# Patient Record
Sex: Female | Born: 1945 | Race: White | Hispanic: No | Marital: Married | Smoking: Former smoker
Health system: Southern US, Community
[De-identification: ages and names within clinical notes are randomized; demographics above are authoritative.]

## PROBLEM LIST (undated history)

## (undated) DIAGNOSIS — G4730 Sleep apnea, unspecified: Secondary | ICD-10-CM

## (undated) DIAGNOSIS — M1990 Unspecified osteoarthritis, unspecified site: Secondary | ICD-10-CM

## (undated) DIAGNOSIS — K5792 Diverticulitis of intestine, part unspecified, without perforation or abscess without bleeding: Secondary | ICD-10-CM

## (undated) DIAGNOSIS — I10 Essential (primary) hypertension: Secondary | ICD-10-CM

## (undated) DIAGNOSIS — J189 Pneumonia, unspecified organism: Secondary | ICD-10-CM

## (undated) DIAGNOSIS — E049 Nontoxic goiter, unspecified: Secondary | ICD-10-CM

## (undated) DIAGNOSIS — C50919 Malignant neoplasm of unspecified site of unspecified female breast: Secondary | ICD-10-CM

## (undated) DIAGNOSIS — D369 Benign neoplasm, unspecified site: Secondary | ICD-10-CM

## (undated) DIAGNOSIS — F99 Mental disorder, not otherwise specified: Secondary | ICD-10-CM

## (undated) DIAGNOSIS — E039 Hypothyroidism, unspecified: Secondary | ICD-10-CM

## (undated) DIAGNOSIS — K219 Gastro-esophageal reflux disease without esophagitis: Secondary | ICD-10-CM

## (undated) HISTORY — DX: Malignant neoplasm of unspecified site of unspecified female breast: C50.919

## (undated) HISTORY — DX: Nontoxic goiter, unspecified: E04.9

## (undated) HISTORY — DX: Diverticulitis of intestine, part unspecified, without perforation or abscess without bleeding: K57.92

## (undated) HISTORY — DX: Benign neoplasm, unspecified site: D36.9

## (undated) HISTORY — PX: CHOLECYSTECTOMY: SHX55

## (undated) HISTORY — PX: MASTECTOMY: SHX3

## (undated) HISTORY — PX: BREAST SURGERY: SHX581

## (undated) HISTORY — PX: ABDOMINAL HYSTERECTOMY: SHX81

## (undated) HISTORY — PX: DILATION AND CURETTAGE OF UTERUS: SHX78

## (undated) HISTORY — PX: HERNIA REPAIR: SHX51

## (undated) HISTORY — DX: Essential (primary) hypertension: I10

## (undated) HISTORY — DX: Gastro-esophageal reflux disease without esophagitis: K21.9

## (undated) HISTORY — DX: Mental disorder, not otherwise specified: F99

## (undated) HISTORY — PX: PARTIAL HYSTERECTOMY: SHX80

---

## 2001-08-20 ENCOUNTER — Other Ambulatory Visit: Admission: RE | Admit: 2001-08-20 | Discharge: 2001-08-20 | Payer: Self-pay | Admitting: Dermatology

## 2002-06-13 DIAGNOSIS — C50919 Malignant neoplasm of unspecified site of unspecified female breast: Secondary | ICD-10-CM

## 2002-06-13 HISTORY — DX: Malignant neoplasm of unspecified site of unspecified female breast: C50.919

## 2002-06-13 HISTORY — PX: OTHER SURGICAL HISTORY: SHX169

## 2002-09-04 ENCOUNTER — Encounter (INDEPENDENT_AMBULATORY_CARE_PROVIDER_SITE_OTHER): Payer: Self-pay | Admitting: *Deleted

## 2002-09-04 ENCOUNTER — Encounter: Admission: RE | Admit: 2002-09-04 | Discharge: 2002-09-04 | Payer: Self-pay | Admitting: Internal Medicine

## 2002-09-04 ENCOUNTER — Other Ambulatory Visit: Admission: RE | Admit: 2002-09-04 | Discharge: 2002-09-04 | Payer: Self-pay | Admitting: Radiology

## 2002-09-04 ENCOUNTER — Encounter: Payer: Self-pay | Admitting: Internal Medicine

## 2002-09-16 ENCOUNTER — Encounter (HOSPITAL_COMMUNITY): Admission: RE | Admit: 2002-09-16 | Discharge: 2002-12-15 | Payer: Self-pay | Admitting: *Deleted

## 2002-09-16 ENCOUNTER — Encounter: Payer: Self-pay | Admitting: *Deleted

## 2002-09-17 ENCOUNTER — Encounter: Payer: Self-pay | Admitting: *Deleted

## 2002-10-04 ENCOUNTER — Ambulatory Visit (HOSPITAL_BASED_OUTPATIENT_CLINIC_OR_DEPARTMENT_OTHER): Admission: RE | Admit: 2002-10-04 | Discharge: 2002-10-04 | Payer: Self-pay | Admitting: *Deleted

## 2002-10-04 ENCOUNTER — Encounter: Admission: RE | Admit: 2002-10-04 | Discharge: 2002-10-04 | Payer: Self-pay | Admitting: *Deleted

## 2002-10-04 ENCOUNTER — Encounter (INDEPENDENT_AMBULATORY_CARE_PROVIDER_SITE_OTHER): Payer: Self-pay | Admitting: *Deleted

## 2002-10-04 ENCOUNTER — Encounter: Payer: Self-pay | Admitting: *Deleted

## 2002-10-22 ENCOUNTER — Ambulatory Visit: Admission: RE | Admit: 2002-10-22 | Discharge: 2002-12-04 | Payer: Self-pay | Admitting: *Deleted

## 2002-10-25 ENCOUNTER — Ambulatory Visit: Admission: RE | Admit: 2002-10-25 | Discharge: 2002-10-25 | Payer: Self-pay | Admitting: Oncology

## 2002-10-25 ENCOUNTER — Encounter: Payer: Self-pay | Admitting: Cardiology

## 2002-10-31 ENCOUNTER — Encounter: Payer: Self-pay | Admitting: General Surgery

## 2002-10-31 ENCOUNTER — Ambulatory Visit (HOSPITAL_BASED_OUTPATIENT_CLINIC_OR_DEPARTMENT_OTHER): Admission: RE | Admit: 2002-10-31 | Discharge: 2002-10-31 | Payer: Self-pay | Admitting: General Surgery

## 2003-05-16 ENCOUNTER — Inpatient Hospital Stay (HOSPITAL_COMMUNITY): Admission: RE | Admit: 2003-05-16 | Discharge: 2003-05-17 | Payer: Self-pay | Admitting: General Surgery

## 2003-05-16 ENCOUNTER — Encounter (INDEPENDENT_AMBULATORY_CARE_PROVIDER_SITE_OTHER): Payer: Self-pay | Admitting: Specialist

## 2003-12-22 ENCOUNTER — Encounter: Admission: RE | Admit: 2003-12-22 | Discharge: 2003-12-22 | Payer: Self-pay | Admitting: Neurosurgery

## 2004-01-06 ENCOUNTER — Encounter: Admission: RE | Admit: 2004-01-06 | Discharge: 2004-01-06 | Payer: Self-pay | Admitting: Neurosurgery

## 2004-07-27 ENCOUNTER — Encounter: Admission: RE | Admit: 2004-07-27 | Discharge: 2004-07-27 | Payer: Self-pay | Admitting: General Surgery

## 2004-08-05 ENCOUNTER — Encounter: Admission: RE | Admit: 2004-08-05 | Discharge: 2004-08-05 | Payer: Self-pay | Admitting: General Surgery

## 2006-01-27 ENCOUNTER — Ambulatory Visit: Payer: Self-pay | Admitting: Cardiology

## 2006-06-13 HISTORY — PX: ESOPHAGOGASTRODUODENOSCOPY: SHX1529

## 2006-06-13 HISTORY — PX: COLONOSCOPY: SHX174

## 2006-09-22 ENCOUNTER — Ambulatory Visit: Payer: Self-pay | Admitting: Internal Medicine

## 2006-09-22 ENCOUNTER — Ambulatory Visit (HOSPITAL_COMMUNITY): Admission: RE | Admit: 2006-09-22 | Discharge: 2006-09-22 | Payer: Self-pay | Admitting: Internal Medicine

## 2009-06-16 ENCOUNTER — Encounter (INDEPENDENT_AMBULATORY_CARE_PROVIDER_SITE_OTHER): Payer: Self-pay | Admitting: *Deleted

## 2009-07-30 ENCOUNTER — Emergency Department (HOSPITAL_COMMUNITY): Admission: EM | Admit: 2009-07-30 | Discharge: 2009-07-30 | Payer: Self-pay | Admitting: Emergency Medicine

## 2010-07-13 NOTE — Letter (Signed)
Summary: 1st Missed Appt.  [facility]  [address]   [place], [place] [address]   Phone: [phone]  Fax: [phone]    June 16, 2009  MRN: [id number]  Karen [name] [address] [place], [place]  [address]  Dear [name],  At [facility], we make every attempt to fit patients into our schedule by reserving several appointment slots for same-day appointments.  However, we cannot always make appointments for patients the same day they are calling.  At the end of the day, we look back at our schedule and find that because of last-minute cancellations and patients not showing up for their scheduled appointments, we have several appointment slots that are left open and could have been used by another person who really needed it.  In the past, you may have been one of the patients who could not get in when you needed to.  But recently, you were one of the patients with an appointment that you didn't show up for or canceled too late for [place] to fill it.  We choose not to charge no-show or last minute cancellation fees to our patients, like many other offices do.  We do not wish to institute that policy and hope we never have to.  However, we kindly request that you assist [place] by providing at least 24 hours' notice if you can't make your appointment.  If no-shows or late cancellations become habitual (i.e. Three or more in a one-year period), we may terminate the physician-patient relationship.    Thank you for your consideration and cooperation.   Altamease Oiler

## 2010-08-16 ENCOUNTER — Emergency Department (HOSPITAL_COMMUNITY): Payer: Self-pay

## 2010-08-16 ENCOUNTER — Emergency Department (HOSPITAL_COMMUNITY)
Admission: EM | Admit: 2010-08-16 | Discharge: 2010-08-16 | Disposition: A | Discharge status: Home / Self Care | Payer: Self-pay | Attending: Emergency Medicine | Admitting: Emergency Medicine

## 2010-08-16 DIAGNOSIS — E039 Hypothyroidism, unspecified: Secondary | ICD-10-CM | POA: Insufficient documentation

## 2010-08-16 DIAGNOSIS — R197 Diarrhea, unspecified: Secondary | ICD-10-CM | POA: Insufficient documentation

## 2010-08-16 DIAGNOSIS — K5732 Diverticulitis of large intestine without perforation or abscess without bleeding: Secondary | ICD-10-CM | POA: Insufficient documentation

## 2010-08-16 DIAGNOSIS — Z853 Personal history of malignant neoplasm of breast: Secondary | ICD-10-CM | POA: Insufficient documentation

## 2010-08-16 DIAGNOSIS — Z79899 Other long term (current) drug therapy: Secondary | ICD-10-CM | POA: Insufficient documentation

## 2010-08-16 DIAGNOSIS — I10 Essential (primary) hypertension: Secondary | ICD-10-CM | POA: Insufficient documentation

## 2010-08-16 DIAGNOSIS — R110 Nausea: Secondary | ICD-10-CM | POA: Insufficient documentation

## 2010-08-16 LAB — CBC
HCT: 37.3 % (ref 36.0–46.0)
Hemoglobin: 12.4 g/dL (ref 12.0–15.0)
MCH: 31 pg (ref 26.0–34.0)
MCHC: 33.2 g/dL (ref 30.0–36.0)
MCV: 93.3 fL (ref 78.0–100.0)
RBC: 4 MIL/uL (ref 3.87–5.11)

## 2010-08-16 LAB — URINALYSIS, ROUTINE W REFLEX MICROSCOPIC
Glucose, UA: NEGATIVE mg/dL
Hgb urine dipstick: NEGATIVE
Ketones, ur: NEGATIVE mg/dL
Nitrite: NEGATIVE
Protein, ur: NEGATIVE mg/dL
Specific Gravity, Urine: >1.03 — ABNORMAL HIGH (ref 1.005–1.030)
Urobilinogen, UA: 0.2 mg/dL (ref 0.0–1.0)
pH: 5.5 (ref 5.0–8.0)

## 2010-08-16 LAB — COMPREHENSIVE METABOLIC PANEL
Albumin: 3.8 g/dL (ref 3.5–5.2)
Alkaline Phosphatase: 111 U/L (ref 39–117)
BUN: 14 mg/dL (ref 6–23)
Calcium: 9.3 mg/dL (ref 8.4–10.5)
Glucose, Bld: 95 mg/dL (ref 70–99)
Potassium: 3.3 mEq/L — ABNORMAL LOW (ref 3.5–5.1)
Sodium: 138 mEq/L (ref 135–145)
Total Protein: 7.5 g/dL (ref 6.0–8.3)

## 2010-08-16 LAB — DIFFERENTIAL
Lymphs Abs: 2.3 10*3/uL (ref 0.7–4.0)
Monocytes Absolute: 0.8 10*3/uL (ref 0.1–1.0)
Monocytes Relative: 8 % (ref 3–12)
Neutro Abs: 7.3 10*3/uL (ref 1.7–7.7)
Neutrophils Relative %: 69 % (ref 43–77)

## 2010-08-16 MED ORDER — IOHEXOL 300 MG/ML  SOLN
100.0000 mL | Freq: Once | INTRAMUSCULAR | Status: AC | PRN
  Administered 2010-08-16: 100 mL via INTRAVENOUS

## 2010-08-30 ENCOUNTER — Encounter: Payer: Self-pay | Admitting: Gastroenterology

## 2010-08-30 ENCOUNTER — Ambulatory Visit (INDEPENDENT_AMBULATORY_CARE_PROVIDER_SITE_OTHER): Payer: Self-pay | Admitting: Gastroenterology

## 2010-08-30 DIAGNOSIS — K219 Gastro-esophageal reflux disease without esophagitis: Secondary | ICD-10-CM

## 2010-08-30 DIAGNOSIS — K5732 Diverticulitis of large intestine without perforation or abscess without bleeding: Secondary | ICD-10-CM

## 2010-09-01 LAB — BASIC METABOLIC PANEL
BUN: 13 mg/dL (ref 6–23)
CO2: 32 mEq/L (ref 19–32)
Calcium: 8.9 mg/dL (ref 8.4–10.5)
Chloride: 99 mEq/L (ref 96–112)
Creatinine, Ser: 0.65 mg/dL (ref 0.4–1.2)
Glucose, Bld: 110 mg/dL — ABNORMAL HIGH (ref 70–99)

## 2010-09-01 LAB — DIFFERENTIAL
Basophils Absolute: 0 10*3/uL (ref 0.0–0.1)
Basophils Relative: 1 % (ref 0–1)
Eosinophils Absolute: 0 10*3/uL (ref 0.0–0.7)
Monocytes Relative: 13 % — ABNORMAL HIGH (ref 3–12)
Neutro Abs: 1.7 10*3/uL (ref 1.7–7.7)
Neutrophils Relative %: 47 % (ref 43–77)

## 2010-09-01 LAB — CBC
MCHC: 34.6 g/dL (ref 30.0–36.0)
MCV: 93.8 fL (ref 78.0–100.0)
Platelets: 169 10*3/uL (ref 150–400)
RBC: 4.15 MIL/uL (ref 3.87–5.11)
RDW: 13.3 % (ref 11.5–15.5)

## 2010-09-03 ENCOUNTER — Encounter: Payer: Self-pay | Admitting: Gastroenterology

## 2010-09-09 NOTE — Assessment & Plan Note (Signed)
Summary: ER REFERRAL FOR DIVERTICULITIS   Vital Signs:  Patient profile:   64 year old female Height:      63 inches Weight:      197.75 pounds BMI:     35.16 Temp:     98.6 degrees F oral Pulse rate:   80 / minute BP sitting:   126 / 86  (right arm)  Vitals Entered By: Carolan Clines LPN (August 30, 2010 1:59 PM)  Visit Type:  Initial Consult Referring Provider:  [facility] Primary Care Provider:  Dr. Sherryll Burger   History of Present Illness: Karen [name] is a pleasant 65 year old Caucasian female who presents today in f/u from diverticulitis, diagnosed at [facility] ED on 08/16/10. She has completed a course of Cipro/Flagyl X 10 days. Pt reports feeling better, pain has lessened in severity. +belching X 1 week. +GERD. Not on a PPI. Occasional dysphagia with tougher textures X 1 year. No epigastric pain. Denies fever/chills.  BMs are loose. ranging from 2-4X per day. No more melena or brbpr since starting abx.   Last EGD/TCS April 2008: lipoma distal esophagus, gastritis, colon with few diverticula in sigmoid colon  Current Medications (verified): 1)  Verapamil Hcl Cr 180 Mg Cr-Tabs (Verapamil Hcl) .... Take One Once Daily 2)  Enalapril-Hydrochlorothiazide 10-25 Mg Tabs (Enalapril-Hydrochlorothiazide) .... Take One Once Daily 3)  Hydrocodone-Acetaminophen 5-325 Mg Tabs (Hydrocodone-Acetaminophen) .... Take One Q4-6hrs As Needed For Pain 4)  Ondansetron Hcl 8 Mg Tabs (Ondansetron Hcl) .... Dissolve One Under Tongue Qid As Needed  Allergies (verified): 1)  ! * Sulfa  Past History:  Past Medical History: GERD HTN Goiter Breast ca 2004  EGD/TCS April 2008: lipoma distal esophagus, gastritis, colon with few diverticula in sigmoid colon  Past Surgical History: Partial hysterectomy Bilateral mastectomy with reconstruction  Family History: Mother: deceased, pancreatic ca Father: deceased, aortic aneurysm No FH of Colon Cancer:  Social History: Occupation: Retired Married X 50 years 3  children Patient is a former smoker. quit 15 years ago Alcohol Use - no Smoking Status:  quit  Review of Systems General:  Denies fever, chills, and anorexia. Eyes:  Denies blurring, irritation, and discharge. ENT:  Complains of difficulty swallowing; denies sore throat and hoarseness. CV:  Denies chest pains and syncope. Resp:  Denies dyspnea at rest and wheezing. GI:  See HPI. GU:  Denies urinary burning and urinary frequency. MS:  Denies joint pain / LOM, joint swelling, and joint stiffness. Derm:  Denies rash, itching, and dry skin. Neuro:  Denies weakness and syncope. Psych:  Denies depression and anxiety. Endo:  Denies cold intolerance and heat intolerance. Heme:  Denies bruising and bleeding.  Physical Exam  General:  Well developed, well nourished, no acute distress. Head:  Normocephalic and atraumatic. Eyes:  PERRLA, no icterus. Mouth:  No deformity or lesions, dentition normal. Lungs:  expiratory wheeze bilaterally. no rales or rhonchi. unlabored.  Heart:  Regular rate and rhythm; no murmurs, rubs,  or bruits. Abdomen:  +BS, obese. mildly TTP LLQ. No rebound or guarding. no HSM noted. no masses noted. Msk:  Symmetrical with no gross deformities. Normal posture. Extremities:  No clubbing, cyanosis, edema or deformities noted. Neurologic:  Alert and  oriented x4;  grossly normal neurologically. Skin:  Intact without significant lesions or rashes. Psych:  Alert and cooperative. Normal mood and affect.   Impression & Recommendations:  Problem # 1:  DIVERTICULITIS-COLON (ICD-562.11)  1st documented episode of diverticulitis August 16, 2010. Finished course of Cipro/Flagyl, with significant improvement of  symptoms. No fever/chills. 2-4 loose stools per day. No melena or brbpr since ED visit. Last colonoscopy in 2008 with sigmoid diverticula.   Continue low-residue diet X 1 week, then advance to high-fiber diet F/U in 3-4 weeks to schedule outpatient colonoscopy Avoid  constipation, use stool softeners as needed  Orders: Consultation Level III (13244)  Problem # 2:  GERD (ICD-530.81)  Hx of GERD, gastritis, not currently on PPI. +GERD with intermittent dysphagia X 1 year. May be due to uncontrolled GERD. Will trial Dexilant. When returns for visit, will visit need for upper EGD as well with TCS.   Dexilant # 10 samples with savings card and prescription F/U in 3-4 weeks Consider upper EGD if symptoms continue despite Dexilant  Orders: Consultation Level III (01027)  Patient Instructions: 1)  Follow low-residue diet for an additional week, then advance to high-fiber diet.  2)  Avoid constipation. May use stool softeners as needed.  3)  Return in 3-4 weeks to set up colonoscopy to evaluate lower GI tract. 4)  Start Dexilant 60 mg daily. For reflux. Samples have been given for 10 days. Activate savings card, THEN fill prescription. 5)  The medication list was reviewed and reconciled.  All changed / newly prescribed medications were explained.  A complete medication list was provided to the patient / caregiver.  Prescriptions: DEXILANT 60 MG CPDR (DEXLANSOPRAZOLE) take 1 by mouth daily  #31 x 3   Entered and Authorized by:   Gerrit Halls NP   Signed by:   Gerrit Halls NP on 08/30/2010   Method used:   Print then Give to Patient   RxID:   [id number]    Orders Added: 1)  Consultation Level III [63875]  Appended Document: ER REFERRAL FOR DIVERTICULITIS 3-4 wks f/u opv is in the computer

## 2010-09-27 ENCOUNTER — Ambulatory Visit: Payer: Self-pay | Admitting: Gastroenterology

## 2010-10-26 ENCOUNTER — Encounter: Payer: Self-pay | Admitting: Gastroenterology

## 2010-10-27 ENCOUNTER — Ambulatory Visit: Payer: Self-pay | Admitting: Gastroenterology

## 2010-10-29 NOTE — Op Note (Signed)
NAMEJAENA, Karen [name]                      ACCOUNT NO.:  [id number]   MEDICAL RECORD NO.:  [id number]                   PATIENT TYPE:  INP   LOCATION:  0443                                 FACILITY:  [facility]   PHYSICIAN:  Timothy E. Earlene Plater, M.D.              DATE OF BIRTH:  26-Feb-1946   DATE OF PROCEDURE:  05/16/2003  DATE OF DISCHARGE:                                 OPERATIVE REPORT   PREOPERATIVE DIAGNOSES:  Carcinoma left breast, strong family history of  breast cancer, placement of Port-A-Cath.   POSTOPERATIVE DIAGNOSES:  Carcinoma left breast, strong family history of  breast cancer, placement of Port-A-Cath.   PROCEDURE:  Removal of Port-A-Cath, bilateral simple mastectomy.   SURGEON:  Timothy E. Earlene Plater, M.D.   ASSISTANT:  Abigail Miyamoto, M.D.   INDICATIONS FOR PROCEDURE:  Karen [name] is 42, was diagnosed earlier this year  with left breast cancer, stage 2A with positive nodes.  She has completed  chemotherapy. Because of two sisters and aunt with breast cancer, she has  decided that she would have bilateral mastectomies.  Dr. Margo Common, her  primary care, Dr. Orvilla Fus price and myself have all agreed.  She will have  reconstruction later with Dr. Stephens November.  The patient has a history of  hypertension and reflux and obesity, low thyroid.  She was seen, evaluated  by anesthesia, identified and the permit signed.   Her laboratory data today were satisfactory.   DESCRIPTION OF PROCEDURE:  She was taken to the operating room, placed  supine, positioned herself with arms outstretched and then general  endotracheal anesthesia was administered.  The chest had been previously  marked in the upright position with the midline, the sternal notch, previous  biopsy and lobectomy site left upper outer quadrant.  Now the breast was  again marked midline, inframammary folds, horizontal line of the nipples and  previous biopsies.  The ellipse of the left breast was designed to enclose  the scar from previous lumpectomy.  The right ellipse was designed to match  the left.  Both were designed as short horizontal incisions to allow as much  redundant skin remain as possible.  This having been done, the chest was  prepped and draped in the usual fashion.  We started with the right breast  and the elliptical incision was made including the nipple. Flaps were raised  in each direction and I did encounter the Port-A-Cath pocket at the top of  the right breast tissue.  I therefore entered the pocket and removed the  Port-A-Cath and sutures at this time.  The margins of the breast were  ascertained during this dissection superior, inferior, medial and lateral  and then the breast was removed from medial to lateral with care taken to  leave the prepectoral fascia.  This was accomplished with removal of all the  breast tissue.  The breast was marked and sent to pathology for weight and  histologic analysis.   The left breast was now approached with the designed ellipse as noted above.  That ellipse was incised and then flaps were raised in all directions as  previously.  The entire limits of the breast were reached and this did  include the lower portion of the prior axillary dissection.  The prior  lumpectomy site was not entered. The breast was removed from medial to  lateral. At the depth of the previous lumpectomy site the scar tissue in the  prepectoral fascia and fascial muscle were removed with the specimen.  Likewise this breast was marked and sent to pathology.  Bleeding was  controlled throughout with cautery and/or 4-0 Vicryl sutures. Both wounds  were dry, irrigation was clear.  All counts were correct.  One single 10 mm  flat drain was placed in each wound and brought out through the inferior  stab wounds and these were sutured the skin.  The wounds were then closed  with a single layer with wide skin staples.  She had tolerated it well,  blood loss was approximately  100 mL and she was stable to completion.  With  the counts correct, bandages applied, the patient was extubated and taken to  the recovery room in good condition.                                               Timothy E. Earlene Plater, M.D.    TED/MEDQ  D:  05/16/2003  T:  05/16/2003  Job:  [id number]   cc:   Lynett Fish, M.D.  [address] [place]  [place] [address]  Fax: [phone]

## 2010-10-29 NOTE — Op Note (Signed)
Karen [name], Karen [name]                      ACCOUNT NO.:  [id number]   MEDICAL RECORD NO.:  [id number]                   PATIENT TYPE:  AMB   LOCATION:  DSC                                  FACILITY:  [facility]   PHYSICIAN:  Maisie Fus B. Samuella Cota, M.D.               DATE OF BIRTH:  1945/12/15   DATE OF PROCEDURE:  10/04/2002  DATE OF DISCHARGE:  10/04/2002                                 OPERATIVE REPORT   CCS# [id number].   PREOPERATIVE DIAGNOSIS:  Invasive ductal carcinoma, left breast.   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   OPERATION PERFORMED:  Needle localization and partial mastectomy, left,  injection of blue dye, sentinel lymph node dissection, full left axillary  lymph node dissection.   SURGEON:  Maisie Fus B. Samuella Cota, M.D.   ANESTHESIA:  General. Anesthesiologist and CRNA.   DESCRIPTION OF PROCEDURE:  The patient was taken to the operating room and  placed on the table in supine position.  After satisfactory general  anesthetic with intubation, the left breast and axilla was prepped and  draped as a sterile field.  Prior to induction of general anesthetic  approximately 3mL of isosulfan blue was injected in the subareolar area of  the left breast and massaged for about five minutes.  The patient had a wire  coming through the skin at about the 2 o'clock position which was pointing  medially.  An area over the breast at the 11:30 position was marked.  The  wire was going through a tumor which was 2.3 cm deep to the nipple and  areola.  The gamma probe was placed over the left axilla and a hot spot  noted.  This was marked and a transverse incision in the skin crease of the  lower axilla.  This dissection was taken down through skin and subcutaneous  tissue.  The pectoralis major and minor muscles were retracted medially and  there was a hot blue node noted.  This was dissected free with  bleeders  being controlled with the cautery or with Hemoclips.  This was removed and  placed over the gamma probe and it was quite hot.  The probe was placed back  into the wound and there was another hot node which was dissected free.  This also was a fairly small node, only about 1 cm in size and was hot but  was not blue.  Following removal of these two nodes, there was no further  activity in the axilla.  The wound was packed open and the two sentinel  nodes were sent for Touch Preps.  While this was being done a transverse  incision was outlined with a skin marker so as to remove the opening in the  skin made by the wire.  This was the lateral border of the incision which  extended across to about the 11  o'clock area.  A small ellipse of skin,  perhaps 4 to 5 mm in diameter was removed with the specimen.  The dissection  was taken down to the chest wall and was taken across the 12 o'clock  position to about the 11 o'clock position.  This was sent to the pathologist  for implants.  Bleeding was controlled with the cautery or with sutures of 3-  0 Vicryl.  This wound was then packed open.  At about this time, the  pathologist reported that the first sentinel lymph node which was hot and  blue showed malignant cells.  With this information, the axillary incision  was extended both medially and laterally and axillary dissection carried  out.  The pectoralis major and minor muscles were retracted medially and the  axilla entered.  The vessels coming off inferior to the axillary vein were  doubly clipped and divided.  The long thoracic nerve of Bell and  thoracodorsal artery, vein and nerve were seen and preserved.  The patient  did have some fairly firm nodes in the axilla.  The resection was taken out  laterally where it was divided off of the latissimus dorsi muscle.  The  wound was irrigated.  Hemostasis was obtained.  At this point the  pathologist reported that the imprints on the  breast tissue itself showed  good margins except a questionable margin inferiorly.  With that  information, I went back and removed tissue inferior to my incision  extending from the skin down to the chest wall.  The wound was then  irrigated and hemostasis obtained.  The breast incision was closed with  subcuticular sutures of 3-0 Vicryl.  The skin was closed with skin staples.  A #19 Blake drain was inserted into the left axillary wound through a stab  wound inferior and lateral to the axillary incision.  It was sutured to the  skin using 3-0 nylon.  Subcutaneous tissue was approximated with 3-0 Vicryl  and the skin was closed also with skin staples.  A piece of Vaseline gauze  was placed over the suture line in the axilla and also around the [place]  drain.  Dry sterile dressing using 4 x 4s, ABD and four-inch HypaFix was  applied.  The drain was connected to bulb suction.  The patient seemed to  tolerate the procedure well and was taken to the PACU in satisfactory  condition.                                                Thomas B. Samuella Cota, M.D.    TBP/MEDQ  D:  10/04/2002  T:  10/07/2002  Job:  [id number]   cc:   Kirstie Peri  [address]  [place] [address]  Fax: [phone]

## 2010-10-29 NOTE — Op Note (Signed)
[name], [name]                 ACCOUNT NO.:  [id number]   MEDICAL RECORD NO.:  [id number]          PATIENT TYPE:  AMB   LOCATION:  DAY                           FACILITY:  [facility]   PHYSICIAN:  Lionel December, M.D.    DATE OF BIRTH:  1946/05/05   DATE OF PROCEDURE:  09/22/2006  DATE OF DISCHARGE:                               OPERATIVE REPORT   PROCEDURE:  Esophagogastroduodenoscopy followed by colonoscopy.   Karen [name] is a 65 year old Caucasian female who has a several year history  of reflux esophagitis controlled with a PPI.  She is undergoing EGD to  make sure she does not have Barrett's esophagus.  She is also undergoing  average-risk screening colonoscopy.  Last exam was 10 years ago.  Family  history is significant for breast carcinoma and she also has had  bilateral mastectomy and remains in remission.  The procedure and risks  were reviewed with the patient and informed consent was obtained.   MEDS FOR CONSCIOUS SEDATION:  1. Benzocaine for oropharyngeal topical anesthesia.  2. Demerol 50 mg IV.  3. Versed 8 mg IV.   FINDINGS:  Procedure performed in endoscopy suite.  The patient's vital  signs and O2 sat were monitored during the procedure and remained  stable.   PROCEDURE:  1. Esophagogastroduodenoscopy.  The patient was placed in the left      lateral recumbent position and the Pentax video scope was passed      into the oropharynx without any difficulty into the esophagus.   Esophagus - mucosa of the esophagus normal.  Distally, there was a 5 mm  submucosal lesion just proximal to the GE junction suspicious for small  lipoma.  The GE junction was at 38 cm from the incisors and was  unremarkable.   Stomach - it was empty and distended very well with insufflation.  Folds  of the proximal stomach are normal.  There was one pill in her stomach,  though.  Examination of the mucosa revealed linear erythema at antrum  with acute tiny erosions.  The pyloric channel was  patent.  Annulus,  fundus and cardiac were examined by retroflexion of the scope and were  normal.   Duodenum - bulbar mucosa was normal.  Scope was passed to the second  part of the jejunum.  Mucosa and folds are normal.  Endoscope was  withdrawn and the patient was prepared for procedure #2.   1. Colonoscopy.  A rectal examination was performed.  No abnormality      noted on external or digital exam.  Pentax video scope was placed      in the rectum and advanced visually into the sigmoid colon and      beyond.  She had few diverticula in the proximal sigmoid colon.      Preparation was satisfactory except she had some stool in her      ascending colon, which had to be washed and suctioned out.  The      scope was passed into the cecum, which was identified by the      appendiceal  orifice and the ileocecal valve.  Pictures were taken      for the record.  As the scope was withdrawn, colonic mucosa was      carefully examined and there were no polyps or other mucosal      abnormalities.  Rectal mucosa similarly was normal.  The scope was      retroflexed to examine anorectal junction.  There was some      thickening to mucosa of the anal canal but no hemorrhoids noted.      Endoscope was straightened and withdrawn.  The patient tolerated      the procedure well.   DIAGNOSIS:  1. A 5 mm submucosal lesion in the distal esophagus, consistent with      lipoma.  2. No evidence of Barrett's esophagus or erosive esophagitis.  3. Erosive antral gastritis.  4. Normal colonoscopy except few diverticula at sigmoid colon.   RECOMMENDATIONS:  1. She will continue antireflux measures and Prilosec, as before.  2. H. pylori serology to be checked.  3. High fiber diet.  4. She should continue yearly hemoccults and consider next screening      exam in 10 years from now.      Lionel December, M.D.  Electronically Signed     NR/MEDQ  D:  09/22/2006  T:  09/22/2006  Job:  [id number]   cc:    Ruthy Dick  Fax: [phone]

## 2010-10-29 NOTE — Op Note (Signed)
NAMEKAYDANCE, BOWIE                      ACCOUNT NO.:  [id number]   MEDICAL RECORD NO.:  [id number]                   PATIENT TYPE:  AMB   LOCATION:  DSC                                  FACILITY:  [facility]   PHYSICIAN:  Timothy E. Earlene Plater, M.D.              DATE OF BIRTH:  07-03-1945   DATE OF PROCEDURE:  10/31/2002  DATE OF DISCHARGE:                                 OPERATIVE REPORT   PREOPERATIVE DIAGNOSIS:  Carcinoma, left breast.   POSTOPERATIVE DIAGNOSIS:  Carcinoma, left breast.   PROCEDURE:  Placement of Port-A-Cath.   SURGEON:  Timothy E. Earlene Plater, M.D.   ANESTHESIA:  Local standby.   CLINICAL HISTORY:  The patient was recently history by Maisie Fus B. Samuella Cota, M.D.  She is 66, a Caucasian female, with carcinoma of the left breast.  Has  decided because of such strong family history that she will have bilateral  mastectomies and reconstruction but prior to that she will receive  chemotherapy, Valentino Hue. Magrinat, M.D.  For that she will need a Port-A-  Cath, and she agrees and understands with the placement.   DESCRIPTION OF PROCEDURE:  She was taken to the operating room, placed  supine, with the mid-back supported, IV sedation given.  She was placed in  the head-down position and the upper chest and neck were prepped and draped  in the usual fashion.  Marcaine 0.25% with epinephrine was used prior to  each stick or incision.  The right subclavian vein was isolated on the first  pass, a guidewire threaded, and all during the procedure real-time  fluoroscopy used for accurate placement.  The inserting needle was removed.  The placement skin mark was enlarged, and the introduction sheath was placed  over the guidewire.  Guidewire removed.  The catheter to the Port-A-Cath  device threaded through the sheath and the sheath removed.  Again  fluoroscopy used for accurate placement.  Good position was obtained.  The  patient was brought up to the neutral position and then with  additional  local anesthesia, a pocket was made in the mid-right chest for placement of  port.  Catheter was subcutaneously tunneled to the port and pectorally  connected to the port.  It flushed and irrigated well.  The port sutured to  the prepectoral fascia with 2-0 Prolene.  The wounds were all closed with 3-  0 Monocryl.  Steri-Strips applied.  The port isolated percutaneously,  flushed and irrigated well, and then flushed with concentrated heparin  solution.  Steri-Strips and  OpSite dressing applied and the accurate puncture mark marked on the skin.  She tolerated it well, counts correct, and she is removed to the recovery  room in good condition.  A portable chest x-ray will be made and forwarded  to Dr. Darnelle Catalan, and the patient will be seen and followed as an outpatient.  Timothy E. Earlene Plater, M.D.    TED/MEDQ  D:  10/31/2002  T:  11/01/2002  Job:  [id number]   cc:   Valentino Hue. Magrinat, M.D.  [address] [facility]  [place]  [place] [address]  Fax: [phone]

## 2011-01-24 ENCOUNTER — Encounter: Payer: Self-pay | Admitting: Gastroenterology

## 2011-01-24 ENCOUNTER — Ambulatory Visit (HOSPITAL_COMMUNITY)
Admission: RE | Admit: 2011-01-24 | Discharge: 2011-01-24 | Disposition: A | Discharge status: Home / Self Care | Payer: Medicare Other | Source: Ambulatory Visit | Attending: Gastroenterology | Admitting: Gastroenterology

## 2011-01-24 ENCOUNTER — Encounter (HOSPITAL_COMMUNITY): Payer: Self-pay

## 2011-01-24 ENCOUNTER — Ambulatory Visit (INDEPENDENT_AMBULATORY_CARE_PROVIDER_SITE_OTHER): Payer: Medicare Other | Admitting: Gastroenterology

## 2011-01-24 VITALS — BP 141/90 | HR 73 | Temp 96.6°F | Ht 62.0 in | Wt 205.8 lb

## 2011-01-24 DIAGNOSIS — R1310 Dysphagia, unspecified: Secondary | ICD-10-CM | POA: Insufficient documentation

## 2011-01-24 DIAGNOSIS — K5732 Diverticulitis of large intestine without perforation or abscess without bleeding: Secondary | ICD-10-CM

## 2011-01-24 DIAGNOSIS — R110 Nausea: Secondary | ICD-10-CM | POA: Insufficient documentation

## 2011-01-24 DIAGNOSIS — R1032 Left lower quadrant pain: Secondary | ICD-10-CM | POA: Insufficient documentation

## 2011-01-24 DIAGNOSIS — K219 Gastro-esophageal reflux disease without esophagitis: Secondary | ICD-10-CM | POA: Insufficient documentation

## 2011-01-24 DIAGNOSIS — K7689 Other specified diseases of liver: Secondary | ICD-10-CM | POA: Insufficient documentation

## 2011-01-24 DIAGNOSIS — Q619 Cystic kidney disease, unspecified: Secondary | ICD-10-CM | POA: Insufficient documentation

## 2011-01-24 DIAGNOSIS — I10 Essential (primary) hypertension: Secondary | ICD-10-CM | POA: Insufficient documentation

## 2011-01-24 DIAGNOSIS — K5730 Diverticulosis of large intestine without perforation or abscess without bleeding: Secondary | ICD-10-CM | POA: Insufficient documentation

## 2011-01-24 DIAGNOSIS — Z853 Personal history of malignant neoplasm of breast: Secondary | ICD-10-CM | POA: Insufficient documentation

## 2011-01-24 DIAGNOSIS — K429 Umbilical hernia without obstruction or gangrene: Secondary | ICD-10-CM | POA: Insufficient documentation

## 2011-01-24 DIAGNOSIS — K625 Hemorrhage of anus and rectum: Secondary | ICD-10-CM | POA: Insufficient documentation

## 2011-01-24 DIAGNOSIS — R197 Diarrhea, unspecified: Secondary | ICD-10-CM | POA: Insufficient documentation

## 2011-01-24 LAB — BASIC METABOLIC PANEL
Chloride: 99 mEq/L (ref 96–112)
Potassium: 3.5 mEq/L (ref 3.5–5.3)

## 2011-01-24 LAB — CBC WITH DIFFERENTIAL/PLATELET
Basophils Absolute: 0.1 10*3/uL (ref 0.0–0.1)
Eosinophils Absolute: 0.2 10*3/uL (ref 0.0–0.7)
Eosinophils Relative: 2 % (ref 0–5)
Lymphocytes Relative: 20 % (ref 12–46)
MCH: 31.7 pg (ref 26.0–34.0)
MCV: 95.5 fL (ref 78.0–100.0)
Neutrophils Relative %: 71 % (ref 43–77)
Platelets: 277 10*3/uL (ref 150–400)
RDW: 14.3 % (ref 11.5–15.5)
WBC: 10.9 10*3/uL — ABNORMAL HIGH (ref 4.0–10.5)

## 2011-01-24 MED ORDER — IOHEXOL 300 MG/ML  SOLN
100.0000 mL | Freq: Once | INTRAMUSCULAR | Status: AC | PRN
  Administered 2011-01-24: 100 mL via INTRAVENOUS

## 2011-01-24 NOTE — Progress Notes (Signed)
Quick Note:  Discussed with RMR. Suspect resolving diverticulitis. No ongoing evidence of inflammation or infection on CT. No evidence of acute umbilical hernia issues. Finish antibiotics. If pain does not resolve, let us know.  Several foci in liver, seen previously, likely benign. Just to be safe, given history of breast cancer, recommend MRI liver with Eovist. Discussed with Dr. Jena Gauss.   TCS/EGD/ED in 3 weeks with Dr. Jena Gauss. ______

## 2011-01-24 NOTE — Progress Notes (Signed)
Primary Care Physician:  Kirstie Peri, MD  Primary Gastroenterologist:  Roetta Sessions, MD   Chief Complaint  Patient presents with  . Lower left abdominal pain    HPI:  Karen [name] is a 65 y.o. female here for further evaluation of recurrent diverticulitis. First bout was in 08/2010, documented on CT. Uncomplicated course. She presents with another bout of diverticulitis. Started two weeks ago. Saw Dr. Sherryll Burger and was started on Cipro/Flagyl. Almost completed antibiotics. Still with LLQ pain. No improvement since on antibiotics. Worse at night. BM soft. Sometimes pain followed by brbpr, small amount. Noticed in toilet and on tissue. Sometimes pass just blood. Subjective fever at night. Some nausea but no vomiting. Eating liquids and soft foods. Taking Dexilant only prn for heartburn. Still has dysphagia. At first she took Dexilant regularly but now prn.     Current Outpatient Prescriptions  Medication Sig Dispense Refill  . ciprofloxacin (CIPRO) 500 MG tablet Take 500 mg by mouth 2 (two) times daily.        Marland Kitchen HYDROcodone-acetaminophen (NORCO) 5-325 MG per tablet Take 1 tablet by mouth every 6 (six) hours as needed.        Marland Kitchen lisinopril-hydrochlorothiazide (PRINZIDE,ZESTORETIC) 20-25 MG per tablet Take 1 tablet by mouth daily.        . metroNIDAZOLE (FLAGYL) 500 MG tablet Take 500 mg by mouth 3 (three) times daily.        . ondansetron (ZOFRAN-ODT) 8 MG disintegrating tablet Take 8 mg by mouth every 8 (eight) hours as needed.        . verapamil (CALAN-SR) 180 MG CR tablet Take 180 mg by mouth at bedtime.        Marland Kitchen dexlansoprazole (DEXILANT) 60 MG capsule Take 60 mg by mouth daily.            Allergies as of 01/24/2011 - Review Complete 01/24/2011  Allergen Reaction Noted  . Sulfonamide derivatives  08/30/2010    Past Medical History  Diagnosis Date  . GERD (gastroesophageal reflux disease)   . HTN (hypertension)   . Goiter   . Breast cancer 2004    bilateral mastectomy-all left lymph  nodes removed; chemo. Patient states she had "spot" on the right breast as well.    Past Surgical History  Procedure Date  . Esophagogastroduodenoscopy 2008    lipoma distal esophagus, gastritis   . Colonoscopy 2008    colon with few diverticula in sigmoid colon  . Partial hysterectomy   . Bilateral mastectomy w/reconstruction 2004    Family History  Problem Relation Age of Onset  . Pancreatic cancer Mother     deceased  . Aneurysm Father     deceased, aortic  . Colon cancer Neg Hx   . Breast cancer Sister     deceased 8 years after initial diagnosis  . Breast cancer Sister     survivor  . Diverticulitis Brother     s/p partial colon resection    History   Social History  . Marital Status: Single    Spouse Name: N/A    Number of Children: 3  . Years of Education: N/A   Occupational History  . retired    Social History Main Topics  . Smoking status: Former Games developer  . Smokeless tobacco: Not on file   Comment: Quit 1997  . Alcohol Use: No  . Drug Use: No  . Sexually Active: Not on file      ROS:  General: Negative for anorexia, weight loss, chills, fatigue,  weakness. Eyes: Negative for vision changes.  ENT: Negative for hoarseness, nasal congestion. CV: Negative for chest pain, angina, palpitations, dyspnea on exertion, peripheral edema.  Respiratory: Negative for dyspnea at rest, dyspnea on exertion, cough, sputum, wheezing.  GI: See history of present illness. C/O ongoing dysphagia to solid foods. GU:  Negative for dysuria, hematuria, urinary incontinence, urinary frequency, nocturnal urination.  MS: Negative for joint pain, low back pain.  Derm: Negative for rash or itching.  Neuro: Negative for weakness, abnormal sensation, seizure, frequent headaches, memory loss, confusion.  Psych: Negative for anxiety, depression, suicidal ideation, hallucinations.  Endo: Negative for unusual weight change.  Heme: Negative for bruising or bleeding. Allergy: Negative  for rash or hives.    Physical Examination:  BP 141/90  Pulse 73  Temp(Src) 96.6 F (35.9 C) (Temporal)  Ht 5\' 2"  (1.575 m)  Wt 205 lb 12.8 oz (93.35 kg)  BMI 37.64 kg/m2   General: Well-nourished, well-developed in no acute distress. Appears uncomfortable. Head: Normocephalic, atraumatic.   Eyes: Conjunctiva pink, no icterus. Mouth: Oropharyngeal mucosa moist and pink , no lesions erythema or exudate. Neck: Supple without thyromegaly, masses, or lymphadenopathy.  Lungs: Clear to auscultation bilaterally.  Heart: Regular rate and rhythm, no murmurs rubs or gallops.  Abdomen: Bowel sounds are normal, nondistended, no hepatosplenomegaly or masses, no abdominal bruits, no rebound. Periumbilical hernia tender. Tender in Left abdomen.    Rectal: Defer to time of colonoscopy. Extremities: No lower extremity edema. No clubbing or deformities.  Neuro: Alert and oriented x 4 , grossly normal neurologically.  Skin: Warm and dry, no rash or jaundice.   Psych: Alert and cooperative, normal mood and affect.

## 2011-01-24 NOTE — Assessment & Plan Note (Signed)
Small volume. ?diverticular? Cannot exclude ischemic colitis. CT. Will need colonoscopy in near future.

## 2011-01-24 NOTE — Progress Notes (Signed)
Quick Note:  WBC slightly up. Nonfasting glucose slightly elevated. Await CT. ______

## 2011-01-24 NOTE — Assessment & Plan Note (Signed)
Tender periumbilical hernia, reducible. Evaluate at time of CT.

## 2011-01-24 NOTE — Assessment & Plan Note (Signed)
Persistent dysphagia. EGD/ED in near future. Await management of abdominal pain, then will schedule at time of TCS.

## 2011-01-24 NOTE — Assessment & Plan Note (Signed)
Heartburn better on Dexilant. Taking prn at this point.

## 2011-01-24 NOTE — Assessment & Plan Note (Signed)
LLQ pain with h/o diverticulitis. Suspect recurrent diverticulitis. On Abx for two weeks without improvement. CT A/P to rule out complications.

## 2011-01-24 NOTE — Progress Notes (Signed)
Cc to PCP 

## 2011-01-26 NOTE — Progress Notes (Signed)
Quick Note:  Tried to call pt, phone number was busy ______

## 2011-01-27 NOTE — Progress Notes (Signed)
Addended by: Jennings Books on: 01/27/2011 01:08 PM   Modules accepted: Orders

## 2011-01-27 NOTE — Progress Notes (Signed)
Quick Note:  Pt aware ______ 

## 2011-01-27 NOTE — Progress Notes (Signed)
Pt is scheduled for mri of liver 02/02/2011@9 :00am;Pt is scheduled for procedure 02/16/2011@8 :15am

## 2011-01-27 NOTE — Progress Notes (Signed)
Addended by: Jennings Books on: 01/27/2011 01:30 PM   Modules accepted: Orders

## 2011-01-28 NOTE — Progress Notes (Signed)
Quick Note:  Call with PR on Monday. Keep MRI appt. If develop fever, pain severe, go to ED. Given CT findings would not suggest any more antibiotics at this point. ______

## 2011-02-02 ENCOUNTER — Ambulatory Visit (HOSPITAL_COMMUNITY)
Admission: RE | Admit: 2011-02-02 | Discharge: 2011-02-02 | Disposition: A | Discharge status: Home / Self Care | Payer: Medicare Other | Source: Ambulatory Visit | Attending: Gastroenterology | Admitting: Gastroenterology

## 2011-02-02 DIAGNOSIS — K8020 Calculus of gallbladder without cholecystitis without obstruction: Secondary | ICD-10-CM | POA: Insufficient documentation

## 2011-02-02 DIAGNOSIS — K5732 Diverticulitis of large intestine without perforation or abscess without bleeding: Secondary | ICD-10-CM

## 2011-02-02 DIAGNOSIS — K7689 Other specified diseases of liver: Secondary | ICD-10-CM | POA: Insufficient documentation

## 2011-02-02 DIAGNOSIS — C50919 Malignant neoplasm of unspecified site of unspecified female breast: Secondary | ICD-10-CM | POA: Insufficient documentation

## 2011-02-02 MED ORDER — GADOXETATE DISODIUM 0.25 MMOL/ML IV SOLN
10.0000 mL | Freq: Once | INTRAVENOUS | Status: AC | PRN
  Administered 2011-02-02: 10 mL via INTRAVENOUS

## 2011-02-07 NOTE — Progress Notes (Signed)
Quick Note:  Liver lesions are benign cysts! She does have gallstones and known periumbilical hernia without incarceration.  Await procedures. ______

## 2011-02-15 MED ORDER — SODIUM CHLORIDE 0.45 % IV SOLN
Freq: Once | INTRAVENOUS | Status: AC
  Administered 2011-02-16: 20 mL/h via INTRAVENOUS

## 2011-02-16 ENCOUNTER — Encounter (HOSPITAL_COMMUNITY): Payer: Self-pay | Admitting: *Deleted

## 2011-02-16 ENCOUNTER — Encounter (HOSPITAL_COMMUNITY)
Admission: RE | Disposition: A | Discharge status: Home / Self Care | Payer: Self-pay | Source: Ambulatory Visit | Attending: Internal Medicine

## 2011-02-16 ENCOUNTER — Ambulatory Visit (HOSPITAL_COMMUNITY)
Admission: RE | Admit: 2011-02-16 | Discharge: 2011-02-16 | Disposition: A | Discharge status: Home / Self Care | Payer: Medicare Other | Source: Ambulatory Visit | Attending: Internal Medicine | Admitting: Internal Medicine

## 2011-02-16 ENCOUNTER — Other Ambulatory Visit: Payer: Self-pay | Admitting: Internal Medicine

## 2011-02-16 DIAGNOSIS — K621 Rectal polyp: Secondary | ICD-10-CM

## 2011-02-16 DIAGNOSIS — R1032 Left lower quadrant pain: Secondary | ICD-10-CM

## 2011-02-16 DIAGNOSIS — R110 Nausea: Secondary | ICD-10-CM | POA: Insufficient documentation

## 2011-02-16 DIAGNOSIS — I10 Essential (primary) hypertension: Secondary | ICD-10-CM | POA: Insufficient documentation

## 2011-02-16 DIAGNOSIS — Z79899 Other long term (current) drug therapy: Secondary | ICD-10-CM | POA: Insufficient documentation

## 2011-02-16 DIAGNOSIS — D129 Benign neoplasm of anus and anal canal: Secondary | ICD-10-CM | POA: Insufficient documentation

## 2011-02-16 DIAGNOSIS — D128 Benign neoplasm of rectum: Secondary | ICD-10-CM | POA: Insufficient documentation

## 2011-02-16 DIAGNOSIS — R1310 Dysphagia, unspecified: Secondary | ICD-10-CM

## 2011-02-16 DIAGNOSIS — K449 Diaphragmatic hernia without obstruction or gangrene: Secondary | ICD-10-CM

## 2011-02-16 DIAGNOSIS — K5730 Diverticulosis of large intestine without perforation or abscess without bleeding: Secondary | ICD-10-CM

## 2011-02-16 DIAGNOSIS — K620 Anal polyp: Secondary | ICD-10-CM

## 2011-02-16 DIAGNOSIS — D126 Benign neoplasm of colon, unspecified: Secondary | ICD-10-CM

## 2011-02-16 HISTORY — PX: COLONOSCOPY: SHX5424

## 2011-02-16 HISTORY — PX: SAVORY DILATION: SHX5439

## 2011-02-16 HISTORY — PX: MALONEY DILATION: SHX5535

## 2011-02-16 HISTORY — PX: ESOPHAGOGASTRODUODENOSCOPY: SHX5428

## 2011-02-16 HISTORY — DX: Hypothyroidism, unspecified: E03.9

## 2011-02-16 SURGERY — COLONOSCOPY
Anesthesia: Moderate Sedation

## 2011-02-16 MED ORDER — ONDANSETRON HCL 4 MG/2ML IJ SOLN
4.0000 mg | Freq: Once | INTRAMUSCULAR | Status: AC
  Administered 2011-02-16: 4 mg via INTRAVENOUS

## 2011-02-16 MED ORDER — ONDANSETRON HCL 4 MG/2ML IJ SOLN
INTRAMUSCULAR | Status: AC
  Administered 2011-02-16: 4 mg via INTRAVENOUS
  Filled 2011-02-16: qty 2

## 2011-02-16 MED ORDER — MIDAZOLAM HCL 5 MG/5ML IJ SOLN
INTRAMUSCULAR | Status: AC
  Filled 2011-02-16: qty 10

## 2011-02-16 MED ORDER — BUTAMBEN-TETRACAINE-BENZOCAINE 2-2-14 % EX AERO
INHALATION_SPRAY | CUTANEOUS | Status: DC | PRN
Start: 2011-02-16 — End: 2011-02-16
  Administered 2011-02-16: 1 via TOPICAL

## 2011-02-16 MED ORDER — MEPERIDINE HCL 100 MG/ML IJ SOLN
INTRAMUSCULAR | Status: AC
  Filled 2011-02-16: qty 2

## 2011-02-16 MED ORDER — MEPERIDINE HCL 100 MG/ML IJ SOLN
INTRAMUSCULAR | Status: DC | PRN
  Administered 2011-02-16: 50 mg via INTRAVENOUS
  Administered 2011-02-16 (×2): 25 mg via INTRAVENOUS

## 2011-02-16 MED ORDER — MIDAZOLAM HCL 5 MG/5ML IJ SOLN
INTRAMUSCULAR | Status: DC | PRN
  Administered 2011-02-16: 1 mg via INTRAVENOUS
  Administered 2011-02-16: 2 mg via INTRAVENOUS
  Administered 2011-02-16 (×2): 1 mg via INTRAVENOUS

## 2011-02-16 NOTE — H&P (Addendum)
LLQ pain   - Primary    789.04    Umbilical hernia     553.1    Rectal bleed     569.3    GERD (gastroesophageal reflux disease)     530.81    Dysphagia     787.20    Diverticulitis of colon (without mention of hemorrhage)     562.11    Esophageal reflux     530.81      Reason for Visit     Lower left abdominal pain        Current Vitals       Recorded User        01/24/2011  8:20 AM  Trudee Kuster, LPN           BP Pulse Temp (Src) Resp Ht Wt    141/90  73  96.6 F (35.9 C) (Temporal)  N/A  5\' 2"  (1.575 m)  205 lb 12.8 oz (93.35 kg)       BMI SpO2 PF LMP    37.64 kg/m2  N/A  N/A  N/A          Progress Notes     Tana Coast, PA  01/24/2011  1:46 PM  Signed Primary Care Physician:  Kirstie Peri, MD   Primary Gastroenterologist:  Roetta Sessions, MD      Chief Complaint   Patient presents with   .  Lower left abdominal pain      HPI:  Karen [name] is a 65 y.o. female here for further evaluation of recurrent diverticulitis. First bout was in 08/2010, documented on CT. Uncomplicated course. She presents with another bout of diverticulitis. Started two weeks ago. Saw Dr. Sherryll Burger and was started on Cipro/Flagyl. Almost completed antibiotics. Still with LLQ pain. No improvement since on antibiotics. Worse at night. BM soft. Sometimes pain followed by brbpr, small amount. Noticed in toilet and on tissue. Sometimes pass just blood. Subjective fever at night. Some nausea but no vomiting. Eating liquids and soft foods. Taking Dexilant only prn for heartburn. Still has dysphagia. At first she took Dexilant regularly but now prn.        Current Outpatient Prescriptions   Medication  Sig  Dispense  Refill   .  ciprofloxacin (CIPRO) 500 MG tablet  Take 500 mg by mouth 2 (two) times daily.           Marland Kitchen  HYDROcodone-acetaminophen (NORCO) 5-325 MG per tablet  Take 1 tablet by mouth every 6 (six) hours as needed.           Marland Kitchen  lisinopril-hydrochlorothiazide  (PRINZIDE,ZESTORETIC) 20-25 MG per tablet  Take 1 tablet by mouth daily.           .  metroNIDAZOLE (FLAGYL) 500 MG tablet  Take 500 mg by mouth 3 (three) times daily.           .  ondansetron (ZOFRAN-ODT) 8 MG disintegrating tablet  Take 8 mg by mouth every 8 (eight) hours as needed.           .  verapamil (CALAN-SR) 180 MG CR tablet  Take 180 mg by mouth at bedtime.           Marland Kitchen  dexlansoprazole (DEXILANT) 60 MG capsule  Take 60 mg by mouth daily.                    Allergies as of 01/24/2011 - Review Complete 01/24/2011   Allergen  Reaction  Noted   .  Sulfonamide derivatives    08/30/2010       Past Medical History   Diagnosis  Date   .  GERD (gastroesophageal reflux disease)     .  HTN (hypertension)     .  Goiter     .  Breast cancer  2004       bilateral mastectomy-all left lymph nodes removed; chemo. Patient states she had "spot" on the right breast as well.       Past Surgical History   Procedure  Date   .  Esophagogastroduodenoscopy  2008       lipoma distal esophagus, gastritis    .  Colonoscopy  2008       colon with few diverticula in sigmoid colon   .  Partial hysterectomy     .  Bilateral mastectomy w/reconstruction  2004       Family History   Problem  Relation  Age of Onset   .  Pancreatic cancer  Mother         deceased   .  Aneurysm  Father         deceased, aortic   .  Colon cancer  Neg Hx     .  Breast cancer  Sister         deceased 8 years after initial diagnosis   .  Breast cancer  Sister         survivor   .  Diverticulitis  Brother         s/p partial colon resection       History       Social History   .  Marital Status:  Single       Spouse Name:  N/A       Number of Children:  3   .  Years of Education:  N/A       Occupational History   .  retired         Social History Main Topics   .  Smoking status:  Former Games developer   .  Smokeless tobacco:  Not on file     Comment: Quit 1997   .  Alcohol Use:  No   .  Drug Use:  No   .   Sexually Active:  Not on file         ROS:   General: Negative for anorexia, weight loss, chills, fatigue, weakness. Eyes: Negative for vision changes.   ENT: Negative for hoarseness, nasal congestion. CV: Negative for chest pain, angina, palpitations, dyspnea on exertion, peripheral edema.   Respiratory: Negative for dyspnea at rest, dyspnea on exertion, cough, sputum, wheezing.   GI: See history of present illness. C/O ongoing dysphagia to solid foods. GU:  Negative for dysuria, hematuria, urinary incontinence, urinary frequency, nocturnal urination.   MS: Negative for joint pain, low back pain.   Derm: Negative for rash or itching.   Neuro: Negative for weakness, abnormal sensation, seizure, frequent headaches, memory loss, confusion.   Psych: Negative for anxiety, depression, suicidal ideation, hallucinations.   Endo: Negative for unusual weight change.   Heme: Negative for bruising or bleeding. Allergy: Negative for rash or hives.     Physical Examination:   BP 141/90  Pulse 73  Temp(Src) 96.6 F (35.9 C) (Temporal)  Ht 5\' 2"  (1.575 m)  Wt 205 lb 12.8 oz (93.35 kg)  BMI 37.64 kg/m2    General: Well-nourished, well-developed in no acute distress. Appears  uncomfortable. Head: Normocephalic, atraumatic.    Eyes: Conjunctiva pink, no icterus. Mouth: Oropharyngeal mucosa moist and pink , no lesions erythema or exudate. Neck: Supple without thyromegaly, masses, or lymphadenopathy.   Lungs: Clear to auscultation bilaterally.   Heart: Regular rate and rhythm, no murmurs rubs or gallops.   Abdomen: Bowel sounds are normal, nondistended, no hepatosplenomegaly or masses, no abdominal bruits, no rebound. Periumbilical hernia tender. Tender in Left abdomen.     Rectal: Defer to time of colonoscopy. Extremities: No lower extremity edema. No clubbing or deformities.   Neuro: Alert and oriented x 4 , grossly normal neurologically.   Skin: Warm and dry, no rash or jaundice.      Psych: Alert and cooperative, normal mood and affect.         Tana Coast, PA  01/24/2011  1:47 PM  Signed Quick Note:   WBC slightly up. Nonfasting glucose slightly elevated. Await CT. ______  Tana Coast, PA  01/24/2011  2:31 PM  Signed Quick Note:   Discussed with RMR. Suspect resolving diverticulitis. No ongoing evidence of inflammation or infection on CT. No evidence of acute umbilical hernia issues. Finish antibiotics. If pain does not resolve, let us know.   Several foci in liver, seen previously, likely benign. Just to be safe, given history of breast cancer, recommend MRI liver with Eovist. Discussed with Dr. Jena Gauss.     TCS/EGD/ED in 3 weeks with Dr. Jena Gauss. ______  Glendora Score  01/24/2011  4:26 PM  Signed Cc to PCP  Evalee Mutton, LPN  1/61/0960  3:57 PM  Signed Quick Note:   Tried to call pt, phone number was busy ______  Evalee Mutton, LPN  4/54/0981 19:14 AM  Signed Quick Note:     I have seen the patient prior to the procedure(s) today and reviewed the history and physical / consultation from 01/24/11.  There have been no changes. After consideration of the risks, benefits, alternatives and imponderables, the patient has consented to the procedure(s).  In addition, MRI demonstrates small simple cysts in the liver. Nothing concerning. Cholelithiasis. Small umbilical hernia containing fat only

## 2011-02-22 ENCOUNTER — Encounter: Payer: Self-pay | Admitting: Internal Medicine

## 2011-02-23 ENCOUNTER — Encounter (HOSPITAL_COMMUNITY): Payer: Self-pay | Admitting: Internal Medicine

## 2011-05-18 ENCOUNTER — Telehealth: Payer: Self-pay | Admitting: Gastroenterology

## 2011-05-18 ENCOUNTER — Ambulatory Visit: Payer: Medicare Other | Admitting: Gastroenterology

## 2011-05-18 NOTE — Telephone Encounter (Signed)
One no-show. Please offer f/u.

## 2011-05-18 NOTE — Telephone Encounter (Signed)
Pt was a no show

## 2011-05-24 ENCOUNTER — Encounter: Payer: Self-pay | Admitting: Internal Medicine

## 2011-05-24 NOTE — Telephone Encounter (Signed)
Mailed letter for patient to call office to [facility] °

## 2012-01-19 DIAGNOSIS — R079 Chest pain, unspecified: Secondary | ICD-10-CM

## 2013-03-19 ENCOUNTER — Encounter: Payer: Self-pay | Admitting: Internal Medicine

## 2013-03-19 ENCOUNTER — Ambulatory Visit (INDEPENDENT_AMBULATORY_CARE_PROVIDER_SITE_OTHER): Payer: Medicare Other | Admitting: Internal Medicine

## 2013-03-19 VITALS — BP 154/90 | HR 76 | Temp 98.2°F | Ht 63.0 in | Wt 200.2 lb

## 2013-03-19 DIAGNOSIS — R1032 Left lower quadrant pain: Secondary | ICD-10-CM

## 2013-03-19 DIAGNOSIS — K921 Melena: Secondary | ICD-10-CM

## 2013-03-19 DIAGNOSIS — R197 Diarrhea, unspecified: Secondary | ICD-10-CM

## 2013-03-19 NOTE — Patient Instructions (Addendum)
Stop Metamucil for now  May use Imodium as needed for diarrhea  CBC and BMET now  Stool for C. difficile toxin assay  Further recommendations to follow.

## 2013-03-19 NOTE — Progress Notes (Signed)
Primary Care Physician:  Toma Deiters, MD Primary Gastroenterologist:  Dr. Jena Gauss  Pre-Procedure History & Physical: HPI:  Karen [name] is a 67 y.o. female here for 3-4 month history of diarrhea with intermittent episodes of hematochezia. Has had vague left lower quadrant abdominal pain off and on as well. 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 Diarrhea much worse over the past 3 months. Took antibiotics in the spring for sinus infection. Saw Dr. Roseanne Reno  - she was recently given Augmentin/metronidazole for presumed diverticulitis. Not much improvement. No nausea or vomiting. No fever or chills. Minimal diverticulosis and 2 small adenomas on 2012 colonoscopy. Suboptimal preparation. No cross-sectional imaging in over 2 years.  Brother with Crohn's disease.  Past Medical History  Diagnosis Date  . GERD (gastroesophageal reflux disease)   . HTN (hypertension)   . Goiter   . Breast cancer 2004    bilateral mastectomy-all left lymph nodes removed; chemo. Patient states she had "spot" on the right breast as well.  . Hypothyroidism   . Tubular adenoma   . Diverticulitis     Past Surgical History  Procedure Laterality Date  . Esophagogastroduodenoscopy  2008    lipoma distal esophagus, gastritis   . Colonoscopy  2008    colon with few diverticula in sigmoid colon  . Partial hysterectomy    . Bilateral mastectomy w/reconstruction  2004  . Colonoscopy  02/16/2011    Dr. Jena Gauss- colon and rectal polyps= tubular adenoma, L sided diverticulosis.  . Esophagogastroduodenoscopy  02/16/2011    Dr. Jena Gauss- normal esophagus, small hiatal hernia o/w normal stomach. normal first and second portion of the duodenum  . Maloney dilation  02/16/2011    Procedure: Elease Hashimoto DILATION;  Surgeon: Corbin Ade, MD;  Location: [facility];  Service: Endoscopy;  Laterality: N/A;  . Savory dilation  02/16/2011    Procedure: SAVORY DILATION;  Surgeon: Corbin Ade, MD;  Location: [facility];   Service: Endoscopy;  Laterality: N/A;    Prior to Admission medications   Medication Sig Start Date End Date Taking? Authorizing Provider  ALPRAZolam Prudy Feeler) 1 MG tablet Take 1 mg by mouth at bedtime as needed.  02/12/13  Yes Historical Provider, MD  benazepril (LOTENSIN) 40 MG tablet  02/01/13  Yes Historical Provider, MD  diltiazem (CARDIZEM CD) 180 MG 24 hr capsule Take 180 mg by mouth daily.  01/23/13  Yes Historical Provider, MD  furosemide (LASIX) 20 MG tablet Take 20 mg by mouth daily.  02/01/13  Yes Historical Provider, MD  levothyroxine (SYNTHROID, LEVOTHROID) 50 MCG tablet Take 100 mcg by mouth daily.    Yes Historical Provider, MD  amoxicillin-clavulanate (AUGMENTIN) 875-125 MG per tablet  02/12/13   Historical Provider, MD  ciprofloxacin (CIPRO) 500 MG tablet Take 500 mg by mouth 2 (two) times daily.      Historical Provider, MD  dexlansoprazole (DEXILANT) 60 MG capsule Take 60 mg by mouth daily.     Historical Provider, MD  HYDROcodone-acetaminophen (NORCO) 5-325 MG per tablet Take 1 tablet by mouth every 6 (six) hours as needed.      Historical Provider, MD  lisinopril-hydrochlorothiazide (PRINZIDE,ZESTORETIC) 20-25 MG per tablet Take 1 tablet by mouth daily.      Historical Provider, MD  metroNIDAZOLE (FLAGYL) 500 MG tablet Take 500 mg by mouth 3 (three) times daily.     Historical Provider, MD  ondansetron (ZOFRAN-ODT) 8 MG disintegrating tablet Take 8 mg by mouth every 8 (eight) hours as needed.  Historical Provider, MD  verapamil (CALAN-SR) 180 MG CR tablet Take 180 mg by mouth at bedtime.      Historical Provider, MD    Allergies as of 03/19/2013 - Review Complete 03/19/2013  Allergen Reaction Noted  . Sulfonamide derivatives  08/30/2010    Family History  Problem Relation Age of Onset  . Pancreatic cancer Mother     deceased  . Aneurysm Father     deceased, aortic  . Colon cancer Neg Hx   . Breast cancer Sister     deceased 8 years after initial diagnosis  . Breast  cancer Sister     survivor  . Diverticulitis Brother     s/p partial colon resection    History   Social History  . Marital Status: Single    Spouse Name: N/A    Number of Children: 3  . Years of Education: N/A   Occupational History  . retired    Social History Main Topics  . Smoking status: Former Games developer  . Smokeless tobacco: Not on file     Comment: Quit 1997  . Alcohol Use: No  . Drug Use: No  . Sexual Activity: Not on file   Other Topics Concern  . Not on file   Social History Narrative  . No narrative on file    Review of Systems: See HPI, otherwise negative ROS  Physical Exam: BP 154/90  Pulse 76  Temp(Src) 98.2 F (36.8 C) (Oral)  Ht 5\' 3"  (1.6 m)  Wt 200 lb 3.2 oz (90.81 kg)  BMI 35.47 kg/m2 General:   Appears anxious and not feel well but does not appear acutely ill or toxic at this time.  Well-developed, well-nourished, pleasant and cooperative in NAD Skin:  Intact without significant lesions or rashes. Eyes:  Sclera clear, no icterus.   Conjunctiva pink. Ears:  Normal auditory acuity. Nose:  No deformity, discharge,  or lesions. Mouth:  No deformity or lesions. Neck:  Supple; no masses or thyromegaly. No significant cervical adenopathy. Lungs:  Clear throughout to auscultation.   No wheezes, crackles, or rhonchi. No acute distress. Heart:  Regular rate and rhythm; no murmurs, clicks, rubs,  or gallops. Abdomen: Obese. Positive bowel sounds soft with minimal left lower quadrant tenderness to palpation. No mass or rebound tenderness. No hepatosplenomegaly. Pulses:  Normal pulses noted. Extremities:  Without clubbing or edema. Rectal:   Couple small external hemorrhoidal tags. Rectal vault empty. No mass or rectal vault. No stool in rectal vault. Mucus Hemoccult-negative.   Impression:  Acute on chronic diarrhea with vague left-sided abdominal pain her 3 months duration. Previous exposure to antibiotics for sinus infection earlier this year. Recent  course of Augmentin and metronidazole without much improvement. C. difficile infection remains fairly high on the differential list. Presentation atypical for diverticulitis. Does not appear critically ill or toxic.  Recommendations:   Stop Metamucil for now. May use Imodium when necessary diarrhea. Send stool for Clostridium difficile toxin assay. If toxin assay negative we'll proceed with cross sectional imaging via contrast CT scan. She may end up getting a colonoscopy to further evaluate for underlying colitis, etc.  Hematochezia-likely secondary to hemorrhoids.  She will likely benefit from hemorrhoidal banding once over her acute illness.  C. difficile toxin assay. CBC and BMET today. Further recommendations to follow.

## 2013-03-21 LAB — CBC WITH DIFFERENTIAL/PLATELET
Hemoglobin: 11.8 g/dL — ABNORMAL LOW (ref 12.0–15.0)
Lymphocytes Relative: 26 % (ref 12–46)
Lymphs Abs: 2.7 10*3/uL (ref 0.7–4.0)
Monocytes Relative: 6 % (ref 3–12)
Neutro Abs: 6.6 10*3/uL (ref 1.7–7.7)
Neutrophils Relative %: 66 % (ref 43–77)
RBC: 3.83 MIL/uL — ABNORMAL LOW (ref 3.87–5.11)

## 2013-03-21 LAB — CLOSTRIDIUM DIFFICILE BY PCR: Toxigenic C. Difficile by PCR: NOT DETECTED

## 2013-03-21 LAB — COMPREHENSIVE METABOLIC PANEL
Albumin: 3.9 g/dL (ref 3.5–5.2)
Alkaline Phosphatase: 112 U/L (ref 39–117)
CO2: 34 mEq/L — ABNORMAL HIGH (ref 19–32)
Chloride: 102 mEq/L (ref 96–112)
Glucose, Bld: 94 mg/dL (ref 70–99)
Potassium: 3.6 mEq/L (ref 3.5–5.3)
Sodium: 144 mEq/L (ref 135–145)
Total Protein: 7.2 g/dL (ref 6.0–8.3)

## 2013-04-16 ENCOUNTER — Telehealth: Payer: Self-pay

## 2013-04-16 NOTE — Telephone Encounter (Signed)
Pt is calling about the labs and stools sample she did on 03/19/13. Please advise

## 2013-04-17 ENCOUNTER — Encounter: Payer: Self-pay | Admitting: Internal Medicine

## 2013-04-17 NOTE — Progress Notes (Signed)
Patient ID: Karen [name], female   DOB: 29-Aug-1945, 67 y.o.   MRN: [id number]   Reviewed labs. She has a mild anemia. C. difficile negative. Per office plan,  proceed with a contrast CT of abdomen and pelvis to further evaluate her left lower quadrant abdominal pain (if she still having it). She may still need a colonoscopy to further evaluate diarrhea.

## 2013-04-23 ENCOUNTER — Other Ambulatory Visit: Payer: Self-pay | Admitting: Internal Medicine

## 2013-04-23 DIAGNOSIS — R1032 Left lower quadrant pain: Secondary | ICD-10-CM

## 2013-04-23 NOTE — Progress Notes (Signed)
Please make patient aware that her CT is scheduled for Monday Nov 17th arrive at [facility] at 1:30 and that she will need to go by Radiology ahead of time an pick oral contrast to drink

## 2013-04-23 NOTE — Progress Notes (Signed)
Pt is aware. She is currently having problems with a possible broken hip ( she is waiting for results from pcp) she still has problems at times and would like to have CT scheduled. Benedetto Goad, please schedule.

## 2013-04-23 NOTE — Progress Notes (Signed)
PATIENT IS AWARE 

## 2013-04-29 ENCOUNTER — Ambulatory Visit (HOSPITAL_COMMUNITY): Payer: Medicare Other

## 2014-07-11 ENCOUNTER — Other Ambulatory Visit (HOSPITAL_COMMUNITY): Payer: Self-pay | Admitting: Internal Medicine

## 2014-07-11 DIAGNOSIS — M8580 Other specified disorders of bone density and structure, unspecified site: Secondary | ICD-10-CM

## 2014-07-30 ENCOUNTER — Other Ambulatory Visit (HOSPITAL_COMMUNITY): Payer: Self-pay

## 2014-08-13 DIAGNOSIS — M545 Low back pain: Secondary | ICD-10-CM | POA: Diagnosis not present

## 2014-08-13 DIAGNOSIS — Z6833 Body mass index (BMI) 33.0-33.9, adult: Secondary | ICD-10-CM | POA: Diagnosis not present

## 2014-08-13 DIAGNOSIS — B373 Candidiasis of vulva and vagina: Secondary | ICD-10-CM | POA: Diagnosis not present

## 2014-08-19 DIAGNOSIS — M4806 Spinal stenosis, lumbar region: Secondary | ICD-10-CM | POA: Diagnosis not present

## 2014-08-22 DIAGNOSIS — M5441 Lumbago with sciatica, right side: Secondary | ICD-10-CM | POA: Diagnosis not present

## 2014-08-22 DIAGNOSIS — M4806 Spinal stenosis, lumbar region: Secondary | ICD-10-CM | POA: Diagnosis not present

## 2014-09-22 DIAGNOSIS — M4806 Spinal stenosis, lumbar region: Secondary | ICD-10-CM | POA: Diagnosis not present

## 2014-09-23 NOTE — Pre-Procedure Instructions (Signed)
Karen [name]  09/23/2014   Your procedure is scheduled on:  April 14th, Thursday   Report to [facility] Admitting at 9:00 AM.  Call this number if you have problems the morning of surgery: [phone]   Remember:   Do not eat food or drink liquids after midnight Wednesday.   Take these medicines the morning of surgery with A SIP OF WATER: Xanax, Norvasc, Hydrocodone, Levothyroxine.   Do not wear jewelry, make-up or nail polish.  Do not wear lotions, powders, or perfumes. You may NOT wear deodorant the day of surgery.  Do not shave underarms & legs 48 hours prior to surgery.    Do not bring valuables to the hospital.  [facility] is not responsible for any belongings or valuables.               Contacts, dentures or bridgework may not be worn into surgery.  Leave suitcase in the car. After surgery it may be brought to your room.  For patients admitted to the hospital, discharge time is determined by your treatment team.               Name and phone number of your driver:    Special Instructions: "Preparing for Surgery" instruction sheet.   Please read over the following fact sheets that you were given: Pain Booklet, Coughing and Deep Breathing, MRSA Information and Surgical Site Infection Prevention

## 2014-09-24 ENCOUNTER — Encounter (HOSPITAL_COMMUNITY)
Admission: RE | Admit: 2014-09-24 | Discharge: 2014-09-24 | Disposition: A | Discharge status: Home / Self Care | Payer: Medicare Other | Source: Ambulatory Visit | Attending: Orthopedic Surgery | Admitting: Orthopedic Surgery

## 2014-09-24 ENCOUNTER — Encounter (HOSPITAL_COMMUNITY): Payer: Self-pay

## 2014-09-24 DIAGNOSIS — Z87891 Personal history of nicotine dependence: Secondary | ICD-10-CM | POA: Diagnosis not present

## 2014-09-24 DIAGNOSIS — M4806 Spinal stenosis, lumbar region: Secondary | ICD-10-CM | POA: Diagnosis not present

## 2014-09-24 HISTORY — DX: Sleep apnea, unspecified: G47.30

## 2014-09-24 LAB — CBC
HCT: 39.1 % (ref 36.0–46.0)
Hemoglobin: 12.9 g/dL (ref 12.0–15.0)
MCH: 31.1 pg (ref 26.0–34.0)
MCHC: 33 g/dL (ref 30.0–36.0)
MCV: 94.2 fL (ref 78.0–100.0)
Platelets: 268 10*3/uL (ref 150–400)
RBC: 4.15 MIL/uL (ref 3.87–5.11)
RDW: 13.7 % (ref 11.5–15.5)
WBC: 10.2 10*3/uL (ref 4.0–10.5)

## 2014-09-24 LAB — BASIC METABOLIC PANEL
ANION GAP: 9 (ref 5–15)
BUN: 14 mg/dL (ref 6–23)
CALCIUM: 9.6 mg/dL (ref 8.4–10.5)
CO2: 32 mmol/L (ref 19–32)
Chloride: 100 mmol/L (ref 96–112)
Creatinine, Ser: 0.8 mg/dL (ref 0.50–1.10)
GFR, EST AFRICAN AMERICAN: 86 mL/min — AB (ref >90)
GFR, EST NON AFRICAN AMERICAN: 74 mL/min — AB (ref >90)
Glucose, Bld: 106 mg/dL — ABNORMAL HIGH (ref 70–99)
Potassium: 3.1 mmol/L — ABNORMAL LOW (ref 3.5–5.1)
Sodium: 141 mmol/L (ref 135–145)

## 2014-09-24 LAB — SURGICAL PCR SCREEN
MRSA, PCR: NEGATIVE
STAPHYLOCOCCUS AUREUS: NEGATIVE

## 2014-09-24 LAB — NO BLOOD PRODUCTS

## 2014-09-24 MED ORDER — CEFAZOLIN SODIUM-DEXTROSE 2-3 GM-% IV SOLR
2.0000 g | INTRAVENOUS | Status: AC
  Administered 2014-09-25: 2 g via INTRAVENOUS
  Filled 2014-09-24: qty 50

## 2014-09-24 NOTE — Progress Notes (Addendum)
Denies any cardiac issues.   Has never seen a cardio, no tests done.  Pt is Jehovah's Witness. I had to leave voice mail for Dr. Rolena Infante, regarding pt's potassium level of 3.1  DA

## 2014-09-24 NOTE — Progress Notes (Signed)
Anesthesia Chart Review:  Patient is a 69 year old female scheduled for L3-5 decompression and insertion of Coflex tomorrow by Dr. Rolena Infante. PAT was this morning.  History includes former smoker, HTN, goiter, hypothyroidism, suspected OSA '04, breast cancer s/p bilateral mastectomies with left axillary node dissection '04, GERD, hysterectomy. BMI is consistent with obesity. Jehovah's Witness.  Meds include Xanax, amlodipine, Norco, levothyroxine, Zestoretic.  09/24/14 EKG: NSR. Cannot rule out inferior infarct (age undetermined).  She had inferior Q waves in leads III and aVF dating back to her EKG in [place] from 10/03/02.  Remote echo in 2004.   Preoperative labs noted. NO TRANSFUSE BLOOD PRODUCTS.   I think inferior changes on her EKG are stable X >10 years. She denied any cardiac issues at PAT.  If she remains asymptomatic from a CV standpoint then I would anticipate that she could proceed as planned.  Karen [name]/Anesthesiology Phone [phone] 09/24/2014 1:42 PM

## 2014-09-24 NOTE — Progress Notes (Signed)
   09/24/14 0906  OBSTRUCTIVE SLEEP APNEA  Have you ever been diagnosed with sleep apnea through a sleep study? No (she was to be tested, but breast cancer was dx...this has been placed on the 'back burner")  Do you snore loudly (loud enough to be heard through closed doors)?  1 (per her husband)  Do you often feel tired, fatigued, or sleepy during the daytime? 1  Has anyone observed you stop breathing during your sleep? 1 (her Mother stated she had stopped breathing)  Do you have, or are you being treated for high blood pressure? 1  BMI more than 35 kg/m2? 0  Age over 6 years old? 1  Neck circumference greater than 40 cm/16 inches? 0  Gender: 0

## 2014-09-25 ENCOUNTER — Inpatient Hospital Stay (HOSPITAL_COMMUNITY): Payer: Medicare Other | Admitting: Vascular Surgery

## 2014-09-25 ENCOUNTER — Inpatient Hospital Stay (HOSPITAL_COMMUNITY): Payer: Medicare Other | Admitting: Anesthesiology

## 2014-09-25 ENCOUNTER — Inpatient Hospital Stay (HOSPITAL_COMMUNITY): Payer: Medicare Other

## 2014-09-25 ENCOUNTER — Encounter (HOSPITAL_COMMUNITY)
Admission: RE | Disposition: A | Discharge status: Home / Self Care | Payer: Self-pay | Source: Ambulatory Visit | Attending: Orthopedic Surgery

## 2014-09-25 ENCOUNTER — Encounter (HOSPITAL_COMMUNITY): Payer: Self-pay | Admitting: Anesthesiology

## 2014-09-25 ENCOUNTER — Inpatient Hospital Stay (HOSPITAL_COMMUNITY)
Admission: RE | Admit: 2014-09-25 | Discharge: 2014-09-28 | DRG: 520 | Disposition: A | Discharge status: Home / Self Care | Payer: Medicare Other | Source: Ambulatory Visit | Attending: Orthopedic Surgery | Admitting: Orthopedic Surgery

## 2014-09-25 DIAGNOSIS — R0902 Hypoxemia: Secondary | ICD-10-CM | POA: Diagnosis not present

## 2014-09-25 DIAGNOSIS — Z87891 Personal history of nicotine dependence: Secondary | ICD-10-CM | POA: Diagnosis not present

## 2014-09-25 DIAGNOSIS — M5137 Other intervertebral disc degeneration, lumbosacral region: Secondary | ICD-10-CM | POA: Diagnosis not present

## 2014-09-25 DIAGNOSIS — M4806 Spinal stenosis, lumbar region: Principal | ICD-10-CM | POA: Diagnosis present

## 2014-09-25 DIAGNOSIS — M47817 Spondylosis without myelopathy or radiculopathy, lumbosacral region: Secondary | ICD-10-CM | POA: Diagnosis not present

## 2014-09-25 DIAGNOSIS — Z853 Personal history of malignant neoplasm of breast: Secondary | ICD-10-CM | POA: Diagnosis not present

## 2014-09-25 DIAGNOSIS — M4317 Spondylolisthesis, lumbosacral region: Secondary | ICD-10-CM | POA: Diagnosis not present

## 2014-09-25 DIAGNOSIS — M4800 Spinal stenosis, site unspecified: Secondary | ICD-10-CM | POA: Diagnosis present

## 2014-09-25 DIAGNOSIS — Z01818 Encounter for other preprocedural examination: Secondary | ICD-10-CM | POA: Diagnosis not present

## 2014-09-25 DIAGNOSIS — Z419 Encounter for procedure for purposes other than remedying health state, unspecified: Secondary | ICD-10-CM

## 2014-09-25 DIAGNOSIS — J984 Other disorders of lung: Secondary | ICD-10-CM | POA: Diagnosis not present

## 2014-09-25 DIAGNOSIS — K219 Gastro-esophageal reflux disease without esophagitis: Secondary | ICD-10-CM | POA: Diagnosis not present

## 2014-09-25 HISTORY — PX: LUMBAR LAMINECTOMY/DECOMPRESSION MICRODISCECTOMY: SHX5026

## 2014-09-25 SURGERY — LUMBAR LAMINECTOMY/DECOMPRESSION MICRODISCECTOMY 2 LEVELS
Anesthesia: General | Site: Back

## 2014-09-25 MED ORDER — LIDOCAINE HCL (CARDIAC) 20 MG/ML IV SOLN
INTRAVENOUS | Status: AC
  Filled 2014-09-25: qty 5

## 2014-09-25 MED ORDER — ACETAMINOPHEN 10 MG/ML IV SOLN
1000.0000 mg | Freq: Four times a day (QID) | INTRAVENOUS | Status: AC
  Administered 2014-09-25 – 2014-09-26 (×2): 1000 mg via INTRAVENOUS
  Filled 2014-09-25 (×3): qty 100

## 2014-09-25 MED ORDER — BUPIVACAINE-EPINEPHRINE (PF) 0.25% -1:200000 IJ SOLN
INTRAMUSCULAR | Status: DC | PRN
  Administered 2014-09-25: 10 mL

## 2014-09-25 MED ORDER — DEXAMETHASONE SODIUM PHOSPHATE 10 MG/ML IJ SOLN
INTRAMUSCULAR | Status: DC | PRN
  Administered 2014-09-25: 10 mg via INTRAVENOUS

## 2014-09-25 MED ORDER — DEXAMETHASONE SODIUM PHOSPHATE 10 MG/ML IJ SOLN
INTRAMUSCULAR | Status: AC
  Filled 2014-09-25: qty 1

## 2014-09-25 MED ORDER — SODIUM CHLORIDE 0.9 % IJ SOLN
3.0000 mL | Freq: Two times a day (BID) | INTRAMUSCULAR | Status: DC
  Administered 2014-09-26 – 2014-09-27 (×3): 3 mL via INTRAVENOUS

## 2014-09-25 MED ORDER — FENTANYL CITRATE 0.05 MG/ML IJ SOLN
INTRAMUSCULAR | Status: DC | PRN
  Administered 2014-09-25: 50 ug via INTRAVENOUS
  Administered 2014-09-25: 100 ug via INTRAVENOUS
  Administered 2014-09-25 (×2): 50 ug via INTRAVENOUS

## 2014-09-25 MED ORDER — ROCURONIUM BROMIDE 100 MG/10ML IV SOLN
INTRAVENOUS | Status: DC | PRN
  Administered 2014-09-25: 40 mg via INTRAVENOUS

## 2014-09-25 MED ORDER — THROMBIN 20000 UNITS EX SOLR
CUTANEOUS | Status: DC | PRN
  Administered 2014-09-25: 20000 mL via TOPICAL

## 2014-09-25 MED ORDER — LACTATED RINGERS IV SOLN
INTRAVENOUS | Status: DC
  Administered 2014-09-25 (×3): via INTRAVENOUS

## 2014-09-25 MED ORDER — BUPIVACAINE-EPINEPHRINE (PF) 0.25% -1:200000 IJ SOLN
INTRAMUSCULAR | Status: AC
  Filled 2014-09-25: qty 30

## 2014-09-25 MED ORDER — PHENOL 1.4 % MT LIQD: 1.0000 | OROMUCOSAL | Status: DC | PRN

## 2014-09-25 MED ORDER — ONDANSETRON HCL 4 MG/2ML IJ SOLN
INTRAMUSCULAR | Status: DC | PRN
Start: 2014-09-25 — End: 2014-09-25
  Administered 2014-09-25: 4 mg via INTRAVENOUS

## 2014-09-25 MED ORDER — THROMBIN 20000 UNITS EX SOLR
CUTANEOUS | Status: AC
  Filled 2014-09-25: qty 20000

## 2014-09-25 MED ORDER — ONDANSETRON HCL 4 MG/2ML IJ SOLN
4.0000 mg | INTRAMUSCULAR | Status: DC | PRN
  Administered 2014-09-27: 4 mg via INTRAVENOUS
  Filled 2014-09-25: qty 2

## 2014-09-25 MED ORDER — METHOCARBAMOL 500 MG PO TABS
500.0000 mg | ORAL_TABLET | Freq: Four times a day (QID) | ORAL | Status: DC | PRN
  Administered 2014-09-26 – 2014-09-27 (×3): 500 mg via ORAL
  Filled 2014-09-25 (×4): qty 1

## 2014-09-25 MED ORDER — OXYCODONE HCL 5 MG/5ML PO SOLN: 5.0000 mg | Freq: Once | ORAL | Status: DC | PRN

## 2014-09-25 MED ORDER — FENTANYL CITRATE 0.05 MG/ML IJ SOLN
INTRAMUSCULAR | Status: AC
  Filled 2014-09-25: qty 5

## 2014-09-25 MED ORDER — LISINOPRIL-HYDROCHLOROTHIAZIDE 20-12.5 MG PO TABS: 1.0000 | ORAL_TABLET | Freq: Two times a day (BID) | ORAL | Status: DC

## 2014-09-25 MED ORDER — 0.9 % SODIUM CHLORIDE (POUR BTL) OPTIME
TOPICAL | Status: DC | PRN
  Administered 2014-09-25: 1000 mL

## 2014-09-25 MED ORDER — ONDANSETRON HCL 4 MG/2ML IJ SOLN: 4.0000 mg | Freq: Once | INTRAMUSCULAR | Status: DC | PRN

## 2014-09-25 MED ORDER — LACTATED RINGERS IV SOLN
INTRAVENOUS | Status: DC
  Administered 2014-09-26: 02:00:00 via INTRAVENOUS

## 2014-09-25 MED ORDER — LIDOCAINE HCL (CARDIAC) 20 MG/ML IV SOLN
INTRAVENOUS | Status: DC | PRN
  Administered 2014-09-25: 30 mg via INTRAVENOUS

## 2014-09-25 MED ORDER — LISINOPRIL 20 MG PO TABS
20.0000 mg | ORAL_TABLET | Freq: Two times a day (BID) | ORAL | Status: DC
  Administered 2014-09-26 – 2014-09-28 (×6): 20 mg via ORAL
  Filled 2014-09-25 (×6): qty 1

## 2014-09-25 MED ORDER — ALPRAZOLAM 0.5 MG PO TABS
1.0000 mg | ORAL_TABLET | Freq: Three times a day (TID) | ORAL | Status: DC | PRN
  Administered 2014-09-25 – 2014-09-27 (×3): 1 mg via ORAL
  Filled 2014-09-25 (×3): qty 2

## 2014-09-25 MED ORDER — PROMETHAZINE HCL 25 MG/ML IJ SOLN
INTRAMUSCULAR | Status: AC
  Administered 2014-09-25: 6.25 mg
  Filled 2014-09-25: qty 1

## 2014-09-25 MED ORDER — HEMOSTATIC AGENTS (NO CHARGE) OPTIME
TOPICAL | Status: DC | PRN
  Administered 2014-09-25: 1 via TOPICAL

## 2014-09-25 MED ORDER — HYDROMORPHONE HCL 1 MG/ML IJ SOLN
0.5000 mg | INTRAMUSCULAR | Status: DC | PRN
  Administered 2014-09-25 (×2): 0.5 mg via INTRAVENOUS

## 2014-09-25 MED ORDER — MENTHOL 3 MG MT LOZG: 1.0000 | LOZENGE | OROMUCOSAL | Status: DC | PRN

## 2014-09-25 MED ORDER — FENTANYL CITRATE 0.05 MG/ML IJ SOLN: 25.0000 ug | INTRAMUSCULAR | Status: DC | PRN

## 2014-09-25 MED ORDER — ONDANSETRON HCL 4 MG/2ML IJ SOLN
INTRAMUSCULAR | Status: AC
  Filled 2014-09-25: qty 2

## 2014-09-25 MED ORDER — HYDROCHLOROTHIAZIDE 12.5 MG PO CAPS
12.5000 mg | ORAL_CAPSULE | Freq: Two times a day (BID) | ORAL | Status: DC
  Administered 2014-09-26 – 2014-09-28 (×6): 12.5 mg via ORAL
  Filled 2014-09-25 (×6): qty 1

## 2014-09-25 MED ORDER — FENTANYL CITRATE (PF) 250 MCG/5ML IJ SOLN: INTRAMUSCULAR | Status: DC | PRN

## 2014-09-25 MED ORDER — CEFAZOLIN SODIUM 1-5 GM-% IV SOLN
1.0000 g | Freq: Three times a day (TID) | INTRAVENOUS | Status: AC
  Administered 2014-09-26 (×2): 1 g via INTRAVENOUS
  Filled 2014-09-25 (×4): qty 50

## 2014-09-25 MED ORDER — SODIUM CHLORIDE 0.9 % IJ SOLN: 3.0000 mL | INTRAMUSCULAR | Status: DC | PRN

## 2014-09-25 MED ORDER — HYDROCODONE-ACETAMINOPHEN 10-325 MG PO TABS
1.0000 | ORAL_TABLET | ORAL | Status: DC | PRN
  Administered 2014-09-26: 2 via ORAL
  Administered 2014-09-26: 1 via ORAL
  Administered 2014-09-26: 2 via ORAL
  Administered 2014-09-26: 1 via ORAL
  Administered 2014-09-27 (×3): 2 via ORAL
  Filled 2014-09-25 (×4): qty 2
  Filled 2014-09-25: qty 1
  Filled 2014-09-25 (×2): qty 2

## 2014-09-25 MED ORDER — LEVOTHYROXINE SODIUM 100 MCG PO TABS
100.0000 ug | ORAL_TABLET | Freq: Every day | ORAL | Status: DC
  Administered 2014-09-26 – 2014-09-28 (×3): 100 ug via ORAL
  Filled 2014-09-25 (×3): qty 1

## 2014-09-25 MED ORDER — ARTIFICIAL TEARS OP OINT
TOPICAL_OINTMENT | OPHTHALMIC | Status: DC | PRN
  Administered 2014-09-25: 1 via OPHTHALMIC

## 2014-09-25 MED ORDER — PROPOFOL 10 MG/ML IV BOLUS
INTRAVENOUS | Status: DC | PRN
  Administered 2014-09-25: 140 mg via INTRAVENOUS

## 2014-09-25 MED ORDER — FENTANYL CITRATE (PF) 250 MCG/5ML IJ SOLN
INTRAMUSCULAR | Status: AC
  Filled 2014-09-25: qty 5

## 2014-09-25 MED ORDER — GLYCOPYRROLATE 0.2 MG/ML IJ SOLN
INTRAMUSCULAR | Status: AC
  Filled 2014-09-25: qty 2

## 2014-09-25 MED ORDER — HYDROMORPHONE HCL 1 MG/ML IJ SOLN
INTRAMUSCULAR | Status: AC
  Filled 2014-09-25: qty 1

## 2014-09-25 MED ORDER — PROPOFOL 10 MG/ML IV BOLUS
INTRAVENOUS | Status: AC
  Filled 2014-09-25: qty 20

## 2014-09-25 MED ORDER — MIDAZOLAM HCL 2 MG/2ML IJ SOLN
INTRAMUSCULAR | Status: AC
  Filled 2014-09-25: qty 2

## 2014-09-25 MED ORDER — MIDAZOLAM HCL 5 MG/5ML IJ SOLN
INTRAMUSCULAR | Status: DC | PRN
  Administered 2014-09-25: 2 mg via INTRAVENOUS

## 2014-09-25 MED ORDER — OXYCODONE HCL 5 MG PO TABS: 5.0000 mg | ORAL_TABLET | Freq: Once | ORAL | Status: DC | PRN

## 2014-09-25 MED ORDER — METHOCARBAMOL 1000 MG/10ML IJ SOLN
500.0000 mg | Freq: Four times a day (QID) | INTRAVENOUS | Status: DC | PRN
  Administered 2014-09-25: 500 mg via INTRAVENOUS
  Filled 2014-09-25 (×3): qty 5

## 2014-09-25 MED ORDER — ACETAMINOPHEN 10 MG/ML IV SOLN
1000.0000 mg | INTRAVENOUS | Status: AC
  Administered 2014-09-25: 1000 mg via INTRAVENOUS
  Filled 2014-09-25: qty 100

## 2014-09-25 MED ORDER — AMLODIPINE BESYLATE 10 MG PO TABS
10.0000 mg | ORAL_TABLET | Freq: Every day | ORAL | Status: DC
  Administered 2014-09-26 – 2014-09-28 (×3): 10 mg via ORAL
  Filled 2014-09-25 (×3): qty 1

## 2014-09-25 MED ORDER — OXYCODONE HCL 5 MG PO TABS
5.0000 mg | ORAL_TABLET | Freq: Once | ORAL | Status: DC | PRN
Start: 2014-09-25 — End: 2014-09-25

## 2014-09-25 MED ORDER — SODIUM CHLORIDE 0.9 % IV SOLN: 250.0000 mL | INTRAVENOUS | Status: DC

## 2014-09-25 SURGICAL SUPPLY — 65 items
BNDG GAUZE ELAST 4 BULKY (GAUZE/BANDAGES/DRESSINGS) ×2 IMPLANT
BUR EGG ELITE 4.0 (BURR) IMPLANT
CORDS BIPOLAR (ELECTRODE) ×2 IMPLANT
DEVICE COFLEX STABLIZATION 12M (Neuro Prosthesis/Implant) ×4 IMPLANT
DRAIN HEMOVAC 7FR (DRAIN) ×2 IMPLANT
DRAIN WOUND SNY 15 RND (WOUND CARE) ×2 IMPLANT
DRAPE C-ARM 42X72 X-RAY (DRAPES) ×2 IMPLANT
DRAPE POUCH INSTRU U-SHP 10X18 (DRAPES) ×2 IMPLANT
DRAPE SURG 17X11 SM STRL (DRAPES) ×2 IMPLANT
DRAPE U-SHAPE 47X51 STRL (DRAPES) ×2 IMPLANT
DRSG MEPILEX BORDER 4X4 (GAUZE/BANDAGES/DRESSINGS) ×2 IMPLANT
DURAPREP 26ML APPLICATOR (WOUND CARE) ×2 IMPLANT
DURASEAL APPLICATOR TIP (TIP) ×2 IMPLANT
DURASEAL SPINE SEALANT 3ML (MISCELLANEOUS) ×2 IMPLANT
ELECT BLADE 4.0 EZ CLEAN MEGAD (MISCELLANEOUS) ×2
ELECT CAUTERY BLADE 6.4 (BLADE) ×2 IMPLANT
ELECT PENCIL ROCKER SW 15FT (MISCELLANEOUS) ×2 IMPLANT
ELECT REM PT RETURN 9FT ADLT (ELECTROSURGICAL) ×2
ELECTRODE BLDE 4.0 EZ CLN MEGD (MISCELLANEOUS) ×1 IMPLANT
ELECTRODE REM PT RTRN 9FT ADLT (ELECTROSURGICAL) ×1 IMPLANT
EVACUATOR SILICONE 100CC (DRAIN) ×2 IMPLANT
FLOSEAL (HEMOSTASIS) IMPLANT
GLOVE BIO SURGEON STRL SZ 6.5 (GLOVE) ×2 IMPLANT
GLOVE BIOGEL PI IND STRL 7.0 (GLOVE) ×1 IMPLANT
GLOVE BIOGEL PI IND STRL 8 (GLOVE) ×1 IMPLANT
GLOVE BIOGEL PI IND STRL 8.5 (GLOVE) ×1 IMPLANT
GLOVE BIOGEL PI INDICATOR 7.0 (GLOVE) ×1
GLOVE BIOGEL PI INDICATOR 8 (GLOVE) ×1
GLOVE BIOGEL PI INDICATOR 8.5 (GLOVE) ×1
GLOVE ORTHO TXT STRL SZ7.5 (GLOVE) ×2 IMPLANT
GLOVE SS BIOGEL STRL SZ 8.5 (GLOVE) ×2 IMPLANT
GLOVE SUPERSENSE BIOGEL SZ 8.5 (GLOVE) ×2
GOWN STRL REUS W/ TWL LRG LVL3 (GOWN DISPOSABLE) ×1 IMPLANT
GOWN STRL REUS W/TWL 2XL LVL3 (GOWN DISPOSABLE) ×4 IMPLANT
GOWN STRL REUS W/TWL LRG LVL3 (GOWN DISPOSABLE) ×1
GRAFT DURAGEN MATRIX 1WX1L (Tissue) ×2 IMPLANT
KIT BASIN OR (CUSTOM PROCEDURE TRAY) ×2 IMPLANT
NEEDLE 22X1 1/2 (OR ONLY) (NEEDLE) ×2 IMPLANT
NEEDLE SPNL 18GX3.5 QUINCKE PK (NEEDLE) ×4 IMPLANT
NS IRRIG 1000ML POUR BTL (IV SOLUTION) ×2 IMPLANT
PACK LAMINECTOMY ORTHO (CUSTOM PROCEDURE TRAY) ×2 IMPLANT
PACK UNIVERSAL I (CUSTOM PROCEDURE TRAY) ×2 IMPLANT
PATTIES SURGICAL .5 X.5 (GAUZE/BANDAGES/DRESSINGS) ×2 IMPLANT
PATTIES SURGICAL .5 X1 (DISPOSABLE) ×2 IMPLANT
SPONGE LAP 4X18 X RAY DECT (DISPOSABLE) IMPLANT
SPONGE SURGIFOAM ABS GEL 100 (HEMOSTASIS) ×2 IMPLANT
STAPLER VISISTAT 35W (STAPLE) IMPLANT
STRIP CLOSURE SKIN 1/2X4 (GAUZE/BANDAGES/DRESSINGS) ×2 IMPLANT
SURGIFLO TRUKIT (HEMOSTASIS) IMPLANT
SUT BONE WAX W31G (SUTURE) ×2 IMPLANT
SUT MON AB 3-0 SH 27 (SUTURE) ×1
SUT MON AB 3-0 SH27 (SUTURE) ×1 IMPLANT
SUT VIC AB 1 CT1 18XCR BRD 8 (SUTURE) ×1 IMPLANT
SUT VIC AB 1 CT1 27 (SUTURE) ×2
SUT VIC AB 1 CT1 27XBRD ANTBC (SUTURE) ×2 IMPLANT
SUT VIC AB 1 CT1 8-18 (SUTURE) ×1
SUT VIC AB 2-0 CT1 18 (SUTURE) ×4 IMPLANT
SUT VICRYL 0 CT 1 36IN (SUTURE) ×2 IMPLANT
SUT VICRYL 0 UR6 27IN ABS (SUTURE) IMPLANT
SYR BULB IRRIGATION 50ML (SYRINGE) ×2 IMPLANT
SYR CONTROL 10ML LL (SYRINGE) ×2 IMPLANT
TOWEL OR 17X26 10 PK STRL BLUE (TOWEL DISPOSABLE) ×4 IMPLANT
TRAY FOLEY CATH 16FRSI W/METER (SET/KITS/TRAYS/PACK) IMPLANT
WATER STERILE IRR 1000ML POUR (IV SOLUTION) ×2 IMPLANT
YANKAUER SUCT BULB TIP NO VENT (SUCTIONS) ×2 IMPLANT

## 2014-09-25 NOTE — H&P (Signed)
History of Present Illness The patient is a 69 year old female who comes in today for a preoperative History and Physical. The patient is scheduled for a Decompression L3-5 w/insertion of Coflex to be performed by Dr. Duane Lope D. Rolena Infante, MD at [facility] on 09/25/2014 . Please see the hospital record for complete dictated history and physical.  Additional reasons for visit:  Follow-up back is described as the following: The patient is being followed for their back pain. Symptoms reported today include: pain (bilat buttock and right leg), aching, numbness (into the right foot) and leg pain. The patient states that they are doing poorly. The following medication has been used for pain control: Hydrocodone (5-300 mg Dr Nevada Crane , PCP in [place]). The patient reports their current pain level to be 9-10 / 10 (right now).  Allergies  Morphine Derivatives Sulfa Antibiotics  Family History Cancer mother and sister Congestive Heart Failure father Hypertension mother and brother Osteoarthritis mother  Social History  Marital status married Living situation live with spouse Pain Contract no Tobacco use former smoker Tobacco / smoke exposure no Illicit drug use no Children 3 Alcohol use never consumed alcohol Current work status retired Engineer, agricultural (Previously) no Drug/Alcohol Rehab (Currently) no  Medication History ALPRAZolam (1MG  Tablet, Oral) Active. (tid) Ibuprofen (200MG  Tablet, Oral) Active. Lisinopril-Hydrochlorothiazide (20-12.5MG  Tablet, Oral) Active. (qd) AmLODIPine Besylate (10MG  Tablet, Oral) Active. (bid) Hydrocodone-Acetaminophen (5-300MG  Tablet, Oral) Active. (prn Rx'd by PCP) Levothyroxine Sodium (100MCG Tablet, Oral) Active. (qd) Medications Reconciled  Vitals  09/22/2014 1:14 PM Temp.: 98.65F  Weight: 181.03 lb Height: 63in Body Surface Area: 1.85 m Body Mass Index: 32.07 kg/m  Pulse: 84 (Regular)  BP: 172/94  (Sitting, Left Arm, Standard)  Objective Transcription She is a pleasant woman. She appears younger than her stated stage. She is alert. She is oriented x3. No shortness of breath or chest pain. Lung fields are clear to auscultation. Abdomen soft to nontender. No loss of bowel or bladder control. She continues to have severe low back pain, especially with extension to the spine, relief with forward flexion. Compartments are soft and nontender. She has neurogenic claudication pain in both lower extremities. She has no foot drop, no focal motor deficits.   RADIOGRAPHS: MRI, 08-19-14 which I ordered, demonstrates significant spina stenosis at L4-5 and L3-4. There is also severe bilateral facet arthrosis at L4-5, worse on the left side. This causes foraminal stenosis. There is degenerative disk disease at L5-S1 as well. She has the previous compression type deformity at T10 posterior disk protrusion at T11-12, no cord compression and no significant stenosis.  Assessment & Plan Spinal stenosis, lumbar (M48.06) Current Plans  Midline low back pain with right-sided sciatica (M54.41)  Plans Transcription We are moving forward with the lumbar decompression at L3-L4, L4-L5 with insertion of the Coflex device to help prevent recurrence of the spinal stenosis. Again, while there is degenerative disc disease at L5-S1, her biggest complaint is the neurogenic claudication pain, which is the pain that goes from the buttocks into the legs and that is what is inhibiting her quality of life. The goal of surgery is essentially to make her feel like she is in a forward flexed position which is her current position of comfort, which is really standing in a neutral erect position. This should allow better ambulation, better improved quality of life and less pain. We have gone over the risks of surgery, which include infection, bleeding, nerve damage, death, stroke, paralysis, failure to heal, need for further  surgery,  ongoing or worst pain, migration of the Coflex device, inability to put the Coflex device in because of a spinous process fracture. We also talked about the fact that she is [employer] and at current, she says it should be okay if we decide to use Cell Saver, but I have asked her to check with her pastor to make sure that that is the case.

## 2014-09-25 NOTE — Transfer of Care (Signed)
Immediate Anesthesia Transfer of Care Note  Patient: Karen [name]  Procedure(s) Performed: Procedure(s): DECOMPRESSION L3-L5 WITH INSERTION OF COFLEX   ( 2 LEVELS) (N/A)  Patient Location: PACU  Anesthesia Type:General  Level of Consciousness: awake  Airway & Oxygen Therapy: Patient Spontanous Breathing and Patient connected to nasal cannula oxygen  Post-op Assessment: Report given to RN, Post -op Vital signs reviewed and stable and Patient moving all extremities  Post vital signs: Reviewed and stable  Last Vitals:  Filed Vitals:   09/25/14 0928  BP: 133/69  Pulse: 77  Temp: 36.8 C  Resp: 18    Complications: No apparent anesthesia complications

## 2014-09-25 NOTE — Anesthesia Procedure Notes (Signed)
Procedure Name: Intubation Date/Time: 09/25/2014 1:35 PM Performed by: Jenne Campus Pre-anesthesia Checklist: Patient identified, Emergency Drugs available, Suction available, Patient being monitored and Timeout performed Patient Re-evaluated:Patient Re-evaluated prior to inductionOxygen Delivery Method: Circle system utilized Preoxygenation: Pre-oxygenation with 100% oxygen Intubation Type: IV induction Ventilation: Mask ventilation without difficulty and Oral airway inserted - appropriate to patient size Laryngoscope Size: Mac and 3 Grade View: Grade I Tube type: Oral Tube size: 7.0 mm Number of attempts: 1 Airway Equipment and Method: Stylet and LTA kit utilized Placement Confirmation: ETT inserted through vocal cords under direct vision,  positive ETCO2,  CO2 detector and breath sounds checked- equal and bilateral Secured at: 21 cm Tube secured with: Tape Dental Injury: Teeth and Oropharynx as per pre-operative assessment

## 2014-09-25 NOTE — Anesthesia Preprocedure Evaluation (Addendum)
Anesthesia Evaluation  Patient identified by MRN, date of birth, ID band Patient awake    Reviewed: Allergy & Precautions, NPO status , Patient's Chart, lab work & pertinent test results  Airway Mallampati: II  TM Distance: >3 FB Neck ROM: Full    Dental  (+) Partial Upper, Dental Advisory Given   Pulmonary former smoker,  breath sounds clear to auscultation        Cardiovascular hypertension, Rhythm:Regular Rate:Normal     Neuro/Psych    GI/Hepatic   Endo/Other    Renal/GU      Musculoskeletal   Abdominal   Peds  Hematology   Anesthesia Other Findings   Reproductive/Obstetrics                            Anesthesia Physical Anesthesia Plan  ASA: III  Anesthesia Plan: General   Post-op Pain Management:    Induction: Intravenous  Airway Management Planned: Oral ETT  Additional Equipment:   Intra-op Plan:   Post-operative Plan:   Informed Consent: I have reviewed the patients History and Physical, chart, labs and discussed the procedure including the risks, benefits and alternatives for the proposed anesthesia with the patient or authorized representative who has indicated his/her understanding and acceptance.   Dental advisory given  Plan Discussed with: CRNA and Anesthesiologist  Anesthesia Plan Comments: (Jehova's Witness, refuses blood products, cell saver OK Anxiety Hypertension   Plan GA with oral ETT  Karen [name])        Anesthesia Quick Evaluation

## 2014-09-25 NOTE — Anesthesia Postprocedure Evaluation (Signed)
  Anesthesia Post-op Note  Patient: Karen [name]  Procedure(s) Performed: Procedure(s): DECOMPRESSION L3-L5 WITH INSERTION OF COFLEX   ( 2 LEVELS) (N/A)  Patient Location: PACU  Anesthesia Type: General   Level of Consciousness: awake, alert  and oriented  Airway and Oxygen Therapy: Patient Spontanous Breathing  Post-op Pain: mild  Post-op Assessment: Post-op Vital signs reviewed  Post-op Vital Signs: Reviewed  Last Vitals:  Filed Vitals:   09/25/14 1915  BP:   Pulse: 80  Temp:   Resp: 20    Complications: No apparent anesthesia complications

## 2014-09-25 NOTE — Brief Op Note (Signed)
09/25/2014  5:21 PM  PATIENT:  Karen [name]  69 y.o. female  PRE-OPERATIVE DIAGNOSIS:  SPINAL STENOSIS L3-L5  POST-OPERATIVE DIAGNOSIS:  SPINAL STENOSIS L3-L5  PROCEDURE:  Procedure(s): DECOMPRESSION L3-L5 WITH INSERTION OF COFLEX   ( 2 LEVELS) (N/A)  SURGEON:  Surgeon(s) and Role:    * Melina Schools, MD - Primary  PHYSICIAN ASSISTANT:   ASSISTANTS: none   ANESTHESIA:   general  EBL:  Total I/O In: 2000 [I.V.:2000] Out: 350 [Urine:150; Blood:200]  BLOOD ADMINISTERED:none  DRAINS: none   LOCAL MEDICATIONS USED:  MARCAINE     SPECIMEN:  No Specimen  DISPOSITION OF SPECIMEN:  N/A  COUNTS:  YES  TOURNIQUET:  * No tourniquets in log *  DICTATION: .Other Dictation: Dictation Number [id number]  PLAN OF CARE: Admit to inpatient   PATIENT DISPOSITION:  PACU - hemodynamically stable.

## 2014-09-26 MED ORDER — HYDROCODONE-ACETAMINOPHEN 10-325 MG PO TABS: 1.0000 | ORAL_TABLET | ORAL | Status: DC | PRN

## 2014-09-26 MED ORDER — ONDANSETRON HCL 4 MG PO TABS: 4.0000 mg | ORAL_TABLET | Freq: Three times a day (TID) | ORAL | Status: DC | PRN

## 2014-09-26 MED ORDER — METHOCARBAMOL 500 MG PO TABS: 500.0000 mg | ORAL_TABLET | Freq: Three times a day (TID) | ORAL | Status: DC | PRN

## 2014-09-26 MED ORDER — HYDROCORTISONE 1 % EX CREA
TOPICAL_CREAM | Freq: Two times a day (BID) | CUTANEOUS | Status: DC | PRN
  Administered 2014-09-26: 1 via TOPICAL
  Filled 2014-09-26: qty 28

## 2014-09-26 NOTE — Evaluation (Signed)
Occupational Therapy Evaluation Patient Details Name: Karen [name] MRN: [id number] DOB: November 16, 1945 Today's Date: 09/26/2014    History of Present Illness Pt admitted for DECOMPRESSION L3-L5 WITH INSERTION OF COFLEX   Clinical Impression   Pt making steady progress and currently presents at an overall min guard assist for toileting tasks and transfer.  Greater assist needed for LB selfcare tasks secondary to back precautions and decreased flexibility, but feel these can be addressed with AE.  Recommend continued OT to help increase strength and independence with all selfcare tasks in preparation for returning home.  Do not anticipate any follow-up post acute OT.      Follow Up Recommendations  No OT follow up;Supervision/Assistance - 24 hour    Equipment Recommendations  Other (comment) (Pt to likely purchase AE)       Precautions / Restrictions Precautions Precautions: Back Required Braces or Orthoses: Spinal Brace Spinal Brace: Lumbar corset;Applied in sitting position Restrictions Weight Bearing Restrictions: No      Mobility Bed Mobility Overal bed mobility: Needs Assistance Bed Mobility: Rolling;Sidelying to Sit Rolling: Min assist Sidelying to sit: Min assist          Transfers Overall transfer level: Needs assistance Equipment used: Rolling walker (2 wheeled) Transfers: Sit to/from Stand Sit to Stand: Min guard;From elevated surface         General transfer comment: mod instructional cueing for hand placement with sit to stand and stand to sit.    Balance Overall balance assessment: Needs assistance   Sitting balance-Leahy Scale: Fair       Standing balance-Leahy Scale: Fair Standing balance comment: Pt able to maintain standing balance at the sink with min guard assist.                             ADL Overall ADL's : Needs assistance/impaired Eating/Feeding: Independent   Grooming: Wash/dry hands;Wash/dry face;Min guard;Standing   Upper Body Bathing: Supervision/ safety;Sitting   Lower Body Bathing: Moderate assistance;Sit to/from stand   Upper Body Dressing : Minimal assistance;Sitting   Lower Body Dressing: Maximal assistance;Sit to/from stand   Toilet Transfer: Min guard;Grab bars;Comfort height toilet;Ambulation;RW   Toileting- Clothing Manipulation and Hygiene: Minimal assistance;Sit to/from stand       Functional mobility during ADLs: Min guard;Rolling walker General ADL Comments: Pt educated and handout provided on back precautions.  Educated on AE as well with some practice initiated using the sockaide.  Will have 24 hour supervision at initial discharge.  She reports having all DME from taking care of her mother, but plans to purchase AE.     Vision Vision Assessment?: No apparent visual deficits   Perception Perception Perception Tested?: No   Praxis Praxis Praxis tested?: Within functional limits    Pertinent Vitals/Pain Pain Assessment: 0-10 Pain Score: 5  Pain Location: back Pain Intervention(s): Limited activity within patient's tolerance;Premedicated before session     Hand Dominance Right   Extremity/Trunk Assessment Upper Extremity Assessment Upper Extremity Assessment: Overall WFL for tasks assessed   Lower Extremity Assessment Lower Extremity Assessment: Defer to PT evaluation   Cervical / Trunk Assessment Cervical / Trunk Assessment: Normal   Communication Communication Communication: No difficulties   Cognition Arousal/Alertness: Awake/alert Behavior During Therapy: WFL for tasks assessed/performed Overall Cognitive Status: Within Functional Limits for tasks assessed  Home Living Family/patient expects to be discharged to:: Private residence Living Arrangements: Children;Spouse/significant other (grandson) Available Help at Discharge: Available 24 hours/day (both work during the day but daughter is coming to stay) Type of  Home: House Home Access: Stairs to enter Technical brewer of Steps: 3 Entrance Stairs-Rails: None Home Layout: Two level;Able to live on main level with bedroom/bathroom     Bathroom Shower/Tub: Tub/shower unit;Curtain   Biochemist, clinical: Standard Bathroom Accessibility: Yes How Accessible: Accessible via walker Home Equipment: Grab bars - toilet;Grab bars - tub/shower          Prior Functioning/Environment          Comments: Used cane prior to surgery for the past 3 months, before that did not need assistive device    OT Diagnosis: Generalized weakness;Acute pain   OT Problem List: Decreased strength;Impaired balance (sitting and/or standing);Pain;Decreased knowledge of use of DME or AE;Decreased knowledge of precautions   OT Treatment/Interventions: Self-care/ADL training;Balance training;Therapeutic activities;DME and/or AE instruction;Patient/family education    OT Goals(Current goals can be found in the care plan section) Acute Rehab OT Goals Patient Stated Goal: Pt did not state but requested to go to the bathroom. OT Goal Formulation: With patient/family Time For Goal Achievement: 10/03/14 Potential to Achieve Goals: Good  OT Frequency: Min 2X/week              End of Session Equipment Utilized During Treatment: Rolling walker;Back brace Nurse Communication: Mobility status  Activity Tolerance: Patient tolerated treatment well Patient left: in chair;with call bell/phone within reach;with family/visitor present   Time: 0910-0958 OT Time Calculation (min): 48 min Charges:  OT General Charges $OT Visit: 1 Procedure OT Evaluation $Initial OT Evaluation Tier I: 1 Procedure OT Treatments $Self Care/Home Management : 38-52 mins  Riku Buttery OTR/L 09/26/2014, 10:26 AM

## 2014-09-26 NOTE — Evaluation (Signed)
Physical Therapy Evaluation Patient Details Name: Karen [name] MRN: [id number] DOB: Feb 13, 1946 Today's Date: 09/26/2014   History of Present Illness  69 y.o. female admitted to [facility] on 09/25/14 for elective L3-5 decompression and insertion of coflex, and possible CSF leak.  Pt with significant PMHx of HTN, and breast CA with bil mastectomy.  Clinical Impression  Pt is progressing well with her mobility. I reinforced back precaution education initiated by OT and she was able to walk a good distance down the hall with RW at supervision level. PT tomorrow will need to practice stairs and reinforce education.  She will not need f/u PT at discharge, but will need her own RW.     Follow Up Recommendations No PT follow up    Equipment Recommendations  Rolling walker with 5" wheels    Recommendations for Other Services   NA    Precautions / Restrictions Precautions Precautions: Back Precaution Booklet Issued: Yes (comment) Precaution Comments: handout given and precautions reviewed.  Required Braces or Orthoses: Spinal Brace Spinal Brace: Lumbar corset;Applied in sitting position      Mobility  Bed Mobility Overal bed mobility: Needs Assistance Bed Mobility: Rolling;Sidelying to Sit;Sit to Sidelying Rolling: Supervision Sidelying to sit: Supervision     Sit to sidelying: Min assist General bed mobility comments: supervision for safety and cues needed for correct log roll technique to get to EOB.  Pt using bed rail.  HOB flat.  No rail and HOB flat to get back into bed.  Discussed reverse log roll technique and assist needed of second leg to get back into bed.   Transfers Overall transfer level: Needs assistance Equipment used: Rolling walker (2 wheeled) Transfers: Sit to/from Stand Sit to Stand: Supervision         General transfer comment: supervision for safety.   Ambulation/Gait Ambulation/Gait assistance: Supervision Ambulation Distance (Feet): 200 Feet Assistive  device: Rolling walker (2 wheeled) Gait Pattern/deviations: Step-through pattern;Trunk flexed     General Gait Details: Verbal cues for upright posture.  Pt using RW safely.          Balance Overall balance assessment: Needs assistance Sitting-balance support: Feet supported;No upper extremity supported Sitting balance-Leahy Scale: Good       Standing balance-Leahy Scale: Fair                               Pertinent Vitals/Pain Pain Assessment: 0-10 Pain Score: 4  Pain Location: incisional Pain Descriptors / Indicators: Burning;Aching Pain Intervention(s): Limited activity within patient's tolerance;Monitored during session;Repositioned    Home Living Family/patient expects to be discharged to:: Private residence Living Arrangements: Children;Spouse/significant other (grandson) Available Help at Discharge: Available 24 hours/day (both work during the day but daughter is coming to stay) Type of Home: House Home Access: Stairs to enter Entrance Stairs-Rails: None Entrance Stairs-Number of Steps: 3 Home Layout: Two level;Able to live on main level with bedroom/bathroom Home Equipment: Grab bars - toilet;Grab bars - tub/shower;Walker - standard      Prior Function Level of Independence: Independent with assistive device(s)         Comments: used cane for gait     Hand Dominance   Dominant Hand: Right    Extremity/Trunk Assessment   Upper Extremity Assessment: Defer to OT evaluation           Lower Extremity Assessment: RLE deficits/detail RLE Deficits / Details: some functional signs of residual right leg weakness compared to left (  some mild instability and leaning a bit more to the right side during gait), but pt reports significant resolution of right LE pain compared to pre-operatively.     Cervical / Trunk Assessment: Normal  Communication   Communication: No difficulties  Cognition Arousal/Alertness: Awake/alert Behavior During  Therapy: WFL for tasks assessed/performed Overall Cognitive Status: Within Functional Limits for tasks assessed                               Assessment/Plan    PT Assessment Patient needs continued PT services  PT Diagnosis Difficulty walking;Abnormality of gait;Generalized weakness;Acute pain   PT Problem List Decreased strength;Decreased activity tolerance;Decreased balance;Decreased mobility;Decreased knowledge of use of DME;Decreased knowledge of precautions;Pain  PT Treatment Interventions DME instruction;Gait training;Stair training;Functional mobility training;Therapeutic activities;Therapeutic exercise;Neuromuscular re-education;Balance training;Patient/family education;Modalities   PT Goals (Current goals can be found in the Care Plan section) Acute Rehab PT Goals Patient Stated Goal: pt wants to go home tomorrow morning.  PT Goal Formulation: With patient Time For Goal Achievement: 10/03/14 Potential to Achieve Goals: Good    Frequency Min 5X/week    End of Session Equipment Utilized During Treatment: Back brace Activity Tolerance: Patient limited by fatigue;Patient limited by pain Patient left: in bed;with call bell/phone within reach           Time: 0867-6195 PT Time Calculation (min) (ACUTE ONLY): 23 min   Charges:   PT Evaluation $Initial PT Evaluation Tier I: 1 Procedure PT Treatments $Gait Training: 8-22 mins        Prairie Stenberg B. [place], [place], DPT [phone]   09/26/2014, 3:57 PM

## 2014-09-26 NOTE — Op Note (Signed)
[name], [name]                 ACCOUNT NO.:  [id number]  MEDICAL RECORD NO.:  [id number]  LOCATION:  5N14C                        FACILITY:  [facility]  PHYSICIAN:  Dahlia Bailiff, MD    DATE OF BIRTH:  [birth year]  DATE OF PROCEDURE:  09/25/2014 DATE OF DISCHARGE:                              OPERATIVE REPORT   PREOPERATIVE DIAGNOSIS:  Lumbar spinal stenosis, L3-4, L4-5.  POSTOPERATIVE DIAGNOSIS:  Lumbar spinal stenosis, L3-4, L4-5.  OPERATIVE PROCEDURE: 1. Lumbar decompression L3-4, L4-5 lateral recess and centrally. 2. Insertion of coflex intervertebral interspinous process device. 3. Application of DuraGen for potential CSF leak.  HISTORY:  This is a very pleasant 69 year old woman who was in her usual state of health who has had progressive debilitating back, buttock, and neuropathic leg pain, right side, worse than left.  After attempts at conservative management have failed to alleviate her symptoms, we elected to proceed with surgery.  After discussing options, she elected to proceed with a central decompression and insertion of the coflex device.  The patient was concerned because of the total disks that she could have a recurrent spinal stenosis, so we used coflex to help minimize this.  All appropriate risks, benefits, and alternatives to surgery were discussed with the patient and consent was obtained.  OPERATIVE NOTE:  The patient was brought to the operating room, placed supine on the operating table.  After successful induction of general anesthesia and endotracheal intubation, TEDs, SCDs, and Foley were inserted.  She was turned prone onto the McGregor frame.  All bony prominences were well padded.  The back was prepped and draped in a standard fashion.  Time-out was taken to confirm the patient, procedure, and all other pertinent important data.  Once this was completed, I used C-arm to identify the C3-4 and C4-5 interspinous process spaces.  Once this was done,  I infiltrated both midline incision sites spanning its disks and made a midline incision and sharply dissected down to the deep fascia.  I then incised the deep fascia on either side of the spinous process and sharply dissected down to the spinous process.  I then used a Cobb elevator, and stripped the paraspinal muscles to expose the L3, L4, and L5 spinous processes and lamina.  I carried my dissection out laterally to the level of the facet complex.  Once this was achieved bilaterally, I noticed there was on the right side significant facet arthrosis.  I identified the L4 and L3 lamina using lateral fluoroscopy.  Once this was done, I resected the inferior portion of the L4 spinous process and superior portion of the L5.  I then used a lamina spreader, opened the disk space and then developed a plane underneath the lamina of L4 with a neuro curette.  We used a 2 and 3 mm Kerrison to perform a generous laminotomy of L4 in the center region.  I then used a Penfield 4 and I dissected through the central raphe and then began resecting the ligamentum flavum from the center to the left.  Once I had opened this up, I was able to palpate into the lateral recess with a Penfield 4 and resect further  osteophyte and ligamentum flavum from the lateral recess and then traveled inferiorly to perform a foraminotomy.  Once this was done, I could easily take my [facility] elevator down the lateral recess towards the L5 foramen and superiorly.  At this point, the relatively asymptomatic side was complete and I started on the more symptomatic side.  I again used the plane that I already created and began resecting the ligamentum flavum from the central region.  I then noted that in the lateral recess the dura was quite thin and was under significant pressure.  I used a neural patty to aid in my dissection.  I placed this in the space between the thecal sac and the ligamentum flavum and then resected the  ligamentum flavum as well as the osteophytes from the inferior L4 facet complex.  I then went into the lateral recess itself and resected the lateral osteophyte from the superior aspect of the L5 facet.  I could then palpate down into the foramen and superiorly into the lateral recess.  At this point, I irrigated the wound copiously with normal saline.  Hemostasis was obtained using bipolar electrocautery.  I then became concerned with the area of thinned CSF.  With Valsalva, there was no CSF leak, but I was concerned that as the canal started to re-expand now that was decompressed that it could tear on a bone fragment.  I elected to put a dural patch over the area of concern and then seal it with DuraSeal.  This was done, and again there was no actual CSF leak.  I then went to the L3-4 space and using the same technique that I had used at 4-5, I performed central decompression as well as lateral recess decompression and foraminotomy.  There was less compression to the thecal sac on the right side on this side, but there was significant lateral recess stenosis nonetheless.  Once both levels were decompressed and I obtained hemostasis using bipolar electrocautery and FloSeal, I irrigated the wounds copiously with normal saline, and then measured for the coflex device.  I then used two size 12 coflex devices.  I placed the L4-5 one first and then the L3-4 next.  Both had excellent positioning.  I then crimped and locked them in place.  At this point, I was pleased with the final x-rays.  I irrigated once more and I placed deep drain and some thrombin-soaked Gelfoam patties lateral to each of the cages.  I then closed the deep fascia with interrupted #1 Vicryl sutures, superficial with 2-0 Vicryl sutures, and a 3-0 Monocryl for the skin.  Steri-Strips and dry dressings were applied.  The patient was ultimately extubated and transferred to the PACU without incident. At the end of the case, all  needle and sponge counts were correct. Because I still had concern about a potential CSF leak, I will leave her in Trendelenburg position tonight and elevate her in the morning and allow her to ambulate.  This is more for precaution.     Dahlia Bailiff, MD     DDB/MEDQ  D:  09/25/2014  T:  09/26/2014  Job:  [id number]  cc:   Dahlia Bailiff, MD

## 2014-09-26 NOTE — Progress Notes (Signed)
    Subjective: Procedure(s) (LRB): DECOMPRESSION L3-L5 WITH INSERTION OF COFLEX   ( 2 LEVELS) (N/A) 1 Day Post-Op  Patient reports pain as 4 on 0-10 scale.  Reports decreased leg pain reports incisional back pain   Positive void Negative bowel movement Positive flatus Negative chest pain or shortness of breath  Objective: Vital signs in last 24 hours: Temp:  [97 F (36.1 C)-98.3 F (36.8 C)] 97 F (36.1 C) (04/15 0504) Pulse Rate:  [65-87] 65 (04/15 0504) Resp:  [15-26] 19 (04/14 1957) BP: (113-157)/(59-81) 113/59 mmHg (04/15 0504) SpO2:  [87 %-100 %] 96 % (04/15 0504) Weight:  [81.704 kg (180 lb 2 oz)] 81.704 kg (180 lb 2 oz) (04/14 0928)  Intake/Output from previous day: 04/14 0701 - 04/15 0700 In: 3017.2 [P.O.:40; I.V.:2622.2; IV Piggyback:355] Out: 1880 [Urine:1610; Emesis/NG output:50; Drains:20; Blood:200]  Labs:  Recent Labs  09/24/14 0946  WBC 10.2  RBC 4.15  HCT 39.1  PLT 268    Recent Labs  09/24/14 0946  NA 141  K 3.1*  CL 100  CO2 32  BUN 14  CREATININE 0.80  GLUCOSE 106*  CALCIUM 9.6   No results for input(s): LABPT, INR in the last 72 hours.  Physical Exam: Neurologically intact ABD soft Intact pulses distally Incision: dressing C/D/I Compartment soft no headache, no clear drainage noted  Assessment/Plan: Patient stable  xrays n/a Continue mobilization with physical therapy Continue care  Advance diet Up with therapy D/C IV fluids  Patient doing well.  Mobilization today.  No radicular leg pain.  Only complaint is incisional back pain. If patient develops headache when ambulating/upright will return to flat and re-eval for CSF leak. Possible d/c to home Saturday or Sunday depending upon progress with therapy  Melina Schools, MD [place] [phone]

## 2014-09-26 NOTE — Anesthesia Postprocedure Evaluation (Signed)
  Anesthesia Post-op Note  Patient: Karen [name]  Procedure(s) Performed: Procedure(s): DECOMPRESSION L3-L5 WITH INSERTION OF COFLEX   ( 2 LEVELS) (N/A)  Patient Location: PACU  Anesthesia Type:General  Level of Consciousness: awake, alert  and oriented  Airway and Oxygen Therapy: Patient Spontanous Breathing and Patient connected to nasal cannula oxygen  Post-op Pain: mild  Post-op Assessment: Post-op Vital signs reviewed, Patient's Cardiovascular Status Stable, Respiratory Function Stable and No signs of Nausea or vomiting  Post-op Vital Signs: stable  Last Vitals:  Filed Vitals:   09/26/14 1300  BP: 113/52  Pulse: 77  Temp: 37.1 C  Resp: 20    Complications: No apparent anesthesia complications

## 2014-09-26 NOTE — Progress Notes (Signed)
CARE MANAGEMENT NOTE 09/26/2014  Patient:  Karen Karen [name], Karen Karen [name]   Account Number:  [id number]  Date Initiated:  09/26/2014  Documentation initiated by:  [facility]  Subjective/Objective Assessment:   s/p L3-4, L4-5 decompression with insertion of interspinous process device     Action/Plan:   PT/OT evals-recommended rolling walker   Anticipated DC Date:  09/27/2014   Anticipated DC Plan:  Karen Karen [name]  CM consult      PAC Choice  DURABLE MEDICAL EQUIPMENT   Choice offered to / List presented to:     DME arranged  Karen Karen [name]      DME agency  [facility].        Status of service:  Completed, signed off Medicare Important Message given?   (If response is "NO", the following Medicare IM given date fields will be blank) Date Medicare IM given:   Medicare IM given by:   Date Additional Medicare IM given:   Additional Medicare IM given by:    Discharge Disposition:  HOME/SELF CARE  Per UR Regulation:  Reviewed for med. necessity/level of care/duration of stay   Comments:  09/26/14 Spoke with patient about d/c plan.Patient states that she has Karen [name] bedside commode but does need Karen [name] rolling walker. She stated that she will have her daughter with her to assist after discharge.Contacted Karen [facility] [name] at [facility] and requested rolling walker be delivered to patient's room today.No other d/c needs identified.

## 2014-09-27 ENCOUNTER — Inpatient Hospital Stay (HOSPITAL_COMMUNITY): Payer: Medicare Other

## 2014-09-27 MED ORDER — HYDROCODONE-ACETAMINOPHEN 5-325 MG PO TABS
1.0000 | ORAL_TABLET | Freq: Four times a day (QID) | ORAL | Status: DC | PRN
  Administered 2014-09-28: 2 via ORAL
  Filled 2014-09-27: qty 2

## 2014-09-27 NOTE — Progress Notes (Signed)
Occupational Therapy Treatment and Discharge Patient Details Name: Karen [name] MRN: [id number] DOB: [birth year] Today's Date: 09/27/2014    History of present illness 69 y.o. female admitted to [facility] on 09/25/14 for elective L3-5 decompression and insertion of coflex, and possible CSF leak.  Pt with significant PMHx of HTN, and breast CA with bil mastectomy.   OT comments  This 69 yo female admitted with above presents to acute OT with all education completed, we will sign off.  Follow Up Recommendations  No OT follow up;Supervision/Assistance - 24 hour    Equipment Recommendations   (Pt aware she can purchase AE in gft shop)       Precautions / Restrictions Precautions Precautions: Back Precaution Comments: Reviewed 3/3 back precautions. Required Braces or Orthoses: Spinal Brace Spinal Brace: Lumbar corset;Applied in sitting position Restrictions Weight Bearing Restrictions: No       Mobility Bed Mobility Overal bed mobility: Needs Assistance Bed Mobility: Sidelying to Sit;Rolling Rolling: Supervision Sidelying to sit: Supervision     Sit to sidelying: Supervision  Transfers Overall transfer level: Needs assistance Equipment used: Rolling walker (2 wheeled) Transfers: Sit to/from Stand Sit to Stand: Min guard                ADL Overall ADL's : Needs assistance/impaired                         Toilet Transfer: Min guard;Ambulation;RW;Comfort height toilet;Grab bars   Toileting- Clothing Manipulation and Hygiene: Min guard;Sit to/from stand         General ADL Comments: Total A to don neoprene back brace. Pt reports that she is aware she can purchase AE in gift shop and does not feel the need to practice with it again. Plan was to have pt go with me down to gym to practice tub/shower transfer to tub seat or bench' however upon returning from bathroom pt started to feel nauseated so I had to help her return to bed. We did verbally go over how to do  it, with her side stepping with knee bent backwards entering from the back of the tub due to that is where her grab bar is (along with one on her long wall) and then turning to face the front with someone then placing her tub seat in behind her for her to sit on. We also dicussed the use of wet wipes for back peri-hygiene to increase her independence and ease with this task.                Cognition   Behavior During Therapy: WFL for tasks assessed/performed Overall Cognitive Status: Within Functional Limits for tasks assessed                                    Pertinent Vitals/ Pain       Pain Assessment: 0-10 Pain Score: 8  Pain Location: low back Pain Descriptors / Indicators: Aching;Sore Pain Intervention(s): Monitored during session;Repositioned (reported that she did not want any more pain meds right now, they make her itch)         Frequency Min 2X/week     Progress Toward Goals  OT Goals(current goals can now be found in the care plan section)  Progress towards OT goals:  (All education completed)     Plan Discharge plan remains appropriate       End of  Session Equipment Utilized During Treatment: Rolling walker;Back brace   Activity Tolerance  (limited by nausea)   Patient Left in bed;with call bell/phone within reach (nursing turned on bed alarm)   Nurse Communication Patient requests pain meds        Time: 3267-1245 OT Time Calculation (min): 24 min  Charges: OT General Charges $OT Visit: 1 Procedure OT Treatments $Self Care/Home Management : 23-37 mins  Almon Register [phone] 09/27/2014, 11:28 AM

## 2014-09-27 NOTE — Progress Notes (Signed)
Orthopedics Progress Note  Subjective: I am so thankful that leg pain is gone  Objective:  Filed Vitals:   09/26/14 1932  BP: 118/106  Pulse: 83  Temp: 99.1 F (37.3 C)  Resp:     General: Awake and alert  Musculoskeletal: lumbar incision clean and dry, no drainage or bleeding, drain pulled, dressing changed Neurovascularly intact  Lab Results  Component Value Date   WBC 10.2 09/24/2014   HGB 12.9 09/24/2014   HCT 39.1 09/24/2014   MCV 94.2 09/24/2014   PLT 268 09/24/2014       Component Value Date/Time   NA 141 09/24/2014 0946   K 3.1* 09/24/2014 0946   CL 100 09/24/2014 0946   CO2 32 09/24/2014 0946   GLUCOSE 106* 09/24/2014 0946   BUN 14 09/24/2014 0946   CREATININE 0.80 09/24/2014 0946   CREATININE 0.82 03/19/2013 1535   CALCIUM 9.6 09/24/2014 0946   GFRNONAA 74* 09/24/2014 0946   GFRAA 86* 09/24/2014 0946    No results found for: INR, PROTIME  Assessment/Plan: POD #2 s/p Procedure(s): DECOMPRESSION L3-L5 WITH INSERTION OF COFLEX   ( 2 LEVELS) Patient stable for discharge home today.  Prescriptions signed. Patient understands precautions.  Doran Heater. Veverly Fells, MD 09/27/2014 10:11 AM

## 2014-09-27 NOTE — Progress Notes (Signed)
Spoke with Merla Riches, PA to update on patient's condition. Discharge held. Chest x-ray ordered and Vicodin order modified.

## 2014-09-27 NOTE — Progress Notes (Signed)
[employer] answering service and left message for [place], [place] regarding patient's hypoxic episode.

## 2014-09-27 NOTE — Progress Notes (Signed)
Contacted [facility] for RW for home. Avraham Benish RN CCM Case Mgmt phone [phone] 

## 2014-09-27 NOTE — Progress Notes (Addendum)
Spoke with Merla Riches, PA to discuss patient's hypoxemic episode. Mr. Doren Custard said to wean patient off oxygen and, if patient's O2 remains above 92% for one half hour on room air, to proceed with discharge. Also discussed changing patient's discharge prescription for Vicodin d/t possibility that hypoxemic episode was opiate induced. Mr Doren Custard does not want change discharge medication regimen at this time. Medication side effects and possible complications discussed with patient and family. Also discussed taking 1 versus 2 pills for pain management in order to prevent over-sedation. Patient and family verbalize understanding and appear eager to follow these instructions.

## 2014-09-27 NOTE — Progress Notes (Signed)
Tried to titrate patient's 02. When on 1.5L [place], patient's O2 kept dropping to 88% when she would doze of. Patient back on 2L [place]. O2 remaining above 90%.

## 2014-09-27 NOTE — Discharge Summary (Signed)
Physician Discharge Summary   Patient ID: Karen [name] MRN: [id number] DOB/AGE: [birth year] 69 y.o.  Admit date: 09/25/2014 Discharge date: 09/27/2014  Admission Diagnoses:  Active Problems:   Spinal stenosis   Discharge Diagnoses:  Same   Surgeries: Procedure(s): DECOMPRESSION L3-L5 WITH INSERTION OF COFLEX   ( 2 LEVELS) on 09/25/2014   Consultants: PT  Discharged Condition: Stable  Hospital Course: Karen [name] is an 69 y.o. female who was admitted 09/25/2014 with a chief complaint of back and leg pain, and found to have a diagnosis of lumbar stenosis.  They were brought to the operating room on 09/25/2014 and underwent the above named procedures.    The patient had an uncomplicated hospital course and was stable for discharge.  Recent vital signs:  Filed Vitals:   09/26/14 1932  BP: 118/106  Pulse: 83  Temp: 99.1 F (37.3 C)  Resp:     Recent laboratory studies:  Results for orders placed or performed during the hospital encounter of 09/24/14  Surgical pcr screen  Result Value Ref Range   MRSA, PCR NEGATIVE NEGATIVE   Staphylococcus aureus NEGATIVE NEGATIVE  Basic metabolic panel  Result Value Ref Range   Sodium 141 135 - 145 mmol/L   Potassium 3.1 (L) 3.5 - 5.1 mmol/L   Chloride 100 96 - 112 mmol/L   CO2 32 19 - 32 mmol/L   Glucose, Bld 106 (H) 70 - 99 mg/dL   BUN 14 6 - 23 mg/dL   Creatinine, Ser 0.80 0.50 - 1.10 mg/dL   Calcium 9.6 8.4 - 10.5 mg/dL   GFR calc non Af Amer 74 (L) >90 mL/min   GFR calc Af Amer 86 (L) >90 mL/min   Anion gap 9 5 - 15  CBC  Result Value Ref Range   WBC 10.2 4.0 - 10.5 K/uL   RBC 4.15 3.87 - 5.11 MIL/uL   Hemoglobin 12.9 12.0 - 15.0 g/dL   HCT 39.1 36.0 - 46.0 %   MCV 94.2 78.0 - 100.0 fL   MCH 31.1 26.0 - 34.0 pg   MCHC 33.0 30.0 - 36.0 g/dL   RDW 13.7 11.5 - 15.5 %   Platelets 268 150 - 400 K/uL  No blood products  Result Value Ref Range   Transfuse no blood products      TRANSFUSE NO BLOOD PRODUCTS, VERIFIED BY  Starleen Arms, RN    Discharge Medications:     Medication List    STOP taking these medications        HYDROcodone-acetaminophen 5-325 MG per tablet  Commonly known as:  NORCO/VICODIN  Replaced by:  HYDROcodone-acetaminophen 10-325 MG per tablet      TAKE these medications        ALPRAZolam 1 MG tablet  Commonly known as:  XANAX  Take 1 mg by mouth 3 (three) times daily as needed for anxiety.     amLODipine 10 MG tablet  Commonly known as:  NORVASC  Take 10 mg by mouth daily.     HYDROcodone-acetaminophen 10-325 MG per tablet  Commonly known as:  NORCO  Take 1-2 tablets by mouth every 4 (four) hours as needed for severe pain.     ibuprofen 200 MG tablet  Commonly known as:  ADVIL,MOTRIN  Take 200 mg by mouth every 6 (six) hours as needed for mild pain or moderate pain.     levothyroxine 100 MCG tablet  Commonly known as:  SYNTHROID, LEVOTHROID  Take 100 mcg by mouth daily.  lisinopril-hydrochlorothiazide 20-12.5 MG per tablet  Commonly known as:  PRINZIDE,ZESTORETIC  Take 1 tablet by mouth 2 (two) times daily.     methocarbamol 500 MG tablet  Commonly known as:  ROBAXIN  Take 1 tablet (500 mg total) by mouth 3 (three) times daily as needed for muscle spasms.     ondansetron 4 MG tablet  Commonly known as:  ZOFRAN  Take 1 tablet (4 mg total) by mouth every 8 (eight) hours as needed for nausea or vomiting.        Diagnostic Studies: Dg Lumbar Spine 2-3 Views  09/25/2014   CLINICAL DATA:  L3-5 decompression with coflex LMP verify prior to exam.  EXAM: DG C-ARM 61-120 MIN; LUMBAR SPINE - 2-3 VIEW  FLUOROSCOPY TIME:  Radiation Exposure Index (as provided by the fluoroscopic device):  If the device does not provide the exposure index:  Fluoroscopy Time (in minutes and seconds):  0 minutes 24 seconds  Number of Acquired Images:  2  COMPARISON:  None.  FINDINGS: Examination demonstrates mild spondylosis of the lumbar spine. There is a grade 1 anterolisthesis of L4 on  L5. There is moderate disc space narrowing at the L5-S1 level. There is been L4 and L5 laminectomy with placement of a metallic device over the posterior soft tissues at the laminectomy site of the L4-5 level.  IMPRESSION: L4-5 laminectomy with placement of metallic hardware at the laminectomy site.  Mild spondylosis with subtle grade 1 anterolisthesis of L4 on L5. Disc disease at the L5-S1 level.   Electronically Signed   By: Marin Olp M.D.   On: 09/25/2014 17:12   Dg C-arm 1-60 Min  09/25/2014   CLINICAL DATA:  L3-5 decompression with coflex LMP verify prior to exam.  EXAM: DG C-ARM 61-120 MIN; LUMBAR SPINE - 2-3 VIEW  FLUOROSCOPY TIME:  Radiation Exposure Index (as provided by the fluoroscopic device):  If the device does not provide the exposure index:  Fluoroscopy Time (in minutes and seconds):  0 minutes 24 seconds  Number of Acquired Images:  2  COMPARISON:  None.  FINDINGS: Examination demonstrates mild spondylosis of the lumbar spine. There is a grade 1 anterolisthesis of L4 on L5. There is moderate disc space narrowing at the L5-S1 level. There is been L4 and L5 laminectomy with placement of a metallic device over the posterior soft tissues at the laminectomy site of the L4-5 level.  IMPRESSION: L4-5 laminectomy with placement of metallic hardware at the laminectomy site.  Mild spondylosis with subtle grade 1 anterolisthesis of L4 on L5. Disc disease at the L5-S1 level.   Electronically Signed   By: Marin Olp M.D.   On: 09/25/2014 17:12    Disposition: 01-Home or Self Care        Follow-up Information    Follow up with Dahlia Bailiff, MD. Schedule an appointment as soon as possible for a visit in 2 weeks.   Specialty:  Orthopedic Surgery   Why:  For suture removal, For wound re-check   Contact information:   [address] [place] [address] [phone]        Signed: Augustin Schooling 09/27/2014, 10:14 AM

## 2014-09-27 NOTE — Progress Notes (Signed)
Continuous pulse ox monitor on. Patient on 2L . O2 saturation remaining > 92%.

## 2014-09-27 NOTE — Progress Notes (Signed)
Physical Therapy Treatment Patient Details Name: Karen [name] MRN: [id number] DOB: 01/23/1946 Today's Date: 09/27/2014    History of Present Illness 69 y.o. female admitted to [facility] on 09/25/14 for elective L3-5 decompression and insertion of coflex, and possible CSF leak.  Pt with significant PMHx of HTN, and breast CA with bil mastectomy.    PT Comments    Pt discharging home today.  All education complete. Pt will need RW for home.  Follow Up Recommendations  No PT follow up     Equipment Recommendations  Rolling walker with 5" wheels    Recommendations for Other Services       Precautions / Restrictions Precautions Precautions: Back Precaution Comments: Reviewed 3/3 back precautions. Required Braces or Orthoses: Spinal Brace Spinal Brace: Lumbar corset;Applied in sitting position Restrictions Weight Bearing Restrictions: No    Mobility  Bed Mobility     Rolling: Modified independent (Device/Increase time) Sidelying to sit: Supervision     Sit to sidelying: Min assist General bed mobility comments: Assist with BLE for sit to sidelying. Verbal cues for sequencing and precautions. HOB flat with no use of bedrails.  Transfers   Equipment used: Rolling walker (2 wheeled)   Sit to Stand: Supervision         General transfer comment: supervision for safety.   Ambulation/Gait Ambulation/Gait assistance: Supervision Ambulation Distance (Feet): 250 Feet Assistive device: Rolling walker (2 wheeled) Gait Pattern/deviations: Step-through pattern;Decreased stride length Gait velocity: decreased       Stairs Stairs: Yes Stairs assistance: Min guard Stair Management: No rails;Forwards;With walker Number of Stairs: 1 General stair comments: Pt has one step up onto porch then 1 step into house with both areas being wide enough for the RW.  Wheelchair Mobility    Modified Rankin (Stroke Patients Only)       Balance                                     Cognition Arousal/Alertness: Awake/alert Behavior During Therapy: WFL for tasks assessed/performed Overall Cognitive Status: Within Functional Limits for tasks assessed                      Exercises      General Comments        Pertinent Vitals/Pain Pain Assessment: 0-10 Pain Score: 4  Pain Location: low back Pain Intervention(s): Monitored during session;Repositioned    Home Living                      Prior Function            PT Goals (current goals can now be found in the care plan section) Progress towards PT goals: Progressing toward goals    Frequency  Min 5X/week    PT Plan Current plan remains appropriate    Co-evaluation             End of Session Equipment Utilized During Treatment: Back brace Activity Tolerance: Patient tolerated treatment well Patient left: in bed;with call bell/phone within reach     Time: 0932-0958 PT Time Calculation (min) (ACUTE ONLY): 26 min  Charges:  $Gait Training: 23-37 mins                    G Codes:      Karen [name] 09/27/2014, 11:10 AM

## 2014-09-27 NOTE — Progress Notes (Signed)
[place] answering service again since have not heard back from [place]. Answering service lady apologized, saying that she forgot to send him the message

## 2014-09-28 NOTE — Discharge Instructions (Signed)
Keep incisions clean and dry and covered for one week, then ok to shower if no drainage.  Follow up with dr Rolena Infante as scheduled.  [phone]

## 2014-09-28 NOTE — Progress Notes (Signed)
Orthopedics Progress Note  Subjective: Patient feeling better today.  She reports getting too much pain medication yesterday and being "out of it".  She feels like she is fine to go home. She has a daughter who will be with her at all times.  Objective:  Filed Vitals:   09/28/14 0607  BP: 108/52  Pulse: 82  Temp: 99.8 F (37.7 C)  Resp: 16    General: Awake and alert  Musculoskeletal: neuro intact including Grade 5 strength in her LEs, sensation intact Neurovascularly intact  Lab Results  Component Value Date   WBC 10.2 09/24/2014   HGB 12.9 09/24/2014   HCT 39.1 09/24/2014   MCV 94.2 09/24/2014   PLT 268 09/24/2014       Component Value Date/Time   NA 141 09/24/2014 0946   K 3.1* 09/24/2014 0946   CL 100 09/24/2014 0946   CO2 32 09/24/2014 0946   GLUCOSE 106* 09/24/2014 0946   BUN 14 09/24/2014 0946   CREATININE 0.80 09/24/2014 0946   CREATININE 0.82 03/19/2013 1535   CALCIUM 9.6 09/24/2014 0946   GFRNONAA 74* 09/24/2014 0946   GFRAA 86* 09/24/2014 0946    No results found for: INR, PROTIME  Assessment/Plan: POD #3 s/p Procedure(s): DECOMPRESSION L3-L5 WITH INSERTION OF COFLEX   ( 2 LEVELS) Stable for discharge today.  Cautioned patient against over medicating. F/u Dr Rolena Infante in two weeks  [place]. Veverly Fells, MD 09/28/2014 8:30 AM

## 2014-09-28 NOTE — Progress Notes (Signed)
[name] discharged home per MD order. Discharge instructions reviewed and discussed with patient. All questions and concerns answered. Copy of instructions and scripts given to patient. IV removed.  Patient escorted to car by staff in a wheelchair. No distress noted upon discharge.   Nicki Reaper Gerty 09/28/2014 11:59 AM

## 2014-09-28 NOTE — Progress Notes (Signed)
Physical Therapy Treatment Patient Details Name: Karen [name] MRN: [id number] DOB: 12-18-1945 Today's Date: 09/28/2014    History of Present Illness 69 y.o. female admitted to [facility] on 09/25/14 for elective L3-5 decompression and insertion of coflex, and possible CSF leak.  Pt with significant PMHx of HTN, and breast CA with bil mastectomy.    PT Comments    Pt scheduled for d/c today.  D/C was held yesterday due to hypoxia. O2 sats in upper 80s and low 90s during ambulation on RA.  Follow Up Recommendations  No PT follow up     Equipment Recommendations  Rolling walker with 5" wheels    Recommendations for Other Services       Precautions / Restrictions Precautions Precautions: Back Precaution Comments: Reviewed 3/3 back precautions. Required Braces or Orthoses: Spinal Brace Spinal Brace: Lumbar corset;Applied in sitting position Restrictions Weight Bearing Restrictions: No    Mobility  Bed Mobility               General bed mobility comments: Pt sitting in recliner upon arrival.  Transfers   Equipment used: Rolling walker (2 wheeled)   Sit to Stand: Supervision         General transfer comment: supervision for safety.   Ambulation/Gait Ambulation/Gait assistance: Supervision Ambulation Distance (Feet): 200 Feet Assistive device: Rolling walker (2 wheeled) Gait Pattern/deviations: Step-through pattern;Decreased stride length Gait velocity: decreased   General Gait Details: Verbal cues for upright posture.  Pt using RW safely.  O2 sats between 85 and 93% during ambulation. Pt asymptomatic.   Stairs            Wheelchair Mobility    Modified Rankin (Stroke Patients Only)       Balance                                    Cognition Arousal/Alertness: Awake/alert Behavior During Therapy: WFL for tasks assessed/performed Overall Cognitive Status: Within Functional Limits for tasks assessed                       Exercises      General Comments        Pertinent Vitals/Pain Pain Assessment: No/denies pain    Home Living                      Prior Function            PT Goals (current goals can now be found in the care plan section) Progress towards PT goals: Progressing toward goals    Frequency  Min 5X/week    PT Plan Current plan remains appropriate    Co-evaluation             End of Session Equipment Utilized During Treatment: Back brace;Gait belt Activity Tolerance: Patient tolerated treatment well Patient left: in chair;with family/visitor present;with call bell/phone within reach     Time: 1001-1026 PT Time Calculation (min) (ACUTE ONLY): 25 min  Charges:  $Gait Training: 23-37 mins                    G Codes:      [name] 09/28/2014, 11:04 AM

## 2014-09-28 NOTE — Discharge Summary (Signed)
Physician Discharge Summary   Patient ID: TYEASHA EBBS MRN: [id number] DOB/AGE: [birth year] 69 y.o.  Admit date: 09/25/2014 Discharge date: 09/28/2014  Admission Diagnoses:  Active Problems:   Spinal stenosis   Discharge Diagnoses:  Same   Surgeries: Procedure(s): DECOMPRESSION L3-L5 WITH INSERTION OF COFLEX   ( 2 LEVELS) on 09/25/2014   Consultants: PT  Discharged Condition: Stable  Hospital Course: ZYLIE MUMAW is an 69 y.o. female who was admitted 09/25/2014 with a chief complaint of low back pain and leg pain, and found to have a diagnosis of spinal stenosis.  They were brought to the operating room on 09/25/2014 and underwent the above named procedures.    The patient had an uncomplicated hospital course and was stable for discharge.  Recent vital signs:  Filed Vitals:   09/28/14 0607  BP: 108/52  Pulse: 82  Temp: 99.8 F (37.7 C)  Resp: 16    Recent laboratory studies:  Results for orders placed or performed during the hospital encounter of 09/24/14  Surgical pcr screen  Result Value Ref Range   MRSA, PCR NEGATIVE NEGATIVE   Staphylococcus aureus NEGATIVE NEGATIVE  Basic metabolic panel  Result Value Ref Range   Sodium 141 135 - 145 mmol/L   Potassium 3.1 (L) 3.5 - 5.1 mmol/L   Chloride 100 96 - 112 mmol/L   CO2 32 19 - 32 mmol/L   Glucose, Bld 106 (H) 70 - 99 mg/dL   BUN 14 6 - 23 mg/dL   Creatinine, Ser 0.80 0.50 - 1.10 mg/dL   Calcium 9.6 8.4 - 10.5 mg/dL   GFR calc non Af Amer 74 (L) >90 mL/min   GFR calc Af Amer 86 (L) >90 mL/min   Anion gap 9 5 - 15  CBC  Result Value Ref Range   WBC 10.2 4.0 - 10.5 K/uL   RBC 4.15 3.87 - 5.11 MIL/uL   Hemoglobin 12.9 12.0 - 15.0 g/dL   HCT 39.1 36.0 - 46.0 %   MCV 94.2 78.0 - 100.0 fL   MCH 31.1 26.0 - 34.0 pg   MCHC 33.0 30.0 - 36.0 g/dL   RDW 13.7 11.5 - 15.5 %   Platelets 268 150 - 400 K/uL  No blood products  Result Value Ref Range   Transfuse no blood products      TRANSFUSE NO BLOOD PRODUCTS,  VERIFIED BY Starleen Arms, RN    Discharge Medications:     Medication List    STOP taking these medications        HYDROcodone-acetaminophen 5-325 MG per tablet  Commonly known as:  NORCO/VICODIN  Replaced by:  HYDROcodone-acetaminophen 10-325 MG per tablet      TAKE these medications        ALPRAZolam 1 MG tablet  Commonly known as:  XANAX  Take 1 mg by mouth 3 (three) times daily as needed for anxiety.     amLODipine 10 MG tablet  Commonly known as:  NORVASC  Take 10 mg by mouth daily.     HYDROcodone-acetaminophen 10-325 MG per tablet  Commonly known as:  NORCO  Take 1-2 tablets by mouth every 4 (four) hours as needed for severe pain.     ibuprofen 200 MG tablet  Commonly known as:  ADVIL,MOTRIN  Take 200 mg by mouth every 6 (six) hours as needed for mild pain or moderate pain.     levothyroxine 100 MCG tablet  Commonly known as:  SYNTHROID, LEVOTHROID  Take 100 mcg by mouth daily.  lisinopril-hydrochlorothiazide 20-12.5 MG per tablet  Commonly known as:  PRINZIDE,ZESTORETIC  Take 1 tablet by mouth 2 (two) times daily.     methocarbamol 500 MG tablet  Commonly known as:  ROBAXIN  Take 1 tablet (500 mg total) by mouth 3 (three) times daily as needed for muscle spasms.     ondansetron 4 MG tablet  Commonly known as:  ZOFRAN  Take 1 tablet (4 mg total) by mouth every 8 (eight) hours as needed for nausea or vomiting.        Diagnostic Studies: Dg Lumbar Spine 2-3 Views  09/25/2014   CLINICAL DATA:  L3-5 decompression with coflex LMP verify prior to exam.  EXAM: DG C-ARM 61-120 MIN; LUMBAR SPINE - 2-3 VIEW  FLUOROSCOPY TIME:  Radiation Exposure Index (as provided by the fluoroscopic device):  If the device does not provide the exposure index:  Fluoroscopy Time (in minutes and seconds):  0 minutes 24 seconds  Number of Acquired Images:  2  COMPARISON:  None.  FINDINGS: Examination demonstrates mild spondylosis of the lumbar spine. There is a grade 1  anterolisthesis of L4 on L5. There is moderate disc space narrowing at the L5-S1 level. There is been L4 and L5 laminectomy with placement of a metallic device over the posterior soft tissues at the laminectomy site of the L4-5 level.  IMPRESSION: L4-5 laminectomy with placement of metallic hardware at the laminectomy site.  Mild spondylosis with subtle grade 1 anterolisthesis of L4 on L5. Disc disease at the L5-S1 level.   Electronically Signed   By: Marin Olp M.D.   On: 09/25/2014 17:12   Dg Chest Port 1 View  09/27/2014   CLINICAL DATA:  Pt had L4-5 surgery yesterday and is having no complaints chest or otherwise per pt. Hypoxemia per chart but no difficulty breathing per pt  EXAM: PORTABLE CHEST - 1 VIEW  COMPARISON:  01/19/2012  FINDINGS: Cardiac silhouette normal in size and configuration. No mediastinal or hilar masses or evidence of adenopathy.  Mild linear scarring at the left lung base, stable. Lungs otherwise clear. No pleural effusion or pneumothorax.  Bony thorax is grossly intact.  IMPRESSION: No active disease.   Electronically Signed   By: Lajean Manes M.D.   On: 09/27/2014 14:16   Dg C-arm 1-60 Min  09/25/2014   CLINICAL DATA:  L3-5 decompression with coflex LMP verify prior to exam.  EXAM: DG C-ARM 61-120 MIN; LUMBAR SPINE - 2-3 VIEW  FLUOROSCOPY TIME:  Radiation Exposure Index (as provided by the fluoroscopic device):  If the device does not provide the exposure index:  Fluoroscopy Time (in minutes and seconds):  0 minutes 24 seconds  Number of Acquired Images:  2  COMPARISON:  None.  FINDINGS: Examination demonstrates mild spondylosis of the lumbar spine. There is a grade 1 anterolisthesis of L4 on L5. There is moderate disc space narrowing at the L5-S1 level. There is been L4 and L5 laminectomy with placement of a metallic device over the posterior soft tissues at the laminectomy site of the L4-5 level.  IMPRESSION: L4-5 laminectomy with placement of metallic hardware at the  laminectomy site.  Mild spondylosis with subtle grade 1 anterolisthesis of L4 on L5. Disc disease at the L5-S1 level.   Electronically Signed   By: Marin Olp M.D.   On: 09/25/2014 17:12    Disposition: 01-Home or Self Care        Follow-up Information    Follow up with Dahlia Bailiff, MD. Schedule an  appointment as soon as possible for a visit in 2 weeks.   Specialty:  Orthopedic Surgery   Why:  For suture removal, For wound re-check   Contact information:   [address] [place] [address] [phone]        Signed: Augustin Schooling 09/28/2014, 8:34 AM

## 2014-09-29 ENCOUNTER — Encounter (HOSPITAL_COMMUNITY): Payer: Self-pay | Admitting: Orthopedic Surgery

## 2014-09-30 NOTE — Addendum Note (Signed)
Addendum  created 09/30/14 0010 by Roberts Gaudy, MD   Modules edited: Anesthesia Attestations

## 2014-11-07 ENCOUNTER — Encounter (HOSPITAL_COMMUNITY): Payer: Self-pay | Admitting: Anesthesiology

## 2014-11-07 ENCOUNTER — Encounter (HOSPITAL_COMMUNITY)
Admission: AD | Disposition: A | Discharge status: Home / Self Care | Payer: Self-pay | Source: Ambulatory Visit | Attending: Orthopedic Surgery

## 2014-11-07 ENCOUNTER — Observation Stay (HOSPITAL_COMMUNITY)
Admission: AD | Admit: 2014-11-07 | Discharge: 2014-11-08 | Disposition: A | Discharge status: Home / Self Care | Payer: Medicare Other | Source: Ambulatory Visit | Attending: Orthopedic Surgery | Admitting: Orthopedic Surgery

## 2014-11-07 ENCOUNTER — Inpatient Hospital Stay (HOSPITAL_COMMUNITY): Payer: Medicare Other | Admitting: Anesthesiology

## 2014-11-07 DIAGNOSIS — Y838 Other surgical procedures as the cause of abnormal reaction of the patient, or of later complication, without mention of misadventure at the time of the procedure: Secondary | ICD-10-CM | POA: Insufficient documentation

## 2014-11-07 DIAGNOSIS — E039 Hypothyroidism, unspecified: Secondary | ICD-10-CM | POA: Diagnosis not present

## 2014-11-07 DIAGNOSIS — G4730 Sleep apnea, unspecified: Secondary | ICD-10-CM | POA: Insufficient documentation

## 2014-11-07 DIAGNOSIS — K219 Gastro-esophageal reflux disease without esophagitis: Secondary | ICD-10-CM | POA: Insufficient documentation

## 2014-11-07 DIAGNOSIS — M179 Osteoarthritis of knee, unspecified: Secondary | ICD-10-CM | POA: Diagnosis not present

## 2014-11-07 DIAGNOSIS — T8149XA Infection following a procedure, other surgical site, initial encounter: Secondary | ICD-10-CM | POA: Diagnosis present

## 2014-11-07 DIAGNOSIS — Z90710 Acquired absence of both cervix and uterus: Secondary | ICD-10-CM | POA: Diagnosis not present

## 2014-11-07 DIAGNOSIS — T814XXA Infection following a procedure, initial encounter: Principal | ICD-10-CM | POA: Insufficient documentation

## 2014-11-07 DIAGNOSIS — S31000A Unspecified open wound of lower back and pelvis without penetration into retroperitoneum, initial encounter: Secondary | ICD-10-CM | POA: Diagnosis not present

## 2014-11-07 DIAGNOSIS — Z79899 Other long term (current) drug therapy: Secondary | ICD-10-CM | POA: Insufficient documentation

## 2014-11-07 DIAGNOSIS — I10 Essential (primary) hypertension: Secondary | ICD-10-CM | POA: Insufficient documentation

## 2014-11-07 DIAGNOSIS — M1711 Unilateral primary osteoarthritis, right knee: Secondary | ICD-10-CM | POA: Diagnosis not present

## 2014-11-07 DIAGNOSIS — Z880 Allergy status to penicillin: Secondary | ICD-10-CM | POA: Insufficient documentation

## 2014-11-07 DIAGNOSIS — Z886 Allergy status to analgesic agent status: Secondary | ICD-10-CM | POA: Insufficient documentation

## 2014-11-07 DIAGNOSIS — Z87891 Personal history of nicotine dependence: Secondary | ICD-10-CM | POA: Insufficient documentation

## 2014-11-07 DIAGNOSIS — Z4789 Encounter for other orthopedic aftercare: Secondary | ICD-10-CM | POA: Diagnosis not present

## 2014-11-07 HISTORY — PX: INJECTION KNEE: SHX2446

## 2014-11-07 HISTORY — PX: LUMBAR LAMINECTOMY/DECOMPRESSION MICRODISCECTOMY: SHX5026

## 2014-11-07 LAB — SURGICAL PCR SCREEN
MRSA, PCR: NEGATIVE
Staphylococcus aureus: NEGATIVE

## 2014-11-07 LAB — GRAM STAIN

## 2014-11-07 SURGERY — LUMBAR LAMINECTOMY/DECOMPRESSION MICRODISCECTOMY
Anesthesia: General | Site: Knee | Laterality: Right

## 2014-11-07 MED ORDER — 0.9 % SODIUM CHLORIDE (POUR BTL) OPTIME
TOPICAL | Status: DC | PRN
  Administered 2014-11-07: 1000 mL

## 2014-11-07 MED ORDER — ACETAMINOPHEN 10 MG/ML IV SOLN
INTRAVENOUS | Status: AC
  Filled 2014-11-07: qty 100

## 2014-11-07 MED ORDER — NEOSTIGMINE METHYLSULFATE 10 MG/10ML IV SOLN
INTRAVENOUS | Status: AC
  Filled 2014-11-07: qty 1

## 2014-11-07 MED ORDER — ARTIFICIAL TEARS OP OINT
TOPICAL_OINTMENT | OPHTHALMIC | Status: DC | PRN
  Administered 2014-11-07: 1 via OPHTHALMIC

## 2014-11-07 MED ORDER — CEFAZOLIN SODIUM 1-5 GM-% IV SOLN
1.0000 g | Freq: Three times a day (TID) | INTRAVENOUS | Status: AC
  Administered 2014-11-08 (×2): 1 g via INTRAVENOUS
  Filled 2014-11-07 (×2): qty 50

## 2014-11-07 MED ORDER — METHOCARBAMOL 1000 MG/10ML IJ SOLN: 500.0000 mg | Freq: Four times a day (QID) | INTRAVENOUS | Status: DC | PRN

## 2014-11-07 MED ORDER — HYDROCODONE-ACETAMINOPHEN 10-325 MG PO TABS
1.0000 | ORAL_TABLET | ORAL | Status: DC | PRN
  Administered 2014-11-07 – 2014-11-08 (×3): 2 via ORAL
  Filled 2014-11-07 (×3): qty 2

## 2014-11-07 MED ORDER — BUPIVACAINE HCL (PF) 0.25 % IJ SOLN
INTRAMUSCULAR | Status: AC
  Filled 2014-11-07: qty 30

## 2014-11-07 MED ORDER — ONDANSETRON HCL 4 MG/2ML IJ SOLN
4.0000 mg | INTRAMUSCULAR | Status: DC | PRN
Start: 2014-11-07 — End: 2014-11-08
  Administered 2014-11-07 – 2014-11-08 (×2): 4 mg via INTRAVENOUS
  Filled 2014-11-07 (×2): qty 2

## 2014-11-07 MED ORDER — AMLODIPINE BESYLATE 10 MG PO TABS
10.0000 mg | ORAL_TABLET | Freq: Every day | ORAL | Status: DC
  Administered 2014-11-08: 10 mg via ORAL
  Filled 2014-11-07: qty 1

## 2014-11-07 MED ORDER — ONDANSETRON HCL 4 MG/2ML IJ SOLN
INTRAMUSCULAR | Status: DC | PRN
  Administered 2014-11-07: 4 mg via INTRAVENOUS

## 2014-11-07 MED ORDER — HYDROCHLOROTHIAZIDE 12.5 MG PO CAPS
12.5000 mg | ORAL_CAPSULE | Freq: Two times a day (BID) | ORAL | Status: DC
  Administered 2014-11-07 – 2014-11-08 (×2): 12.5 mg via ORAL
  Filled 2014-11-07 (×2): qty 1

## 2014-11-07 MED ORDER — LIDOCAINE HCL (CARDIAC) 20 MG/ML IV SOLN
INTRAVENOUS | Status: DC | PRN
  Administered 2014-11-07: 40 mg via INTRAVENOUS
  Administered 2014-11-07: 60 mg via INTRAVENOUS

## 2014-11-07 MED ORDER — METHOCARBAMOL 500 MG PO TABS: 500.0000 mg | ORAL_TABLET | Freq: Four times a day (QID) | ORAL | Status: DC | PRN

## 2014-11-07 MED ORDER — SODIUM CHLORIDE 0.9 % IJ SOLN: 3.0000 mL | Freq: Two times a day (BID) | INTRAMUSCULAR | Status: DC

## 2014-11-07 MED ORDER — MENTHOL 3 MG MT LOZG: 1.0000 | LOZENGE | OROMUCOSAL | Status: DC | PRN

## 2014-11-07 MED ORDER — VANCOMYCIN HCL 500 MG IV SOLR
INTRAVENOUS | Status: AC
  Filled 2014-11-07: qty 500

## 2014-11-07 MED ORDER — ROCURONIUM BROMIDE 100 MG/10ML IV SOLN
INTRAVENOUS | Status: DC | PRN
  Administered 2014-11-07: 30 mg via INTRAVENOUS

## 2014-11-07 MED ORDER — BUPIVACAINE HCL (PF) 0.25 % IJ SOLN
INTRAMUSCULAR | Status: DC | PRN
  Administered 2014-11-07: 10 mL
  Administered 2014-11-07: 2 mL

## 2014-11-07 MED ORDER — METHYLPREDNISOLONE ACETATE 40 MG/ML IJ SUSP
INTRAMUSCULAR | Status: DC | PRN
  Administered 2014-11-07: 40 mg

## 2014-11-07 MED ORDER — ACETAMINOPHEN 10 MG/ML IV SOLN
1000.0000 mg | Freq: Four times a day (QID) | INTRAVENOUS | Status: DC
  Administered 2014-11-07 – 2014-11-08 (×2): 1000 mg via INTRAVENOUS
  Filled 2014-11-07: qty 100

## 2014-11-07 MED ORDER — VANCOMYCIN HCL 500 MG IV SOLR
INTRAVENOUS | Status: DC | PRN
  Administered 2014-11-07: 500 mg

## 2014-11-07 MED ORDER — GLYCOPYRROLATE 0.2 MG/ML IJ SOLN
INTRAMUSCULAR | Status: AC
  Filled 2014-11-07: qty 4

## 2014-11-07 MED ORDER — PHENOL 1.4 % MT LIQD: 1.0000 | OROMUCOSAL | Status: DC | PRN

## 2014-11-07 MED ORDER — CEFAZOLIN SODIUM-DEXTROSE 2-3 GM-% IV SOLR
INTRAVENOUS | Status: AC
  Filled 2014-11-07: qty 50

## 2014-11-07 MED ORDER — MIDAZOLAM HCL 5 MG/5ML IJ SOLN
INTRAMUSCULAR | Status: DC | PRN
  Administered 2014-11-07: 2 mg via INTRAVENOUS

## 2014-11-07 MED ORDER — SODIUM CHLORIDE 0.9 % IJ SOLN: 3.0000 mL | INTRAMUSCULAR | Status: DC | PRN

## 2014-11-07 MED ORDER — FENTANYL CITRATE (PF) 250 MCG/5ML IJ SOLN
INTRAMUSCULAR | Status: AC
  Filled 2014-11-07: qty 5

## 2014-11-07 MED ORDER — METHYLPREDNISOLONE ACETATE 40 MG/ML IJ SUSP
INTRAMUSCULAR | Status: AC
  Filled 2014-11-07: qty 1

## 2014-11-07 MED ORDER — DEXAMETHASONE SODIUM PHOSPHATE 4 MG/ML IJ SOLN
INTRAMUSCULAR | Status: AC
  Filled 2014-11-07: qty 2

## 2014-11-07 MED ORDER — MIDAZOLAM HCL 2 MG/2ML IJ SOLN
INTRAMUSCULAR | Status: AC
  Filled 2014-11-07: qty 2

## 2014-11-07 MED ORDER — GLYCOPYRROLATE 0.2 MG/ML IJ SOLN
INTRAMUSCULAR | Status: DC | PRN
  Administered 2014-11-07: 0.3 mg via INTRAVENOUS

## 2014-11-07 MED ORDER — SODIUM CHLORIDE 0.9 % IR SOLN
Status: DC | PRN
  Administered 2014-11-07 (×2): 3000 mL

## 2014-11-07 MED ORDER — LISINOPRIL 20 MG PO TABS
20.0000 mg | ORAL_TABLET | Freq: Two times a day (BID) | ORAL | Status: DC
  Administered 2014-11-07 – 2014-11-08 (×2): 20 mg via ORAL
  Filled 2014-11-07 (×2): qty 1

## 2014-11-07 MED ORDER — ALPRAZOLAM 0.5 MG PO TABS: 1.0000 mg | ORAL_TABLET | Freq: Three times a day (TID) | ORAL | Status: DC | PRN

## 2014-11-07 MED ORDER — FENTANYL CITRATE (PF) 100 MCG/2ML IJ SOLN
25.0000 ug | INTRAMUSCULAR | Status: DC | PRN
  Administered 2014-11-07 (×2): 25 ug via INTRAVENOUS

## 2014-11-07 MED ORDER — METHOCARBAMOL 500 MG PO TABS: 500.0000 mg | ORAL_TABLET | Freq: Three times a day (TID) | ORAL | Status: DC | PRN

## 2014-11-07 MED ORDER — SODIUM CHLORIDE 0.9 % IV SOLN: 250.0000 mL | INTRAVENOUS | Status: DC

## 2014-11-07 MED ORDER — HYDROCODONE-ACETAMINOPHEN 10-325 MG PO TABS: 1.0000 | ORAL_TABLET | Freq: Four times a day (QID) | ORAL | Status: DC | PRN

## 2014-11-07 MED ORDER — DEXAMETHASONE SODIUM PHOSPHATE 4 MG/ML IJ SOLN
INTRAMUSCULAR | Status: DC | PRN
  Administered 2014-11-07: 8 mg via INTRAVENOUS

## 2014-11-07 MED ORDER — ONDANSETRON HCL 4 MG PO TABS: 4.0000 mg | ORAL_TABLET | Freq: Three times a day (TID) | ORAL | Status: DC | PRN

## 2014-11-07 MED ORDER — LIDOCAINE HCL (PF) 1 % IJ SOLN
INTRAMUSCULAR | Status: AC
  Filled 2014-11-07: qty 30

## 2014-11-07 MED ORDER — PROPOFOL 10 MG/ML IV BOLUS
INTRAVENOUS | Status: DC | PRN
  Administered 2014-11-07: 100 mg via INTRAVENOUS

## 2014-11-07 MED ORDER — NEOSTIGMINE METHYLSULFATE 10 MG/10ML IV SOLN
INTRAVENOUS | Status: DC | PRN
  Administered 2014-11-07: 2.5 mg via INTRAVENOUS

## 2014-11-07 MED ORDER — CEPHALEXIN 500 MG PO CAPS: 500.0000 mg | ORAL_CAPSULE | Freq: Four times a day (QID) | ORAL | Status: DC

## 2014-11-07 MED ORDER — FENTANYL CITRATE (PF) 100 MCG/2ML IJ SOLN
INTRAMUSCULAR | Status: AC
  Filled 2014-11-07: qty 2

## 2014-11-07 MED ORDER — FENTANYL CITRATE (PF) 100 MCG/2ML IJ SOLN
INTRAMUSCULAR | Status: DC | PRN
  Administered 2014-11-07: 50 ug via INTRAVENOUS
  Administered 2014-11-07: 100 ug via INTRAVENOUS
  Administered 2014-11-07 (×2): 50 ug via INTRAVENOUS

## 2014-11-07 MED ORDER — LIDOCAINE HCL (PF) 1 % IJ SOLN
INTRAMUSCULAR | Status: DC | PRN
  Administered 2014-11-07: 2 mL

## 2014-11-07 MED ORDER — LISINOPRIL-HYDROCHLOROTHIAZIDE 20-12.5 MG PO TABS: 1.0000 | ORAL_TABLET | Freq: Two times a day (BID) | ORAL | Status: DC

## 2014-11-07 MED ORDER — LACTATED RINGERS IV SOLN
INTRAVENOUS | Status: DC
  Administered 2014-11-07 (×2): via INTRAVENOUS

## 2014-11-07 MED ORDER — LACTATED RINGERS IV SOLN: INTRAVENOUS | Status: DC

## 2014-11-07 MED ORDER — LEVOTHYROXINE SODIUM 100 MCG PO TABS
100.0000 ug | ORAL_TABLET | Freq: Every day | ORAL | Status: DC
  Administered 2014-11-08: 100 ug via ORAL
  Filled 2014-11-07: qty 1

## 2014-11-07 MED ORDER — CEFAZOLIN SODIUM-DEXTROSE 2-3 GM-% IV SOLR
INTRAVENOUS | Status: DC | PRN
  Administered 2014-11-07: 2 g via INTRAVENOUS

## 2014-11-07 SURGICAL SUPPLY — 67 items
BUR EGG ELITE 4.0 (BURR) IMPLANT
BUR MATCHSTICK NEURO 3.0 LAGG (BURR) IMPLANT
CANISTER SUCTION 2500CC (MISCELLANEOUS) ×3 IMPLANT
CLSR STERI-STRIP ANTIMIC 1/2X4 (GAUZE/BANDAGES/DRESSINGS) IMPLANT
CORDS BIPOLAR (ELECTRODE) IMPLANT
COVER SURGICAL LIGHT HANDLE (MISCELLANEOUS) ×3 IMPLANT
DRAIN CHANNEL 15F RND FF W/TCR (WOUND CARE) ×3 IMPLANT
DRAPE POUCH INSTRU U-SHP 10X18 (DRAPES) IMPLANT
DRAPE SURG 17X23 STRL (DRAPES) ×6 IMPLANT
DRAPE U-SHAPE 47X51 STRL (DRAPES) ×3 IMPLANT
DRSG ADAPTIC 3X8 NADH LF (GAUZE/BANDAGES/DRESSINGS) ×3 IMPLANT
DRSG MEPILEX BORDER 4X8 (GAUZE/BANDAGES/DRESSINGS) IMPLANT
DURAPREP 26ML APPLICATOR (WOUND CARE) IMPLANT
ELECT BLADE 4.0 EZ CLEAN MEGAD (MISCELLANEOUS)
ELECT CAUTERY BLADE 6.4 (BLADE) ×3 IMPLANT
ELECT PENCIL ROCKER SW 15FT (MISCELLANEOUS) ×3 IMPLANT
ELECT REM PT RETURN 9FT ADLT (ELECTROSURGICAL) ×3
ELECTRODE BLDE 4.0 EZ CLN MEGD (MISCELLANEOUS) IMPLANT
ELECTRODE REM PT RTRN 9FT ADLT (ELECTROSURGICAL) ×2 IMPLANT
EVACUATOR 1/8 PVC DRAIN (DRAIN) IMPLANT
EVACUATOR SILICONE 100CC (DRAIN) IMPLANT
GLOVE BIOGEL PI IND STRL 8 (GLOVE) ×2 IMPLANT
GLOVE BIOGEL PI IND STRL 8.5 (GLOVE) ×2 IMPLANT
GLOVE BIOGEL PI INDICATOR 8 (GLOVE) ×1
GLOVE BIOGEL PI INDICATOR 8.5 (GLOVE) ×1
GLOVE ORTHO TXT STRL SZ7.5 (GLOVE) ×3 IMPLANT
GLOVE SS BIOGEL STRL SZ 8.5 (GLOVE) ×2 IMPLANT
GLOVE SUPERSENSE BIOGEL SZ 8.5 (GLOVE) ×1
GOWN STRL REUS W/ TWL XL LVL3 (GOWN DISPOSABLE) ×4 IMPLANT
GOWN STRL REUS W/TWL 2XL LVL3 (GOWN DISPOSABLE) ×6 IMPLANT
GOWN STRL REUS W/TWL XL LVL3 (GOWN DISPOSABLE) ×2
HANDPIECE INTERPULSE COAX TIP (DISPOSABLE) ×1
KIT BASIN OR (CUSTOM PROCEDURE TRAY) ×3 IMPLANT
KIT ROOM TURNOVER OR (KITS) ×3 IMPLANT
KIT STIMULAN RAPID CURE 5CC (Orthopedic Implant) ×3 IMPLANT
NEEDLE 22X1 1/2 (OR ONLY) (NEEDLE) ×3 IMPLANT
NEEDLE SPNL 18GX3.5 QUINCKE PK (NEEDLE) IMPLANT
NS IRRIG 1000ML POUR BTL (IV SOLUTION) ×3 IMPLANT
PACK LAMINECTOMY ORTHO (CUSTOM PROCEDURE TRAY) ×3 IMPLANT
PACK UNIVERSAL I (CUSTOM PROCEDURE TRAY) ×3 IMPLANT
PAD ARMBOARD 7.5X6 YLW CONV (MISCELLANEOUS) ×6 IMPLANT
PATTIES SURGICAL .5 X.5 (GAUZE/BANDAGES/DRESSINGS) IMPLANT
PATTIES SURGICAL .5 X1 (DISPOSABLE) IMPLANT
SET HNDPC FAN SPRY TIP SCT (DISPOSABLE) ×2 IMPLANT
SPONGE GAUZE 4X4 12PLY STER LF (GAUZE/BANDAGES/DRESSINGS) ×3 IMPLANT
SPONGE SURGIFOAM ABS GEL 100 (HEMOSTASIS) IMPLANT
SURGIFLO TRUKIT (HEMOSTASIS) IMPLANT
SUT BONE WAX W31G (SUTURE) IMPLANT
SUT MON AB 3-0 SH 27 (SUTURE)
SUT MON AB 3-0 SH27 (SUTURE) IMPLANT
SUT PROLENE 1 CT (SUTURE) ×6 IMPLANT
SUT VIC AB 0 CT1 27 (SUTURE)
SUT VIC AB 0 CT1 27XBRD ANBCTR (SUTURE) IMPLANT
SUT VIC AB 1 CT1 27 (SUTURE)
SUT VIC AB 1 CT1 27XBRD ANBCTR (SUTURE) IMPLANT
SUT VIC AB 1 CTX 36 (SUTURE)
SUT VIC AB 1 CTX36XBRD ANBCTR (SUTURE) IMPLANT
SUT VIC AB 2-0 CT1 18 (SUTURE) IMPLANT
SWAB CULTURE LIQUID MINI MALE (MISCELLANEOUS) ×3 IMPLANT
SYR BULB IRRIGATION 50ML (SYRINGE) IMPLANT
SYR CONTROL 10ML LL (SYRINGE) ×9 IMPLANT
TAPE CLOTH SURG 4X10 WHT LF (GAUZE/BANDAGES/DRESSINGS) ×3 IMPLANT
TOWEL OR 17X24 6PK STRL BLUE (TOWEL DISPOSABLE) ×6 IMPLANT
TOWEL OR 17X26 10 PK STRL BLUE (TOWEL DISPOSABLE) ×3 IMPLANT
TUBE ANAEROBIC SPECIMEN COL (MISCELLANEOUS) ×3 IMPLANT
WATER STERILE IRR 1000ML POUR (IV SOLUTION) IMPLANT
YANKAUER SUCT BULB TIP NO VENT (SUCTIONS) ×3 IMPLANT

## 2014-11-07 NOTE — Transfer of Care (Signed)
Immediate Anesthesia Transfer of Care Note  Patient: Karen [name]  Procedure(s) Performed: Procedure(s): IRRIGATION AND DEBRIDEMENT BACK WOUND, PLACEMENT OF ANTIBIOTIC BEADS (N/A) KNEE INJECTION (Right)  Patient Location: PACU  Anesthesia Type:General  Level of Consciousness: awake, alert , oriented, patient cooperative and responds to stimulation  Airway & Oxygen Therapy: Patient Spontanous Breathing and Patient connected to nasal cannula oxygen  Post-op Assessment: Report given to RN, Post -op Vital signs reviewed and stable, Patient moving all extremities and Patient moving all extremities X 4  Post vital signs: Reviewed and stable  Last Vitals:  Filed Vitals:   11/07/14 1615  BP: 141/82  Pulse: 84  Temp: 37.2 C  Resp: 18    Complications: No apparent anesthesia complications

## 2014-11-07 NOTE — Anesthesia Procedure Notes (Signed)
Procedure Name: Intubation Date/Time: 11/07/2014 7:12 PM Performed by: Jacquiline Doe A Pre-anesthesia Checklist: Patient identified, Timeout performed, Emergency Drugs available, Suction available and Patient being monitored Patient Re-evaluated:Patient Re-evaluated prior to inductionOxygen Delivery Method: Circle system utilized Preoxygenation: Pre-oxygenation with 100% oxygen Intubation Type: IV induction and Cricoid Pressure applied Ventilation: Mask ventilation without difficulty and Oral airway inserted - appropriate to patient size Laryngoscope Size: Mac and 4 Grade View: Grade I Tube type: Oral Tube size: 7.5 mm Number of attempts: 1 Airway Equipment and Method: Stylet Placement Confirmation: ETT inserted through vocal cords under direct vision,  breath sounds checked- equal and bilateral and positive ETCO2 Secured at: 22 cm Tube secured with: Tape Dental Injury: Teeth and Oropharynx as per pre-operative assessment

## 2014-11-07 NOTE — Brief Op Note (Signed)
11/07/2014  8:10 PM  PATIENT:  Karen [name]  69 y.o. female  PRE-OPERATIVE DIAGNOSIS:  Back wound infection,  Right knee arthritis  POST-OPERATIVE DIAGNOSIS:  Back wound infection , Right knee arthritis  PROCEDURE:  Procedure(s): IRRIGATION AND DEBRIDEMENT BACK WOUND (N/A) KNEE INJECTION (Right)  SURGEON:  Surgeon(s) and Role:    * Melina Schools, MD - Primary  PHYSICIAN ASSISTANT:   ASSISTANTS: none   ANESTHESIA:   general  EBL:  Total I/O In: 1000 [I.V.:1000] Out: -   BLOOD ADMINISTERED:none  DRAINS: none   LOCAL MEDICATIONS USED:  MARCAINE     SPECIMEN:  CX taken from posterior spine wound    DISPOSITION OF SPECIMEN:  Micro  COUNTS:  YES  TOURNIQUET:  * No tourniquets in log *  DICTATION: .Other Dictation: Dictation Number [id number]  PLAN OF CARE: Admit to inpatient   PATIENT DISPOSITION:  PACU - hemodynamically stable.

## 2014-11-07 NOTE — H&P (Signed)
History of Present Illness  The patient is a 69 year old female presenting for a post-operative visit. Patient is 6 weeks postop following lumbar Decompression L3-4, 4-5 with Insertion of Coflex device on 09/25/14.Overall the patient feels that they are better. Post operative pain has been mild. The patient does report tenderness (at the incision), wound dehiscence and drainage ("bright red"), while the patient does not report pain, fever, chills, redness, numbness, tingling, burning or pain in the calf The patient does indicate that these symptoms are improving ("that bad pain is gone"). Pain medications include: Hydrocodone ("I usually take at night but rarely during the day") . The patient feels they are having no difficulty tolerating their pain medication(s). There have been no post operative complications. The patient is not taking any prophylaxis for DVT . The patient is currently with the assistance of: cane. The patient is using a brace.  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 (rx gven at visit per ddb/smt 10/10/14) Hydrocodone-Acetaminophen (10-325MG  Tablet, Oral) Active. Methocarbamol (500MG  Tablet, Oral) Active. Ondansetron HCl (4MG  Tablet, Oral) Active. (prn) Lisinopril (Oral) Specific dose unknown - Active. Lisinopril-Hydrochlorothiazide (20-12.5MG  Tablet, Oral) Active. (qd) AmLODIPine Besylate (10MG   Tablet, Oral) Active. (bid) Hydrocodone-Acetaminophen (5-300MG  Tablet, Oral) Active. (prn Rx'd by PCP) Levothyroxine Sodium (100MCG Tablet, Oral) Active. (qd) Ibuprofen (200MG  Tablet, Oral) Active. ALPRAZolam (1MG  Tablet, Oral) Active. (tid) Medications Reconciled  Vitals  11/07/2014 2:34 PM Temp.: 99.7F   A+O X3 NVI Compartments soft/NT No SOB/CP Abd soft/NT  No history of B/B incontinence Right knee pain with palpation and ROM Back: eschar formation over posterior spine wound.  No active drainage.  Positive foul odor and dehisence  Plan:  Patient 6 weeks form lumbar decompression and insertion of coflex.  Has been doing well but noted over last 3 weeks that wound has been deteriorating.  IN office today I noted evidence of superficial infection and wound breakdown. Recommend aggressive management to prevent worsening of infection that could require removal of implants. Samuel Bouche of lumbar spine satisfactory Plan on formal I&D in the OR 24 hrs of iv abx then 10 day course of oral abx Discussed risks/benefits - all questions addressed Risks: infection, bleeding, nerve damage, need for further surgery, death, stroke, paralysis

## 2014-11-07 NOTE — Anesthesia Preprocedure Evaluation (Addendum)
Anesthesia Evaluation  Patient identified by MRN, date of birth, ID band Patient awake    Reviewed: Allergy & Precautions, H&P , NPO status , Patient's Chart, lab work & pertinent test results  Airway Mallampati: II  TM Distance: >3 FB Neck ROM: Full    Dental no notable dental hx. (+) Teeth Intact, Dental Advisory Given   Pulmonary sleep apnea , former smoker,  breath sounds clear to auscultation  Pulmonary exam normal       Cardiovascular hypertension, Pt. on medications Rhythm:Regular Rate:Normal     Neuro/Psych negative neurological ROS  negative psych ROS   GI/Hepatic Neg liver ROS, GERD-  Controlled,  Endo/Other  negative endocrine ROSHypothyroidism   Renal/GU negative Renal ROS  negative genitourinary   Musculoskeletal   Abdominal   Peds  Hematology negative hematology ROS (+)   Anesthesia Other Findings   Reproductive/Obstetrics negative OB ROS                           Anesthesia Physical Anesthesia Plan  ASA: III  Anesthesia Plan: General   Post-op Pain Management:    Induction: Intravenous  Airway Management Planned: Oral ETT  Additional Equipment:   Intra-op Plan:   Post-operative Plan: Extubation in OR  Informed Consent: I have reviewed the patients History and Physical, chart, labs and discussed the procedure including the risks, benefits and alternatives for the proposed anesthesia with the patient or authorized representative who has indicated his/her understanding and acceptance.   Dental advisory given  Plan Discussed with: CRNA  Anesthesia Plan Comments:        Anesthesia Quick Evaluation

## 2014-11-07 NOTE — Progress Notes (Signed)
Patient arrived to 5N19 from PACU. Alert and oriented x 4. VSS. Will continue to monitor patient closely. Burnell Blanks, RN

## 2014-11-07 NOTE — Anesthesia Postprocedure Evaluation (Signed)
  Anesthesia Post-op Note  Patient: Karen [name]  Procedure(s) Performed: Procedure(s): IRRIGATION AND DEBRIDEMENT BACK WOUND, PLACEMENT OF ANTIBIOTIC BEADS (N/A) KNEE INJECTION (Right)  Patient Location: PACU  Anesthesia Type:General  Level of Consciousness: awake and alert   Airway and Oxygen Therapy: Patient Spontanous Breathing  Post-op Pain: moderate  Post-op Assessment: Post-op Vital signs reviewed, Patient's Cardiovascular Status Stable and Respiratory Function Stable  Post-op Vital Signs: Reviewed  Filed Vitals:   11/07/14 2114  BP: 139/72  Pulse: 81  Temp: 36.8 C  Resp: 21    Complications: No apparent anesthesia complications

## 2014-11-08 ENCOUNTER — Encounter (HOSPITAL_COMMUNITY): Payer: Self-pay | Admitting: Orthopedic Surgery

## 2014-11-08 DIAGNOSIS — E039 Hypothyroidism, unspecified: Secondary | ICD-10-CM | POA: Diagnosis not present

## 2014-11-08 DIAGNOSIS — Z880 Allergy status to penicillin: Secondary | ICD-10-CM | POA: Diagnosis not present

## 2014-11-08 DIAGNOSIS — G4730 Sleep apnea, unspecified: Secondary | ICD-10-CM | POA: Diagnosis not present

## 2014-11-08 DIAGNOSIS — K219 Gastro-esophageal reflux disease without esophagitis: Secondary | ICD-10-CM | POA: Diagnosis not present

## 2014-11-08 DIAGNOSIS — Z79899 Other long term (current) drug therapy: Secondary | ICD-10-CM | POA: Diagnosis not present

## 2014-11-08 DIAGNOSIS — T814XXA Infection following a procedure, initial encounter: Secondary | ICD-10-CM | POA: Diagnosis not present

## 2014-11-08 DIAGNOSIS — Z886 Allergy status to analgesic agent status: Secondary | ICD-10-CM | POA: Diagnosis not present

## 2014-11-08 DIAGNOSIS — Z87891 Personal history of nicotine dependence: Secondary | ICD-10-CM | POA: Diagnosis not present

## 2014-11-08 DIAGNOSIS — I10 Essential (primary) hypertension: Secondary | ICD-10-CM | POA: Diagnosis not present

## 2014-11-08 DIAGNOSIS — Z90710 Acquired absence of both cervix and uterus: Secondary | ICD-10-CM | POA: Diagnosis not present

## 2014-11-08 LAB — CBC
HEMATOCRIT: 32.6 % — AB (ref 36.0–46.0)
Hemoglobin: 10.8 g/dL — ABNORMAL LOW (ref 12.0–15.0)
MCH: 30.5 pg (ref 26.0–34.0)
MCHC: 33.1 g/dL (ref 30.0–36.0)
MCV: 92.1 fL (ref 78.0–100.0)
Platelets: 252 10*3/uL (ref 150–400)
RBC: 3.54 MIL/uL — ABNORMAL LOW (ref 3.87–5.11)
RDW: 13.8 % (ref 11.5–15.5)
WBC: 8.1 10*3/uL (ref 4.0–10.5)

## 2014-11-08 LAB — BASIC METABOLIC PANEL
Anion gap: 11 (ref 5–15)
BUN: 10 mg/dL (ref 6–20)
CALCIUM: 8.8 mg/dL — AB (ref 8.9–10.3)
CO2: 27 mmol/L (ref 22–32)
CREATININE: 0.84 mg/dL (ref 0.44–1.00)
Chloride: 100 mmol/L — ABNORMAL LOW (ref 101–111)
GFR calc Af Amer: >60 mL/min (ref >60)
GFR calc non Af Amer: >60 mL/min (ref >60)
Glucose, Bld: 167 mg/dL — ABNORMAL HIGH (ref 65–99)
POTASSIUM: 3.5 mmol/L (ref 3.5–5.1)
Sodium: 138 mmol/L (ref 135–145)

## 2014-11-08 MED ORDER — ACETAMINOPHEN 500 MG PO TABS
1000.0000 mg | ORAL_TABLET | Freq: Four times a day (QID) | ORAL | Status: DC
  Filled 2014-11-08: qty 2

## 2014-11-08 NOTE — Op Note (Signed)
NAMECURTISHA, BENDIX                 ACCOUNT NO.:  [id number]  MEDICAL RECORD NO.:  [id number]  LOCATION:  5N19C                        FACILITY:  [facility]  PHYSICIAN:  Dutch Ing D. Rolena Infante, M.D. DATE OF BIRTH:  [birth year]  DATE OF PROCEDURE:  11/07/2014 DATE OF DISCHARGE:  11/08/2014                              OPERATIVE REPORT   PREOPERATIVE DIAGNOSES:  Superficial wound infection, lumbar spine, and osteoarthritis of the right knee.  POSTOPERATIVE DIAGNOSES:  Superficial wound infection, lumbar spine, and osteoarthritis of the right knee.  OPERATIVE PROCEDURE:  Intra-articular cortisone injection right knee with Depo-Medrol, Marcaine, and lidocaine and I and D of posterior wound with primary closure and insertion of antibiotic beads.  COMPLICATIONS:  None.  CONDITION:  Stable.  HISTORY:  She is a very pleasant 69 year old woman, who is about 6 weeks out from a lumbar decompression with insertion of coflex device.  She returned today to my office 6 weeks postop with breakdown of the wound and eschar formation.  As a result, we elected to proceed with a formal I and D this evening to prevent worsening of the infection.  All appropriate risks, benefits, and alternatives to surgery were discussed.  OPERATIVE NOTE:  The patient was brought to the operating room, placed supine on the operating table.  After successful induction of general anesthesia and endotracheal intubation, time-out was taken confirming patient procedure and all other pertinent important data.  The right knee was cleaned with Betadine and a sterile intra-articular injection with 1 mL Depo-Medrol, 2 mL of 1% plain Marcaine were injected into the knee without issue.  Band-aid was applied to this and the patient was turned prone onto the Wilson frame.  All bony prominences were well padded and the back was prepped and draped.  Time-out was taken again to confirm the back procedure.  The wound was then re-incised and all  of the necrotic tissue was debrided sharply.  There was excellent bleeding tissue noted.  The wound was then taken down to the deep fascia and the deep fascia was sharply incised.  There was no purulent material deep to the fascia.  I then took cultures from the deep fascia and superficial and sent them for Gram stain, anaerobic and aerobic cultures and fungus. Antibiotics were then administered.  The wound was then copiously irrigated with Pulsavac lavage, 6000 mL.  I then placed the antibiotic impregnated beads (vancomycin) into the wound to help aid in wound healing and prevent further infection.  The wound was then closed with an interrupted vertical mattress sutures with #1 Prolene.  I then injected around the wound with 0.25% Marcaine for postoperative analgesia.  An Adaptic and bulky dry dressing were then applied.  The patient was then extubated, transferred to PACU without incident.  At the end of the case, all needle and sponge counts were correct.  The patient was transferred to the PACU without incident.     Shandiin Eisenbeis D. Rolena Infante, M.D.     DDB/MEDQ  D:  11/07/2014  T:  11/08/2014  Job:  [id number]  cc:   [place]

## 2014-11-08 NOTE — Progress Notes (Signed)
[name] discharged home per MD order. Discharge instructions reviewed and discussed with patient. All questions and concerns answered. Copy of instructions and scripts given to patient. IV removed.  Patient escorted to car by staff in a wheelchair. No distress noted upon discharge.   [name] R 11/08/2014 3:05 PM

## 2014-11-08 NOTE — Progress Notes (Signed)
PT Cancellation Note  Patient Details Name: Karen [name] MRN: [id number] DOB: 01/17/1946   Cancelled Treatment:    Reason Eval/Treat Not Completed: PT screened, no needs identified, will sign off Per OT, pt does not have any PT needs as she is ambulating supervision level and negotiating steps without difficulty. Pt understands back precautions. Will screen and sign off for now.    [name] 11/08/2014, 12:07 PM  [name], [place], DPT [phone]

## 2014-11-08 NOTE — Evaluation (Signed)
Occupational Therapy Evaluation and Discharge Patient Details Name: Karen [name] MRN: [id number] DOB: 01-06-1946 Today's Date: 11/08/2014    History of Present Illness Intra-articular cortisone injection right knee, I and D of posterior back wound with primary closure and insertion of antibiotic beads. S/P back surgery 6 weeks ago   Clinical Impression   This 69 yo female admitted with above presents to acute OT with all BADL education, re-iterated back precautions, per pt MD told her not to use back brace any more. Pt mobilizing at a S level and has this at home--did not see any PT needs and have made them aware. Acute OT will sign off.    Follow Up Recommendations  No OT follow up    Equipment Recommendations  None recommended by OT       Precautions / Restrictions Precautions Precautions: Back Precaution Comments: Re-iterated back precautions Restrictions Weight Bearing Restrictions: No      Mobility Bed Mobility               General bed mobility comments: Pt sitting EOB upon my arrival  Transfers Overall transfer level: Needs assistance Equipment used: Rolling walker (2 wheeled) Transfers: Sit to/from Stand Sit to Stand: Supervision         General transfer comment: Ambulated around 1/3 of 5N unit with RW with S also up down 2 steps with S         ADL                                         General ADL Comments: S with use of AE and DME     Vision Additional Comments: No change from baseline          Pertinent Vitals/Pain Pain Assessment: No/denies pain     Hand Dominance Right   Extremity/Trunk Assessment Upper Extremity Assessment Upper Extremity Assessment: Overall WFL for tasks assessed   Lower Extremity Assessment Lower Extremity Assessment: Overall WFL for tasks assessed       Communication Communication Communication: No difficulties   Cognition Arousal/Alertness: Awake/alert Behavior During Therapy: WFL  for tasks assessed/performed Overall Cognitive Status: Within Functional Limits for tasks assessed                                Home Living Family/patient expects to be discharged to:: Private residence Living Arrangements: Spouse/significant other (daughter) Available Help at Discharge: Available 24 hours/day Type of Home: House Home Access: Stairs to enter [employer] of Steps: 2 Entrance Stairs-Rails: None Home Layout: Two level;Able to live on main level with bedroom/bathroom     Bathroom Shower/Tub: Tub/shower unit;Curtain   Bathroom Toilet: Standard Bathroom Accessibility: Yes   Home Equipment: Grab bars - toilet;Grab bars - tub/shower;Walker - standard;Cane - single point;Shower seat;Bedside commode;Hand held shower head;Adaptive equipment Adaptive Equipment: Reacher;Sock aid        Prior Functioning/Environment Level of Independence: Independent with assistive device(s)        Comments: used cane for gait    OT Diagnosis: Generalized weakness         OT Goals(Current goals can be found in the care plan section) Acute Rehab OT Goals Patient Stated Goal: home today  OT Frequency:                End of Session Equipment Utilized During  Treatment: Rolling walker  Activity Tolerance: Patient tolerated treatment well Patient left: in chair;with call bell/phone within reach   Time: 0921-0938 OT Time Calculation (min): 17 min Charges:  OT General Charges $OT Visit: 1 Procedure OT Evaluation $Initial OT Evaluation Tier I: 1 Procedure G-Codes: OT G-codes **NOT FOR INPATIENT CLASS** Functional Assessment Tool Used: Clinical observation Functional Limitation: Self care Self Care Current Status (Z6109): At least 1 percent but less than 20 percent impaired, limited or restricted Self Care Goal Status (U0454): At least 1 percent but less than 20 percent impaired, limited or restricted Self Care Discharge Status [phone]): At least 1  percent but less than 20 percent impaired, limited or restricted  Almon Register [phone] 11/08/2014, 9:53 AM

## 2014-11-08 NOTE — Progress Notes (Signed)
   Subjective: 1 Day Post-Op Procedure(s) (LRB): IRRIGATION AND DEBRIDEMENT BACK WOUND, PLACEMENT OF ANTIBIOTIC BEADS (N/A) KNEE INJECTION (Right) Patient reports pain as mild.   Patient seen in rounds for Dr. Rolena Infante. Patient is well, and has had no acute complaints or problems. No issues overnight. No SOB or chest pain.    Objective: Vital signs in last 24 hours: Temp:  [98.2 F (36.8 C)-99 F (37.2 C)] 98.3 F (36.8 C) (05/28 0447) Pulse Rate:  [81-87] 81 (05/28 0447) Resp:  [16-21] 18 (05/28 0447) BP: (124-148)/(64-82) 128/75 mmHg (05/28 0447) SpO2:  [94 %-99 %] 95 % (05/28 0447) Weight:  [78.472 kg (173 lb)] 78.472 kg (173 lb) (05/27 1836)  Intake/Output from previous day:  Intake/Output Summary (Last 24 hours) at 11/08/14 0951 Last data filed at 11/08/14 0900  Gross per 24 hour  Intake 1749.67 ml  Output    625 ml  Net 1124.67 ml    Intake/Output this shift: Total I/O In: 240 [P.O.:240] Out: -   Labs:  Recent Labs  11/08/14 0454  HGB 10.8*    Recent Labs  11/08/14 0454  WBC 8.1  RBC 3.54*  HCT 32.6*  PLT 252    Recent Labs  11/08/14 0454  NA 138  K 3.5  CL 100*  CO2 27  BUN 10  CREATININE 0.84  GLUCOSE 167*  CALCIUM 8.8*    EXAM General - Patient is Alert and Oriented Extremity - Neurologically intact Neurovascular intact Dorsiflexion/Plantar flexion intact Dressing/Incision - clean, dry, no drainage Motor Function - intact, moving foot and toes well on exam.   Past Medical History  Diagnosis Date  . HTN (hypertension)   . Goiter   . Hypothyroidism   . Tubular adenoma   . Diverticulitis   . GERD (gastroesophageal reflux disease)     since gallbladder removed, no more GERD  . Sleep apnea     has never been tested.  was dx with cancer first and had this problem on the back burner  . Breast cancer 2004    bilateral mastectomy-all left lymph nodes removed; chemo. Patient states she had "spot" on the right breast as well.     Assessment/Plan: 1 Day Post-Op Procedure(s) (LRB): IRRIGATION AND DEBRIDEMENT BACK WOUND, PLACEMENT OF ANTIBIOTIC BEADS (N/A) KNEE INJECTION (Right) Active Problems:   Wound infection after surgery  Estimated body mass index is 30.65 kg/(m^2) as calculated from the following:   Height as of this encounter: 5\' 3"  (1.6 m).   Weight as of this encounter: 78.472 kg (173 lb). Advance diet Up with therapy D/C IV fluids  She is doing well this morning. Will have her complete her 24 hours of IV ancef. If she is feeling well this evening, will DC home.   Ardeen Jourdain, PA-C Orthopaedic Surgery 11/08/2014, 9:51 AM

## 2014-11-10 LAB — WOUND CULTURE: Culture: NO GROWTH

## 2014-11-12 LAB — ANAEROBIC CULTURE

## 2014-11-22 DIAGNOSIS — Z4789 Encounter for other orthopedic aftercare: Secondary | ICD-10-CM | POA: Diagnosis not present

## 2014-11-28 NOTE — Discharge Summary (Signed)
Patient ID: Karen [name] MRN: [id number] DOB/AGE: 01-16-1946 69 y.o.  Admit date: 11/07/2014 Discharge date: 11/28/2014  Admission Diagnoses:  Active Problems:   Wound infection after surgery   Discharge Diagnoses:  Active Problems:   Wound infection after surgery  status post Procedure(s): IRRIGATION AND DEBRIDEMENT BACK WOUND, PLACEMENT OF ANTIBIOTIC BEADS KNEE INJECTION  Past Medical History  Diagnosis Date  . HTN (hypertension)   . Goiter   . Hypothyroidism   . Tubular adenoma   . Diverticulitis   . GERD (gastroesophageal reflux disease)     since gallbladder removed, no more GERD  . Sleep apnea     has never been tested.  was dx with cancer first and had this problem on the back burner  . Breast cancer 2004    bilateral mastectomy-all left lymph nodes removed; chemo. Patient states she had "spot" on the right breast as well.    Surgeries: Procedure(s): IRRIGATION AND DEBRIDEMENT BACK WOUND, PLACEMENT OF ANTIBIOTIC BEADS KNEE INJECTION on 11/07/2014   Consultants:    Discharged Condition: Improved  Hospital Course: Karen [name] is an 69 y.o. female who was admitted 11/07/2014 for operative treatment of <principal problem not specified>. Patient failed conservative treatments (please see the history and physical for the specifics) and had severe unremitting pain that affects sleep, daily activities and work/hobbies. After pre-op clearance, the patient was taken to the operating room on 11/07/2014 and underwent  Procedure(s): IRRIGATION AND DEBRIDEMENT BACK WOUND, PLACEMENT OF ANTIBIOTIC BEADS KNEE INJECTION.    Patient was given perioperative antibiotics:  Anti-infectives    Start     Dose/Rate Route Frequency Ordered Stop   11/08/14 0400  ceFAZolin (ANCEF) IVPB 1 g/50 mL premix     1 g 100 mL/hr over 30 Minutes Intravenous Every 8 hours 11/07/14 2138 11/08/14 1532   11/07/14 1940  vancomycin (VANCOCIN) powder  Status:  Discontinued       As needed 11/07/14  1950 11/07/14 2019   11/07/14 0000  cephALEXin (KEFLEX) 500 MG capsule     500 mg Oral 4 times daily 11/07/14 2020         Patient was given sequential compression devices and early ambulation to prevent DVT.   Patient benefited maximally from hospital stay and there were no complications. At the time of discharge, the patient was urinating/moving their bowels without difficulty, tolerating a regular diet, pain is controlled with oral pain medications and they have been cleared by PT/OT.   Recent vital signs: No data found.    Recent laboratory studies: No results for input(s): WBC, HGB, HCT, PLT, NA, K, CL, CO2, BUN, CREATININE, GLUCOSE, INR, CALCIUM in the last 72 hours.  Invalid input(s): PT, 2   Discharge Medications:     Medication List    STOP taking these medications        ibuprofen 200 MG tablet  Commonly known as:  ADVIL,MOTRIN      TAKE these medications        ALPRAZolam 1 MG tablet  Commonly known as:  XANAX  Take 1 mg by mouth 3 (three) times daily as needed for anxiety.     amLODipine 10 MG tablet  Commonly known as:  NORVASC  Take 10 mg by mouth daily.     cephALEXin 500 MG capsule  Commonly known as:  KEFLEX  Take 1 capsule (500 mg total) by mouth 4 (four) times daily.     HYDROcodone-acetaminophen 10-325 MG per tablet  Commonly known as:  NORCO  Take 1 tablet by mouth every 6 (six) hours as needed.     levothyroxine 100 MCG tablet  Commonly known as:  SYNTHROID, LEVOTHROID  Take 100 mcg by mouth daily.     lisinopril-hydrochlorothiazide 20-12.5 MG per tablet  Commonly known as:  PRINZIDE,ZESTORETIC  Take 1 tablet by mouth 2 (two) times daily.     methocarbamol 500 MG tablet  Commonly known as:  ROBAXIN  Take 1 tablet (500 mg total) by mouth 3 (three) times daily as needed for muscle spasms.     ondansetron 4 MG tablet  Commonly known as:  ZOFRAN  Take 1 tablet (4 mg total) by mouth every 8 (eight) hours as needed for nausea or vomiting.         Diagnostic Studies: No results found.        Follow-up Information    Follow up with Dahlia Bailiff, MD. Schedule an appointment as soon as possible for a visit on 11/14/2014.   Specialty:  Orthopedic Surgery   Why:  For wound re-check, If symptoms worsen   Contact information:   [address] [place] [address] [phone]       Discharge Plan:  discharge to home  Disposition: doing well, plan for f/u in 1 week    Signed: Melina Schools D for Dr. Melina Schools [facility] Orthopa[facility] [phone] 11/28/2014, 4:24 PM

## 2014-12-01 ENCOUNTER — Encounter (HOSPITAL_COMMUNITY)
Admission: AD | Disposition: A | Discharge status: Skilled Nursing Facility | Payer: Self-pay | Source: Ambulatory Visit | Attending: Orthopedic Surgery

## 2014-12-01 ENCOUNTER — Inpatient Hospital Stay (HOSPITAL_COMMUNITY): Payer: Medicare Other | Admitting: Anesthesiology

## 2014-12-01 ENCOUNTER — Inpatient Hospital Stay (HOSPITAL_COMMUNITY): Payer: Medicare Other

## 2014-12-01 ENCOUNTER — Inpatient Hospital Stay (HOSPITAL_COMMUNITY)
Admission: AD | Admit: 2014-12-01 | Discharge: 2014-12-09 | DRG: 858 | Disposition: A | Discharge status: Skilled Nursing Facility | Payer: Medicare Other | Source: Ambulatory Visit | Attending: Orthopedic Surgery | Admitting: Orthopedic Surgery

## 2014-12-01 ENCOUNTER — Encounter (HOSPITAL_COMMUNITY): Payer: Self-pay | Admitting: Anesthesiology

## 2014-12-01 DIAGNOSIS — Z79899 Other long term (current) drug therapy: Secondary | ICD-10-CM | POA: Diagnosis not present

## 2014-12-01 DIAGNOSIS — T814XXA Infection following a procedure, initial encounter: Secondary | ICD-10-CM | POA: Diagnosis not present

## 2014-12-01 DIAGNOSIS — Y839 Surgical procedure, unspecified as the cause of abnormal reaction of the patient, or of later complication, without mention of misadventure at the time of the procedure: Secondary | ICD-10-CM | POA: Diagnosis not present

## 2014-12-01 DIAGNOSIS — Z853 Personal history of malignant neoplasm of breast: Secondary | ICD-10-CM | POA: Diagnosis not present

## 2014-12-01 DIAGNOSIS — M47816 Spondylosis without myelopathy or radiculopathy, lumbar region: Secondary | ICD-10-CM | POA: Diagnosis not present

## 2014-12-01 DIAGNOSIS — R262 Difficulty in walking, not elsewhere classified: Secondary | ICD-10-CM | POA: Diagnosis not present

## 2014-12-01 DIAGNOSIS — M4800 Spinal stenosis, site unspecified: Secondary | ICD-10-CM | POA: Diagnosis not present

## 2014-12-01 DIAGNOSIS — T8131XA Disruption of external operation (surgical) wound, not elsewhere classified, initial encounter: Secondary | ICD-10-CM | POA: Diagnosis not present

## 2014-12-01 DIAGNOSIS — Z87891 Personal history of nicotine dependence: Secondary | ICD-10-CM | POA: Diagnosis not present

## 2014-12-01 DIAGNOSIS — E039 Hypothyroidism, unspecified: Secondary | ICD-10-CM | POA: Diagnosis not present

## 2014-12-01 DIAGNOSIS — I10 Essential (primary) hypertension: Secondary | ICD-10-CM | POA: Diagnosis not present

## 2014-12-01 DIAGNOSIS — K429 Umbilical hernia without obstruction or gangrene: Secondary | ICD-10-CM | POA: Diagnosis not present

## 2014-12-01 DIAGNOSIS — M549 Dorsalgia, unspecified: Secondary | ICD-10-CM | POA: Diagnosis present

## 2014-12-01 DIAGNOSIS — R1032 Left lower quadrant pain: Secondary | ICD-10-CM | POA: Diagnosis not present

## 2014-12-01 DIAGNOSIS — G4730 Sleep apnea, unspecified: Secondary | ICD-10-CM | POA: Diagnosis not present

## 2014-12-01 DIAGNOSIS — Z419 Encounter for procedure for purposes other than remedying health state, unspecified: Secondary | ICD-10-CM

## 2014-12-01 DIAGNOSIS — M5136 Other intervertebral disc degeneration, lumbar region: Secondary | ICD-10-CM | POA: Diagnosis not present

## 2014-12-01 DIAGNOSIS — B9689 Other specified bacterial agents as the cause of diseases classified elsewhere: Secondary | ICD-10-CM | POA: Diagnosis not present

## 2014-12-01 DIAGNOSIS — K5900 Constipation, unspecified: Secondary | ICD-10-CM | POA: Diagnosis not present

## 2014-12-01 DIAGNOSIS — K219 Gastro-esophageal reflux disease without esophagitis: Secondary | ICD-10-CM | POA: Diagnosis present

## 2014-12-01 DIAGNOSIS — Z9013 Acquired absence of bilateral breasts and nipples: Secondary | ICD-10-CM | POA: Diagnosis present

## 2014-12-01 DIAGNOSIS — E049 Nontoxic goiter, unspecified: Secondary | ICD-10-CM | POA: Diagnosis not present

## 2014-12-01 DIAGNOSIS — B9561 Methicillin susceptible Staphylococcus aureus infection as the cause of diseases classified elsewhere: Secondary | ICD-10-CM | POA: Diagnosis present

## 2014-12-01 DIAGNOSIS — B999 Unspecified infectious disease: Secondary | ICD-10-CM | POA: Diagnosis not present

## 2014-12-01 DIAGNOSIS — Z978 Presence of other specified devices: Secondary | ICD-10-CM | POA: Diagnosis not present

## 2014-12-01 DIAGNOSIS — M6281 Muscle weakness (generalized): Secondary | ICD-10-CM | POA: Diagnosis not present

## 2014-12-01 DIAGNOSIS — S31000A Unspecified open wound of lower back and pelvis without penetration into retroperitoneum, initial encounter: Secondary | ICD-10-CM | POA: Diagnosis not present

## 2014-12-01 DIAGNOSIS — G9751 Postprocedural hemorrhage and hematoma of a nervous system organ or structure following a nervous system procedure: Secondary | ICD-10-CM | POA: Diagnosis not present

## 2014-12-01 DIAGNOSIS — R1310 Dysphagia, unspecified: Secondary | ICD-10-CM | POA: Diagnosis not present

## 2014-12-01 DIAGNOSIS — R278 Other lack of coordination: Secondary | ICD-10-CM | POA: Diagnosis not present

## 2014-12-01 DIAGNOSIS — K5732 Diverticulitis of large intestine without perforation or abscess without bleeding: Secondary | ICD-10-CM | POA: Diagnosis not present

## 2014-12-01 DIAGNOSIS — T8149XA Infection following a procedure, other surgical site, initial encounter: Secondary | ICD-10-CM | POA: Diagnosis present

## 2014-12-01 DIAGNOSIS — K625 Hemorrhage of anus and rectum: Secondary | ICD-10-CM | POA: Diagnosis not present

## 2014-12-01 HISTORY — PX: LUMBAR WOUND DEBRIDEMENT: SHX1988

## 2014-12-01 LAB — GRAM STAIN

## 2014-12-01 LAB — URINALYSIS, ROUTINE W REFLEX MICROSCOPIC
Bilirubin Urine: NEGATIVE
GLUCOSE, UA: NEGATIVE mg/dL
HGB URINE DIPSTICK: NEGATIVE
Ketones, ur: NEGATIVE mg/dL
Nitrite: NEGATIVE
Protein, ur: NEGATIVE mg/dL
Specific Gravity, Urine: 1.017 (ref 1.005–1.030)
Urobilinogen, UA: 0.2 mg/dL (ref 0.0–1.0)
pH: 5 (ref 5.0–8.0)

## 2014-12-01 LAB — CBC WITH DIFFERENTIAL/PLATELET
BASOS PCT: 0 % (ref 0–1)
Basophils Absolute: 0 10*3/uL (ref 0.0–0.1)
EOS ABS: 0.1 10*3/uL (ref 0.0–0.7)
Eosinophils Relative: 1 % (ref 0–5)
HEMATOCRIT: 28.6 % — AB (ref 36.0–46.0)
Hemoglobin: 9.6 g/dL — ABNORMAL LOW (ref 12.0–15.0)
LYMPHS ABS: 1.3 10*3/uL (ref 0.7–4.0)
Lymphocytes Relative: 11 % — ABNORMAL LOW (ref 12–46)
MCH: 31.2 pg (ref 26.0–34.0)
MCHC: 33.6 g/dL (ref 30.0–36.0)
MCV: 92.9 fL (ref 78.0–100.0)
MONO ABS: 1.5 10*3/uL — AB (ref 0.1–1.0)
MONOS PCT: 13 % — AB (ref 3–12)
NEUTROS PCT: 75 % (ref 43–77)
Neutro Abs: 8.3 10*3/uL — ABNORMAL HIGH (ref 1.7–7.7)
Platelets: 227 10*3/uL (ref 150–400)
RBC: 3.08 MIL/uL — ABNORMAL LOW (ref 3.87–5.11)
RDW: 13.7 % (ref 11.5–15.5)
WBC: 11.1 10*3/uL — ABNORMAL HIGH (ref 4.0–10.5)

## 2014-12-01 LAB — BASIC METABOLIC PANEL
ANION GAP: 8 (ref 5–15)
BUN: 17 mg/dL (ref 6–20)
CALCIUM: 9.1 mg/dL (ref 8.9–10.3)
CO2: 29 mmol/L (ref 22–32)
Chloride: 94 mmol/L — ABNORMAL LOW (ref 101–111)
Creatinine, Ser: 1.1 mg/dL — ABNORMAL HIGH (ref 0.44–1.00)
GFR calc Af Amer: 58 mL/min — ABNORMAL LOW (ref >60)
GFR calc non Af Amer: 50 mL/min — ABNORMAL LOW (ref >60)
Glucose, Bld: 108 mg/dL — ABNORMAL HIGH (ref 65–99)
POTASSIUM: 3 mmol/L — AB (ref 3.5–5.1)
SODIUM: 131 mmol/L — AB (ref 135–145)

## 2014-12-01 LAB — URINE MICROSCOPIC-ADD ON

## 2014-12-01 LAB — POCT I-STAT 4, (NA,K, GLUC, HGB,HCT)
GLUCOSE: 111 mg/dL — AB (ref 65–99)
HEMATOCRIT: 32 % — AB (ref 36.0–46.0)
Hemoglobin: 10.9 g/dL — ABNORMAL LOW (ref 12.0–15.0)
Potassium: 3 mmol/L — ABNORMAL LOW (ref 3.5–5.1)
SODIUM: 132 mmol/L — AB (ref 135–145)

## 2014-12-01 SURGERY — LUMBAR WOUND DEBRIDEMENT
Anesthesia: General

## 2014-12-01 MED ORDER — VANCOMYCIN HCL IN DEXTROSE 1-5 GM/200ML-% IV SOLN
INTRAVENOUS | Status: AC
  Administered 2014-12-01: 1000 mg via INTRAVENOUS
  Filled 2014-12-01: qty 200

## 2014-12-01 MED ORDER — HYDROCHLOROTHIAZIDE 12.5 MG PO CAPS
12.5000 mg | ORAL_CAPSULE | Freq: Two times a day (BID) | ORAL | Status: DC
  Administered 2014-12-02 – 2014-12-09 (×14): 12.5 mg via ORAL
  Filled 2014-12-01 (×15): qty 1

## 2014-12-01 MED ORDER — FENTANYL CITRATE (PF) 100 MCG/2ML IJ SOLN
INTRAMUSCULAR | Status: DC | PRN
  Administered 2014-12-01: 50 ug via INTRAVENOUS
  Administered 2014-12-01: 100 ug via INTRAVENOUS

## 2014-12-01 MED ORDER — ROCURONIUM BROMIDE 50 MG/5ML IV SOLN
INTRAVENOUS | Status: AC
  Filled 2014-12-01: qty 1

## 2014-12-01 MED ORDER — PHENYLEPHRINE HCL 10 MG/ML IJ SOLN
INTRAMUSCULAR | Status: DC | PRN
  Administered 2014-12-01: 80 ug via INTRAVENOUS

## 2014-12-01 MED ORDER — THROMBIN 20000 UNITS EX SOLR
CUTANEOUS | Status: AC
  Filled 2014-12-01: qty 20000

## 2014-12-01 MED ORDER — SODIUM CHLORIDE 0.9 % IJ SOLN
3.0000 mL | INTRAMUSCULAR | Status: DC | PRN
Start: 2014-12-01 — End: 2014-12-03

## 2014-12-01 MED ORDER — BUPIVACAINE-EPINEPHRINE (PF) 0.25% -1:200000 IJ SOLN
INTRAMUSCULAR | Status: AC
  Filled 2014-12-01: qty 30

## 2014-12-01 MED ORDER — NEOSTIGMINE METHYLSULFATE 10 MG/10ML IV SOLN
INTRAVENOUS | Status: DC | PRN
  Administered 2014-12-01: 3 mg via INTRAVENOUS

## 2014-12-01 MED ORDER — ACETAMINOPHEN 325 MG PO TABS: 650.0000 mg | ORAL_TABLET | ORAL | Status: DC | PRN

## 2014-12-01 MED ORDER — PROPOFOL 10 MG/ML IV BOLUS
INTRAVENOUS | Status: AC
  Filled 2014-12-01: qty 20

## 2014-12-01 MED ORDER — ONDANSETRON HCL 4 MG/2ML IJ SOLN
4.0000 mg | Freq: Four times a day (QID) | INTRAMUSCULAR | Status: DC
  Administered 2014-12-01: 4 mg via INTRAVENOUS
  Filled 2014-12-01: qty 2

## 2014-12-01 MED ORDER — ONDANSETRON HCL 4 MG/2ML IJ SOLN: 4.0000 mg | INTRAMUSCULAR | Status: DC | PRN

## 2014-12-01 MED ORDER — SODIUM CHLORIDE 0.9 % IV SOLN: 250.0000 mL | INTRAVENOUS | Status: DC

## 2014-12-01 MED ORDER — LEVOTHYROXINE SODIUM 100 MCG PO TABS
100.0000 ug | ORAL_TABLET | Freq: Every day | ORAL | Status: DC
  Administered 2014-12-02 – 2014-12-03 (×2): 100 ug via ORAL
  Filled 2014-12-01: qty 2
  Filled 2014-12-01: qty 1

## 2014-12-01 MED ORDER — ACETAMINOPHEN 10 MG/ML IV SOLN
INTRAVENOUS | Status: AC
  Administered 2014-12-01: 1000 mg via INTRAVENOUS
  Filled 2014-12-01: qty 100

## 2014-12-01 MED ORDER — DEXTROSE 5 % IV SOLN: 500.0000 mg | Freq: Four times a day (QID) | INTRAVENOUS | Status: DC | PRN

## 2014-12-01 MED ORDER — GLYCOPYRROLATE 0.2 MG/ML IJ SOLN
INTRAMUSCULAR | Status: DC | PRN
  Administered 2014-12-01: 0.4 mg via INTRAVENOUS

## 2014-12-01 MED ORDER — VANCOMYCIN HCL IN DEXTROSE 750-5 MG/150ML-% IV SOLN
750.0000 mg | Freq: Two times a day (BID) | INTRAVENOUS | Status: DC
  Administered 2014-12-02: 750 mg via INTRAVENOUS
  Filled 2014-12-01 (×2): qty 150

## 2014-12-01 MED ORDER — PHENOL 1.4 % MT LIQD: 1.0000 | OROMUCOSAL | Status: DC | PRN

## 2014-12-01 MED ORDER — MENTHOL 3 MG MT LOZG: 1.0000 | LOZENGE | OROMUCOSAL | Status: DC | PRN

## 2014-12-01 MED ORDER — SODIUM CHLORIDE 0.9 % IJ SOLN: 3.0000 mL | Freq: Two times a day (BID) | INTRAMUSCULAR | Status: DC

## 2014-12-01 MED ORDER — PROPOFOL 10 MG/ML IV BOLUS
INTRAVENOUS | Status: DC | PRN
  Administered 2014-12-01: 150 mg via INTRAVENOUS

## 2014-12-01 MED ORDER — PROMETHAZINE HCL 25 MG/ML IJ SOLN: 6.2500 mg | INTRAMUSCULAR | Status: DC | PRN

## 2014-12-01 MED ORDER — METHOCARBAMOL 500 MG PO TABS
500.0000 mg | ORAL_TABLET | Freq: Four times a day (QID) | ORAL | Status: DC | PRN
  Administered 2014-12-07 – 2014-12-09 (×4): 500 mg via ORAL
  Filled 2014-12-01 (×4): qty 1

## 2014-12-01 MED ORDER — HYDROMORPHONE HCL 1 MG/ML IJ SOLN: 0.2500 mg | INTRAMUSCULAR | Status: DC | PRN

## 2014-12-01 MED ORDER — AMLODIPINE BESYLATE 10 MG PO TABS
10.0000 mg | ORAL_TABLET | Freq: Every day | ORAL | Status: DC
Start: 2014-12-02 — End: 2014-12-09
  Administered 2014-12-03 – 2014-12-09 (×7): 10 mg via ORAL
  Filled 2014-12-01 (×8): qty 1

## 2014-12-01 MED ORDER — ALPRAZOLAM 0.5 MG PO TABS
1.0000 mg | ORAL_TABLET | Freq: Three times a day (TID) | ORAL | Status: DC | PRN
  Administered 2014-12-02 – 2014-12-08 (×7): 1 mg via ORAL
  Filled 2014-12-01 (×7): qty 2

## 2014-12-01 MED ORDER — LACTATED RINGERS IV SOLN
INTRAVENOUS | Status: DC
  Administered 2014-12-01 (×2): via INTRAVENOUS

## 2014-12-01 MED ORDER — LACTATED RINGERS IV SOLN
INTRAVENOUS | Status: DC
  Administered 2014-12-02: 85 mL/h via INTRAVENOUS
  Administered 2014-12-02 – 2014-12-03 (×3): via INTRAVENOUS

## 2014-12-01 MED ORDER — ROCURONIUM BROMIDE 100 MG/10ML IV SOLN
INTRAVENOUS | Status: DC | PRN
  Administered 2014-12-01: 40 mg via INTRAVENOUS

## 2014-12-01 MED ORDER — ACETAMINOPHEN 650 MG RE SUPP: 650.0000 mg | RECTAL | Status: DC | PRN

## 2014-12-01 MED ORDER — ONDANSETRON HCL 4 MG/2ML IJ SOLN
INTRAMUSCULAR | Status: DC | PRN
Start: 2014-12-01 — End: 2014-12-01
  Administered 2014-12-01: 4 mg via INTRAVENOUS

## 2014-12-01 MED ORDER — LISINOPRIL-HYDROCHLOROTHIAZIDE 20-12.5 MG PO TABS: 1.0000 | ORAL_TABLET | Freq: Two times a day (BID) | ORAL | Status: DC

## 2014-12-01 MED ORDER — HYDROCODONE-ACETAMINOPHEN 5-325 MG PO TABS
1.0000 | ORAL_TABLET | ORAL | Status: DC | PRN
  Administered 2014-12-02 (×3): 2 via ORAL
  Administered 2014-12-02: 1 via ORAL
  Administered 2014-12-02 – 2014-12-09 (×20): 2 via ORAL
  Filled 2014-12-01 (×8): qty 2
  Filled 2014-12-01: qty 1
  Filled 2014-12-01 (×15): qty 2

## 2014-12-01 MED ORDER — FENTANYL CITRATE (PF) 250 MCG/5ML IJ SOLN
INTRAMUSCULAR | Status: AC
  Filled 2014-12-01: qty 5

## 2014-12-01 MED ORDER — SODIUM CHLORIDE 0.9 % IR SOLN
Status: DC | PRN
  Administered 2014-12-01: 9000 mL

## 2014-12-01 MED ORDER — MIDAZOLAM HCL 2 MG/2ML IJ SOLN
INTRAMUSCULAR | Status: AC
  Filled 2014-12-01: qty 2

## 2014-12-01 MED ORDER — LISINOPRIL 20 MG PO TABS
20.0000 mg | ORAL_TABLET | Freq: Two times a day (BID) | ORAL | Status: DC
  Administered 2014-12-02 – 2014-12-09 (×14): 20 mg via ORAL
  Filled 2014-12-01 (×15): qty 1

## 2014-12-01 MED ORDER — LIDOCAINE HCL (CARDIAC) 20 MG/ML IV SOLN
INTRAVENOUS | Status: DC | PRN
Start: 2014-12-01 — End: 2014-12-01
  Administered 2014-12-01: 80 mg via INTRAVENOUS

## 2014-12-01 SURGICAL SUPPLY — 58 items
BUR EGG ELITE 4.0 (BURR) IMPLANT
CANISTER SUCTION 2500CC (MISCELLANEOUS) ×2 IMPLANT
CORDS BIPOLAR (ELECTRODE) IMPLANT
COVER SURGICAL LIGHT HANDLE (MISCELLANEOUS) ×2 IMPLANT
DRAPE POUCH INSTRU U-SHP 10X18 (DRAPES) IMPLANT
DRAPE PROXIMA HALF (DRAPES) ×2 IMPLANT
DRAPE SURG 17X23 STRL (DRAPES) ×2 IMPLANT
DRAPE U-SHAPE 47X51 STRL (DRAPES) ×2 IMPLANT
DRSG MEPILEX BORDER 4X8 (GAUZE/BANDAGES/DRESSINGS) IMPLANT
DRSG VAC ATS MED SENSATRAC (GAUZE/BANDAGES/DRESSINGS) ×2 IMPLANT
DRSG VAC ATS SM SENSATRAC (GAUZE/BANDAGES/DRESSINGS) ×2 IMPLANT
DURAPREP 26ML APPLICATOR (WOUND CARE) ×2 IMPLANT
ELECT BLADE 4.0 EZ CLEAN MEGAD (MISCELLANEOUS) ×2
ELECT CAUTERY BLADE 6.4 (BLADE) ×2 IMPLANT
ELECT PENCIL ROCKER SW 15FT (MISCELLANEOUS) IMPLANT
ELECT REM PT RETURN 9FT ADLT (ELECTROSURGICAL) ×2
ELECTRODE BLDE 4.0 EZ CLN MEGD (MISCELLANEOUS) ×1 IMPLANT
ELECTRODE REM PT RTRN 9FT ADLT (ELECTROSURGICAL) ×1 IMPLANT
EVACUATOR 1/8 PVC DRAIN (DRAIN) IMPLANT
GLOVE BIO SURGEON STRL SZ7.5 (GLOVE) ×2 IMPLANT
GLOVE BIOGEL PI IND STRL 8 (GLOVE) ×3 IMPLANT
GLOVE BIOGEL PI IND STRL 8.5 (GLOVE) ×1 IMPLANT
GLOVE BIOGEL PI INDICATOR 8 (GLOVE) ×3
GLOVE BIOGEL PI INDICATOR 8.5 (GLOVE) ×1
GLOVE ORTHO TXT STRL SZ7.5 (GLOVE) IMPLANT
GLOVE SS BIOGEL STRL SZ 8.5 (GLOVE) ×1 IMPLANT
GLOVE SUPERSENSE BIOGEL SZ 8.5 (GLOVE) ×1
GOWN STRL REUS W/ TWL LRG LVL3 (GOWN DISPOSABLE) IMPLANT
GOWN STRL REUS W/TWL 2XL LVL3 (GOWN DISPOSABLE) ×6 IMPLANT
GOWN STRL REUS W/TWL LRG LVL3 (GOWN DISPOSABLE)
KIT BASIN OR (CUSTOM PROCEDURE TRAY) ×2 IMPLANT
KIT ROOM TURNOVER OR (KITS) ×2 IMPLANT
NEEDLE 22X1 1/2 (OR ONLY) (NEEDLE) IMPLANT
NEEDLE SPNL 18GX3.5 QUINCKE PK (NEEDLE) IMPLANT
NS IRRIG 1000ML POUR BTL (IV SOLUTION) ×2 IMPLANT
PACK LAMINECTOMY ORTHO (CUSTOM PROCEDURE TRAY) ×2 IMPLANT
PACK UNIVERSAL I (CUSTOM PROCEDURE TRAY) ×2 IMPLANT
PAD ARMBOARD 7.5X6 YLW CONV (MISCELLANEOUS) ×4 IMPLANT
PAD NEG PRESSURE SENSATRAC (MISCELLANEOUS) ×2 IMPLANT
PATTIES SURGICAL .5 X.5 (GAUZE/BANDAGES/DRESSINGS) IMPLANT
PATTIES SURGICAL .5 X1 (DISPOSABLE) IMPLANT
SPONGE SURGIFOAM ABS GEL 100 (HEMOSTASIS) IMPLANT
STRIP CLOSURE SKIN 1/2X4 (GAUZE/BANDAGES/DRESSINGS) IMPLANT
SURGIFLO TRUKIT (HEMOSTASIS) IMPLANT
SUT BONE WAX W31G (SUTURE) IMPLANT
SUT MON AB 3-0 SH 27 (SUTURE)
SUT MON AB 3-0 SH27 (SUTURE) IMPLANT
SUT VIC AB 0 CT1 27 (SUTURE)
SUT VIC AB 0 CT1 27XBRD ANBCTR (SUTURE) IMPLANT
SUT VIC AB 1 CTX 36 (SUTURE)
SUT VIC AB 1 CTX36XBRD ANBCTR (SUTURE) IMPLANT
SUT VIC AB 2-0 CT1 18 (SUTURE) IMPLANT
SYR BULB IRRIGATION 50ML (SYRINGE) IMPLANT
SYR CONTROL 10ML LL (SYRINGE) ×2 IMPLANT
TOWEL OR 17X24 6PK STRL BLUE (TOWEL DISPOSABLE) ×2 IMPLANT
TOWEL OR 17X26 10 PK STRL BLUE (TOWEL DISPOSABLE) ×2 IMPLANT
WATER STERILE IRR 1000ML POUR (IV SOLUTION) IMPLANT
YANKAUER SUCT BULB TIP NO VENT (SUCTIONS) ×2 IMPLANT

## 2014-12-01 NOTE — Brief Op Note (Signed)
12/01/2014  10:13 PM  PATIENT:  Karen [name]  69 y.o. female  PRE-OPERATIVE DIAGNOSIS:  Wound Infection  POST-OPERATIVE DIAGNOSIS:  Wound Infection  PROCEDURE:  Procedure(s): IRRIGATION AND DEBRIDEMENT OF LUMBAR WOUND WITH VAC PLACEMENT (N/A)  SURGEON:  Surgeon(s) and Role:    * Melina Schools, MD - Primary  PHYSICIAN ASSISTANT:   ASSISTANTS: none   ANESTHESIA:   general  EBL:  Total I/O In: 93.8 [I.V.:93.8] Out: 100 [Urine:100]  BLOOD ADMINISTERED:none  DRAINS: none   LOCAL MEDICATIONS USED:  NONE  SPECIMEN:  Aspirate  DISPOSITION OF SPECIMEN:  micro  COUNTS:  YES  TOURNIQUET:  * No tourniquets in log *  DICTATION: .Other Dictation: Dictation Number [id number]  PLAN OF CARE: Admit to inpatient   PATIENT DISPOSITION:  PACU - hemodynamically stable.

## 2014-12-01 NOTE — H&P (Signed)
Karen Cahill, MD Chief Complaint: 2-3 day history of wound drainage History: Patient is 10 weeks s/p lumbar decompression and insertion of coflex device for spinal stenosis.  Post-op course complicated by wound dehisence 3-4 weeks after surgery.  Patient taken back to the OR for I+D of the wound and primary closure.  At that time there was no gross contamination of prulent drainage.  Patient was doing well for 2 weeks and stopped oral ABX on 11/21/14.  Presents to my office today with 2-3 day history of drainage.  As a resultgross contamination and drainage I elected to admit her and move forward with formal wash-out and vac dressing application.    Past Medical History  Diagnosis Date  . HTN (hypertension)   . Goiter   . Hypothyroidism   . Tubular adenoma   . Diverticulitis   . GERD (gastroesophageal reflux disease)     since gallbladder removed, no more GERD  . Sleep apnea     has never been tested.  was dx with cancer first and had this problem on the back burner  . Breast cancer 2004    bilateral mastectomy-all left lymph nodes removed; chemo. Patient states she had "spot" on the right breast as well.    Allergies  Allergen Reactions  . Sulfonamide Derivatives Anaphylaxis  . Morphine And Related Swelling    No current facility-administered medications on file prior to encounter.   Current Outpatient Prescriptions on File Prior to Encounter  Medication Sig Dispense Refill  . ALPRAZolam (XANAX) 1 MG tablet Take 1 mg by mouth 3 (three) times daily as needed for anxiety.     Marland Kitchen amLODipine (NORVASC) 10 MG tablet Take 10 mg by mouth daily.  0  . HYDROcodone-acetaminophen (NORCO) 10-325 MG per tablet Take 1 tablet by mouth every 6 (six) hours as needed. 60 tablet 0  . levothyroxine (SYNTHROID, LEVOTHROID) 100 MCG tablet Take 100 mcg by mouth every morning.   0  . lisinopril-hydrochlorothiazide (PRINZIDE,ZESTORETIC) 20-12.5 MG per tablet Take 1 tablet by mouth 2 (two) times daily.       Physical Exam: Filed Vitals:   12/01/14 1534  BP: 111/64  Pulse: 86  Temp: 99.2 F (37.3 C)  Resp: 16   A+O X3 NVI No significant radicular leg pain No SOB/CP Lungs CTA No incontinence of b/b at present Wound: expressed large amount of purulent material.  Foul odor  Sutures in place  Image: No results found.  A/P:  Patient is now 3 weeks s/p I&D of spinal wound dehiscence.  She is approximately 7 weeks s/p spinal decompression and insertion of coflex device for spinal stenosis.  Patient was last seen in my office on 11/22/14 and noted to be doing well.  THe wound was healing well, no erythema or swelling or tenderness.  She was making excellent progress and was taking oral ABX as directed.  Patient was to return in 1 week to have PDS sutures removed.  However, last week she had episode of GI bug which lead to nausea and diarrhea.  She stated that some time over the weekend she and daughter noticed a small amount of drainage.  She presents today because of large amount of drainage. In office noted to have frank brown foul smelling drainage from the wound. Unclear etiology of infection as she was improving. Patient with similar event earlier - didn't develop wound healing problem until 3-4 weeks post-op. Plan - return to [facility] for formal I&D and application of wound vac.  Would also consider removing coflex device if infection tracks below the deep fascia. Also obtain ID consult and place picc line for IV ABX course. Discussed with patient and daughter - risks/benefits reviewed all questions addressed.

## 2014-12-01 NOTE — Progress Notes (Signed)
Pt vitals charted in epic temp 102.5 call made to OR  Informed. Arthor Captain LPN

## 2014-12-01 NOTE — Anesthesia Procedure Notes (Signed)
Procedure Name: Intubation Date/Time: 12/01/2014 9:15 PM Performed by: Manuela Schwartz B Pre-anesthesia Checklist: Patient identified, Emergency Drugs available, Suction available, Patient being monitored and Timeout performed Patient Re-evaluated:Patient Re-evaluated prior to inductionOxygen Delivery Method: Circle system utilized Preoxygenation: Pre-oxygenation with 100% oxygen Intubation Type: IV induction Laryngoscope Size: Mac and 3 Grade View: Grade I Tube type: Oral Tube size: 7.0 mm Number of attempts: 1 Airway Equipment and Method: Stylet Placement Confirmation: ETT inserted through vocal cords under direct vision,  positive ETCO2 and breath sounds checked- equal and bilateral Secured at: 21 cm Tube secured with: Tape Dental Injury: Teeth and Oropharynx as per pre-operative assessment

## 2014-12-01 NOTE — Anesthesia Preprocedure Evaluation (Addendum)
Anesthesia Evaluation  Patient identified by MRN, date of birth, ID band Patient awake  General Assessment Comment:Febrile   Reviewed: Allergy & Precautions, NPO status   History of Anesthesia Complications Negative for: history of anesthetic complications  Airway Mallampati: II   Neck ROM: Full  Mouth opening: Limited Mouth Opening  Dental  (+) Dental Advisory Given, Poor Dentition, Missing   Pulmonary sleep apnea , former smoker,  breath sounds clear to auscultation        Cardiovascular hypertension, Pt. on medications Rhythm:Regular Rate:Normal     Neuro/Psych    GI/Hepatic GERD-  Medicated and Controlled,  Endo/Other  Hypothyroidism   Renal/GU      Musculoskeletal   Abdominal (+) + obese,   Peds  Hematology   Anesthesia Other Findings   Reproductive/Obstetrics                            Anesthesia Physical Anesthesia Plan  ASA: II and emergent  Anesthesia Plan: General   Post-op Pain Management:    Induction: Intravenous  Airway Management Planned: Oral ETT  Additional Equipment:   Intra-op Plan:   Post-operative Plan: Extubation in OR  Informed Consent: I have reviewed the patients History and Physical, chart, labs and discussed the procedure including the risks, benefits and alternatives for the proposed anesthesia with the patient or authorized representative who has indicated his/her understanding and acceptance.   Dental advisory given  Plan Discussed with: CRNA and Surgeon  Anesthesia Plan Comments:         Anesthesia Quick Evaluation

## 2014-12-01 NOTE — Transfer of Care (Signed)
Immediate Anesthesia Transfer of Care Note  Patient: Karen [name]  Procedure(s) Performed: Procedure(s): IRRIGATION AND DEBRIDEMENT OF LUMBAR WOUND WITH VAC PLACEMENT (N/A)  Patient Location: PACU  Anesthesia Type:General  Level of Consciousness: awake, alert  and patient cooperative  Airway & Oxygen Therapy: Patient Spontanous Breathing and Patient connected to nasal cannula oxygen  Post-op Assessment: Report given to RN and Post -op Vital signs reviewed and stable  Post vital signs: Reviewed and stable  Last Vitals:  Filed Vitals:   12/01/14 2010  BP: 96/75  Pulse: 89  Temp: 39.2 C  Resp: 18    Complications: No apparent anesthesia complications

## 2014-12-01 NOTE — Anesthesia Postprocedure Evaluation (Signed)
  Anesthesia Post-op Note  Patient: Karen [name]  Procedure(s) Performed: Procedure(s): IRRIGATION AND DEBRIDEMENT OF LUMBAR WOUND WITH VAC PLACEMENT (N/A)  Patient Location: PACU  Anesthesia Type:General  Level of Consciousness: awake and alert   Airway and Oxygen Therapy: Patient Spontanous Breathing  Post-op Pain: mild  Post-op Assessment: Post-op Vital signs reviewed LLE Motor Response: Responds to commands, Purposeful movement LLE Sensation: Full sensation RLE Motor Response: Purposeful movement, Responds to commands RLE Sensation: Full sensation      Post-op Vital Signs: stable  Last Vitals:  Filed Vitals:   12/01/14 2240  BP: 114/48  Pulse: 75  Temp:   Resp: 23    Complications: No apparent anesthesia complications

## 2014-12-01 NOTE — Progress Notes (Signed)
CRITICAL VALUE ALERT  Critical value received: wound culture Abundant wbc present both PMV mononuclear Rare gram cocci in clusters     Date of notification:  12/01/2014  Time of notification: 2231  Critical value read back:yes  Nurse who received alert: Arthor Captain  MD notified (1st page): yes   Time of first page:  2315  MD notified (2nd page):  Time of second page:  Responding MD:  Doren Custard PA  Time MD responded:  [phone]

## 2014-12-02 ENCOUNTER — Encounter (HOSPITAL_COMMUNITY): Payer: Self-pay | Admitting: Orthopedic Surgery

## 2014-12-02 DIAGNOSIS — T814XXA Infection following a procedure, initial encounter: Principal | ICD-10-CM

## 2014-12-02 DIAGNOSIS — B9689 Other specified bacterial agents as the cause of diseases classified elsewhere: Secondary | ICD-10-CM

## 2014-12-02 DIAGNOSIS — S31000A Unspecified open wound of lower back and pelvis without penetration into retroperitoneum, initial encounter: Secondary | ICD-10-CM

## 2014-12-02 DIAGNOSIS — Y839 Surgical procedure, unspecified as the cause of abnormal reaction of the patient, or of later complication, without mention of misadventure at the time of the procedure: Secondary | ICD-10-CM

## 2014-12-02 LAB — CBC
HCT: 28 % — ABNORMAL LOW (ref 36.0–46.0)
Hemoglobin: 9.2 g/dL — ABNORMAL LOW (ref 12.0–15.0)
MCH: 30.5 pg (ref 26.0–34.0)
MCHC: 32.9 g/dL (ref 30.0–36.0)
MCV: 92.7 fL (ref 78.0–100.0)
PLATELETS: 242 10*3/uL (ref 150–400)
RBC: 3.02 MIL/uL — AB (ref 3.87–5.11)
RDW: 13.6 % (ref 11.5–15.5)
WBC: 9.5 10*3/uL (ref 4.0–10.5)

## 2014-12-02 LAB — SURGICAL PCR SCREEN
MRSA, PCR: NEGATIVE
STAPHYLOCOCCUS AUREUS: NEGATIVE

## 2014-12-02 LAB — BASIC METABOLIC PANEL
Anion gap: 6 (ref 5–15)
BUN: 14 mg/dL (ref 6–20)
CO2: 32 mmol/L (ref 22–32)
Calcium: 8.8 mg/dL — ABNORMAL LOW (ref 8.9–10.3)
Chloride: 95 mmol/L — ABNORMAL LOW (ref 101–111)
Creatinine, Ser: 0.96 mg/dL (ref 0.44–1.00)
GFR, EST NON AFRICAN AMERICAN: 59 mL/min — AB (ref >60)
Glucose, Bld: 112 mg/dL — ABNORMAL HIGH (ref 65–99)
POTASSIUM: 3.1 mmol/L — AB (ref 3.5–5.1)
SODIUM: 133 mmol/L — AB (ref 135–145)

## 2014-12-02 MED ORDER — SODIUM CHLORIDE 0.9 % IJ SOLN: 10.0000 mL | INTRAMUSCULAR | Status: DC | PRN

## 2014-12-02 MED ORDER — VANCOMYCIN HCL 10 G IV SOLR
1250.0000 mg | INTRAVENOUS | Status: DC
  Administered 2014-12-02 – 2014-12-03 (×2): 1250 mg via INTRAVENOUS
  Filled 2014-12-02 (×2): qty 1250

## 2014-12-02 NOTE — Evaluation (Signed)
Physical Therapy Evaluation Patient Details Name: Karen [name] MRN: [id number] DOB: [birth year] Today's Date: 12/02/2014   History of Present Illness  Patient is 10 weeks s/p lumbar decompression and insertion of coflex device for spinal stenosis. Post-op course complicated by wound dehisence 3-4 weeks after surgery. Patient taken back to the OR for I+D of the wound and primary closure. At that time there was no gross contamination of prulent drainage. Patient was doing well for 2 weeks and stopped oral ABX on 11/21/14. Presents to my office today with 2-3 day history of drainage. As a resultgross contamination and drainage I elected to admit her and move forward with formal wash-out and vac dressing application  Clinical Impression  Pt with history of multiple back surgery and I and D due to infection. Pt with good carryover of back precautions. Pt tolerated mobility well despite 8/10 pain. con't to depend on RW for safe amb. Acute PT to follow to progress independence with mobility.    Follow Up Recommendations Home health PT;Supervision/Assistance - 24 hour    Equipment Recommendations  None recommended by PT    Recommendations for Other Services       Precautions / Restrictions Precautions Precautions: Back Precaution Booklet Issued: No Precaution Comments: pt able to recall from previous surgeries Restrictions Weight Bearing Restrictions: No      Mobility  Bed Mobility Overal bed mobility: Modified Independent             General bed mobility comments: pt demo'd ability to roll and transfer from sidelying to sitting EOB safely without physical assist  Transfers Overall transfer level: Needs assistance Equipment used: Rolling walker (2 wheeled) Transfers: Sit to/from Stand Sit to Stand: Min guard         General transfer comment: assist for line management, increased time due to pain, v/c's to achieve full upright posture  Ambulation/Gait Ambulation/Gait  assistance: Min guard Ambulation Distance (Feet): 150 Feet Assistive device: Rolling walker (2 wheeled) Gait Pattern/deviations: Step-through pattern;Antalgic Gait velocity: slow Gait velocity interpretation: Below normal speed for age/gender General Gait Details: pt with R antalgic limp, increased reliance on RW  Stairs            Wheelchair Mobility    Modified Rankin (Stroke Patients Only)       Balance Overall balance assessment: Needs assistance Sitting-balance support: Feet supported;No upper extremity supported Sitting balance-Leahy Scale: Fair     Standing balance support: During functional activity Standing balance-Leahy Scale: Fair Standing balance comment: pt able to stand at sink and wash hands without difficulty                             Pertinent Vitals/Pain Pain Assessment: 0-10 Pain Score: 8  Pain Location: back Pain Intervention(s): Monitored during session    Home Living Family/patient expects to be discharged to:: Private residence Living Arrangements: Spouse/significant other Available Help at Discharge: Available 24 hours/day Type of Home: House Home Access: Stairs to enter Entrance Stairs-Rails: None Entrance Stairs-Number of Steps: 1 Home Layout: Two level;Able to live on main level with bedroom/bathroom Home Equipment: Grab bars - toilet;Grab bars - tub/shower;Walker - standard;Cane - single point;Shower seat;Bedside commode;Hand held shower head;Adaptive equipment      Prior Function Level of Independence: Independent with assistive device(s)         Comments: using RW     Hand Dominance   Dominant Hand: Right    Extremity/Trunk Assessment   Upper  Extremity Assessment: Overall WFL for tasks assessed           Lower Extremity Assessment: Generalized weakness (limited MMT due to onset of pain in back with resistance)      Cervical / Trunk Assessment:  (back surgery)  Communication   Communication: No  difficulties  Cognition Arousal/Alertness: Awake/alert Behavior During Therapy: WFL for tasks assessed/performed Overall Cognitive Status: Within Functional Limits for tasks assessed                      General Comments General comments (skin integrity, edema, etc.): pt assisted to commode with min guard. pt supervision for hygiene s/p tolieting    Exercises        Assessment/Plan    PT Assessment Patient needs continued PT services  PT Diagnosis Difficulty walking;Acute pain   PT Problem List Decreased strength;Decreased activity tolerance;Decreased balance;Decreased mobility  PT Treatment Interventions DME instruction;Gait training;Stair training;Functional mobility training;Therapeutic activities;Therapeutic exercise   PT Goals (Current goals can be found in the Care Plan section) Acute Rehab PT Goals Patient Stated Goal: stop the infection PT Goal Formulation: With patient Time For Goal Achievement: 12/09/14 Potential to Achieve Goals: Good    Frequency Min 3X/week   Barriers to discharge        Co-evaluation               End of Session   Activity Tolerance: Patient tolerated treatment well Patient left: in chair;with call bell/phone within reach Nurse Communication: Mobility status         Time: 3383-2919 PT Time Calculation (min) (ACUTE ONLY): 18 min   Charges:   PT Evaluation $Initial PT Evaluation Tier I: 1 Procedure     PT G CodesKingsley Callander 12/02/2014, 11:11 AM   Kittie Plater, PT, DPT Pager #: [phone] Office #: [phone]

## 2014-12-02 NOTE — Progress Notes (Signed)
    Subjective: Procedure(s) (LRB): IRRIGATION AND DEBRIDEMENT OF LUMBAR WOUND WITH VAC PLACEMENT (N/A) 1 Day Post-Op  Patient reports pain as 2 on 0-10 scale.  Reports none leg pain reports incisional back pain   Positive void Negative bowel movement Positive flatus Negative chest pain or shortness of breath  Objective: Vital signs in last 24 hours: Temp:  [98.4 F (36.9 C)-102.5 F (39.2 C)] 98.4 F (36.9 C) (06/21 0629) Pulse Rate:  [66-89] 66 (06/21 0629) Resp:  [10-23] 23 (06/20 2240) BP: (80-117)/(43-75) 94/52 mmHg (06/21 0900) SpO2:  [94 %-100 %] 95 % (06/21 0629) Weight:  [78.5 kg (173 lb 1 oz)] 78.5 kg (173 lb 1 oz) (06/20 2300)  Intake/Output from previous day: 06/20 0701 - 06/21 0700 In: 1380.3 [I.V.:1380.3] Out: 500 [Urine:400; Drains:100]  Labs:  Recent Labs  12/01/14 2155 12/02/14 0726  WBC 11.1* 9.5  RBC 3.08* 3.02*  HCT 28.6* 28.0*  PLT 227 242    Recent Labs  12/01/14 2155 12/02/14 0726  NA 131* 133*  K 3.0* 3.1*  CL 94* 95*  CO2 29 32  BUN 17 14  CREATININE 1.10* 0.96  GLUCOSE 108* 112*  CALCIUM 9.1 8.8*   No results for input(s): LABPT, INR in the last 72 hours.  Physical Exam: Neurologically intact ABD soft Neurovascular intact Intact pulses distally Incision: VAC in place - functioning well Compartment soft  Assessment/Plan: Patient stable  xrays n/a Continue mobilization with physical therapy Continue care  Patient is stable WBC count improving.  Plan on return to OR tomorrow for [facility] change and repeat wash out Continue vanco.  Will have pharmacy manage dosage.   Melina Schools, MD [place] [phone]

## 2014-12-02 NOTE — Op Note (Signed)
Karen [name], Karen [name]                 ACCOUNT NO.:  [id number]  MEDICAL RECORD NO.:  [id number]  LOCATION:  5N22C                        FACILITY:  [facility]  PHYSICIAN:  Julliana Whitmyer D. Rolena [name], M.D. DATE OF BIRTH:  [birth year]  DATE OF PROCEDURE:  12/01/2014 DATE OF DISCHARGE:                              OPERATIVE REPORT   PREOPERATIVE DIAGNOSIS:  Wound infection.  POSTOPERATIVE DIAGNOSIS:  Wound infection.  OPERATIVE PROCEDURE:  I and D of wound and application of VAC.  COMPLICATIONS:  None.  CONDITION:  Stable.  HISTORY:  This is a very pleasant 69 year old woman, who 8 weeks ago had a lumbar decompression and implantation of a CoFlex device.  The patient did well for about 3 weeks and then had a small wound dehiscence due to the irritation from a drain.  She was taken back to the operating room for a wound closure.  The patient was doing exceptionally well, was seen on June 11, 3 weeks out from the wound closure with no wound healing complications, off antibiotics.  Three days ago, she started noticing increasing pain at the wound site only with no increasing radicular or recurrent neuropathic leg pain.  There was drainage from the wound and so, she was brought to my office today by her daughter.  In the office, there was frank pus coming from the wound.  This was the significant worsening prior to her second surgery.  As a result, she was admitted to the hospital for formal I and D and application of a VAC dressing.  In obtaining history, the patient did have an episode of food poisoning in her GI bug if she had diarrhea and nausea.  This was last week prior to the wound.  In discussing things with her daughter, I do believe the patient has had fecal contamination of her wound and this developed into an infection.  OPERATIVE NOTE:  The patient was brought to the operating room, placed supine on the operating table.  After successful induction of general anesthesia and endotracheal  intubation, TED and SCDs were applied.  She was turned prone onto the [place] frame.  All bony prominences were well padded.  Back was prepped and draped in a standard fashion.  Time-out was taken confirming the patient, procedure, and all other pertinent important data.  The sutures were removed and the skin itself was noted to be intact.  There was a small little area where the pus was emanating from the superior aspect.  I re-incised wound edges and then freshened. There was frank purulent foul-smelling brownish-colored purulent material in the superficial portion of the wound.  Cultures were taken and then a g of vancomycin was given.  I then debrided this wound edges, so that I had bleeding tissue sharply with a knife.  I then removed all questionable material from the wound itself.  Once this was done, I had fresh bleeding tissue.  I irrigated with 6 L of pulse lavage.  I then noted that the deep fascia was actually intact, it had completely healed.  I then incised the deep fascia and bluntly dissected with my finger down to the facet complex.  There was no purulent drainage  from this wound.  I dissected on both sides.  At this point, since she had no neuropathic symptoms and she was doing well from the decompression standpoint, I was very hesitant to remove the CoFlex device.  My concern is that if I removed the device, then she may actually have recurrence of her spinal stenosis or fracture that I could result in worsening pain.  Since there was no preoperative neurogenic pain and she was intact with no difficulty and the deep fascia was completely intact, I elected not to remove the devices.  I again irrigated with another 3 L of normal saline.  I then packed the wound with actually 2 VAC dressings.  I then placed a sterile dry dressing over the [facility] and then connected it and it was working fine.  The patient was then turned supine onto the bed and a UA was taken.  It should be  noted that during the process of urinary, there was a foul odor from the vaginal area.  At this point, the patient was ultimately extubated and transferred to PACU without incident.  We will check her cultures as well as urine specimen.  I also obtained an ID consult.  It was recommended, I may actually repeat the washout on Wednesday as well as do a dressing change.     Karen [name], M.D.     DDB/MEDQ  D:  12/01/2014  T:  12/02/2014  Job:  [id number]

## 2014-12-02 NOTE — Care Management (Signed)
Utilization review completed by Stevana Dufner N. Jaren Vanetten, RN BSN 

## 2014-12-02 NOTE — Consult Note (Signed)
Karen [name] for Infectious Disease     Reason for Consult: wound infection    Referring Physician: Dr. Rolena Infante  Principal Problem:   Wound infection after surgery   . amLODipine  10 mg Oral Daily  . lisinopril  20 mg Oral BID   And  . hydrochlorothiazide  12.5 mg Oral BID  . levothyroxine  100 mcg Oral QAC breakfast  . sodium chloride  3 mL Intravenous Q12H  . vancomycin  750 mg Intravenous Q12H    Recommendations: Continue with vancomycin pending cultures  picc placed  Assessment: She has a post op wound infection, suspicious for fecal contamination.  Gram stain is positive for GPC and no GNR, so vancomycin coverage empirically is adequate.   Antibiotics: Vancomycin Previously on oral antibiotics through 6/10  HPI: Karen [name] is a 69 y.o. female with GERD, HTN, who 10 weeks ago underwent lumbar decrompression of L3-4, L4-5 with insertion of coflex intervertebral interspinous process device.  About 6 weeks later, she underwent wound dehiscence and eschar formation and on 5/28 went back to OR.  No purulence noted but placed on Keflex with concern for wound infection.  Rare WBCs noted but no culture growth.  Comes back now with drainage from wound for 2-3 days and underwent I and D yesterday.  Gram stain with WBCs, superficial.  Gross purulence that was brownish colored.  No purulence in the deep fascia.       Review of Systems: A comprehensive review of systems was negative.  Past Medical History  Diagnosis Date  . HTN (hypertension)   . Goiter   . Hypothyroidism   . Tubular adenoma   . Diverticulitis   . GERD (gastroesophageal reflux disease)     since gallbladder removed, no more GERD  . Sleep apnea     has never been tested.  was dx with cancer first and had this problem on the back burner  . Breast cancer 2004    bilateral mastectomy-all left lymph nodes removed; chemo. Patient states she had "spot" on the right breast as well.    History  Substance  Use Topics  . Smoking status: Former Smoker -- 1.00 packs/day for 20 years    Quit date: 09/24/1995  . Smokeless tobacco: Not on file     Comment: Quit 1997  . Alcohol Use: No    Family History  Problem Relation Age of Onset  . Pancreatic cancer Mother     deceased  . Aneurysm Father     deceased, aortic  . Colon cancer Neg Hx   . Breast cancer Sister     deceased 8 years after initial diagnosis  . Breast cancer Sister     survivor  . Diverticulitis Brother     s/p partial colon resection   Allergies  Allergen Reactions  . Sulfonamide Derivatives Anaphylaxis  . Morphine And Related Swelling    OBJECTIVE: Blood pressure 94/52, pulse 66, temperature 98.4 F (36.9 C), temperature source Oral, resp. rate 23, height 5\' 3"  (1.6 m), weight 173 lb 1 oz (78.5 kg), SpO2 95 %. General: awake, alert, and Skin: no rashes Lungs: CTA B Cor: RRR Abdomen: soft, nt, nd Ext: no edema  Microbiology: Recent Results (from the past 240 hour(s))  Surgical pcr screen     Status: None   Collection Time: 12/01/14  7:53 PM  Result Value Ref Range Status   MRSA, PCR NEGATIVE NEGATIVE Final   Staphylococcus aureus NEGATIVE NEGATIVE Final  Comment:        The Xpert SA Assay (FDA approved for NASAL specimens in patients over 28 years of age), is one component of a comprehensive surveillance program.  Test performance has been validated by [facility] for patients greater than or equal to 72 year old. It is not intended to diagnose infection nor to guide or monitor treatment.   Anaerobic culture     Status: None (Preliminary result)   Collection Time: 12/01/14  9:31 PM  Result Value Ref Range Status   Specimen Description WOUND  Final   Special Requests LUMBAR  Final   Gram Stain   Final    ABUNDANT WBC PRESENT,BOTH PMN AND MONONUCLEAR NO SQUAMOUS EPITHELIAL CELLS SEEN RARE GRAM POSITIVE COCCI IN CLUSTERS Performed at [facility] Performed at [facility]     Culture   Final    NO ANAEROBES ISOLATED; CULTURE IN PROGRESS FOR 5 DAYS Performed at [facility]    Report Status PENDING  Incomplete  Wound culture     Status: None (Preliminary result)   Collection Time: 12/01/14  9:31 PM  Result Value Ref Range Status   Specimen Description WOUND  Final   Special Requests LUMBAR  Final   Gram Stain   Final    ABUNDANT WBC PRESENT,BOTH PMN AND MONONUCLEAR NO SQUAMOUS EPITHELIAL CELLS SEEN RARE GRAM POSITIVE COCCI IN PAIRS IN CLUSTERS Gram Stain Report Called to,Read Back By and Verified With: Gram Stain Report Called to,Read Back By and Verified With: V.BROWN AT 2232 ON [id number] BY A.BROWNING Performed at [facility] Performed at [facility]    Culture   Final    Culture reincubated for better growth Performed at [facility]    Report Status PENDING  Incomplete  Gram stain     Status: None   Collection Time: 12/01/14  9:31 PM  Result Value Ref Range Status   Specimen Description WOUND  Final   Special Requests LUMBAR  Final   Gram Stain   Final    ABUNDANT WBC PRESENT,BOTH PMN AND MONONUCLEAR RARE GRAM POSITIVE COCCI IN CLUSTERS Gram Stain Report Called to,Read Back By and Verified With: V BROWN 2232 12/01/14 A BROWNING    Report Status 12/01/2014 FINAL  Final  Fungus Culture with Smear     Status: None (Preliminary result)   Collection Time: 12/01/14  9:31 PM  Result Value Ref Range Status   Specimen Description WOUND  Final   Special Requests LUMBAR  Final   Fungal Smear   Final    NO YEAST OR FUNGAL ELEMENTS SEEN Performed at [facility]    Culture   Final    CULTURE IN PROGRESS FOR FOUR WEEKS Performed at [facility]    Report Status PENDING  Incomplete    COMER, Herbie [place], [employer] [facility] [place] [facility] [email or web address] [phone] pager  [phone] cell 12/02/2014, 10:47 AM

## 2014-12-02 NOTE — Consult Note (Signed)
Consult for [facility] recommendations, per ortho notes possible plans for OR washout again on Wednesday when next dressing is due.  I have left my contact pager number to discuss further.    Will await contact from Dr. Rolena Infante for clarification of VAC needs.  Dandrea Widdowson [place] RN,CWOCN [phone]

## 2014-12-02 NOTE — Evaluation (Signed)
Occupational Therapy Evaluation Patient Details Name: Karen [name] MRN: [id number] DOB: [birth year] Today's Date: 12/02/2014    History of Present Illness Patient is 10 weeks s/p lumbar decompression and insertion of coflex device for spinal stenosis. Post-op course complicated by wound dehisence 3-4 weeks after surgery. Patient taken back to the OR for I+D of the wound and primary closure. At that time there was no gross contamination of prulent drainage. Patient was doing well for 2 weeks and stopped oral ABX on 11/21/14. Presents to my office today with 2-3 day history of drainage. As a resultgross contamination and drainage I elected to admit her and move forward with formal wash-out and vac dressing application   Clinical Impression   Pt with decline and function and safety with ADLs and ADL mobility with decreased strength, balance and endurance. Pt would benefit from acute OT services to address impairments to increase level of function and safety to return home    Follow Up Recommendations  No OT follow up    Equipment Recommendations  None recommended by OT    Recommendations for Other Services       Precautions / Restrictions Precautions Precautions: Back Precaution Comments: pt able to recall from previous surgeries Restrictions Weight Bearing Restrictions: No      Mobility Bed Mobility               General bed mobility comments: pt up in recliner  Transfers Overall transfer level: Needs assistance Equipment used: Rolling walker (2 wheeled) Transfers: Sit to/from Stand Sit to Stand: Min guard         General transfer comment: assist for line management, increased time due to pain, v/c's to achieve full upright posture    Balance   Sitting-balance support: No upper extremity supported;Feet supported Sitting balance-Leahy Scale: Fair     Standing balance support: During functional activity Standing balance-Leahy Scale: Fair                               ADL Overall ADL's : Needs assistance/impaired     Grooming: Wash/dry hands;Wash/dry face;Standing;Supervision/safety   Upper Body Bathing: Supervision/ safety;Set up;Sitting   Lower Body Bathing: Minimal assistance;Sit to/from stand   Upper Body Dressing : Supervision/safety;Set up;Sitting   Lower Body Dressing: Minimal assistance;Sit to/from stand   Toilet Transfer: Min guard;Regular Toilet;Grab bars;RW   Toileting- Water quality scientist and Hygiene: Min guard;Sit to/from stand   Tub/ Banker: Min guard;3 in 1;Grab bars   Functional mobility during ADLs: Min guard       Vision  no change in baseline   Perception Perception Perception Tested?: No   Praxis Praxis Praxis tested?: Not tested    Pertinent Vitals/Pain Pain Assessment: 0-10 Pain Score: 7  Pain Location: back Pain Descriptors / Indicators: Aching;Sore Pain Intervention(s): Monitored during session;Repositioned     Hand Dominance Right   Extremity/Trunk Assessment Upper Extremity Assessment Upper Extremity Assessment: Overall WFL for tasks assessed       Cervical / Trunk Assessment Cervical / Trunk Assessment: Normal   Communication Communication Communication: No difficulties   Cognition Arousal/Alertness: Awake/alert Behavior During Therapy: WFL for tasks assessed/performed Overall Cognitive Status: Within Functional Limits for tasks assessed                     General Comments   pt pleasant and cooperative  Home Living Family/patient expects to be discharged to:: Private residence Living Arrangements: Spouse/significant other Available Help at Discharge: Available 24 hours/day Type of Home: House Home Access: Stairs to enter [employer] of Steps: 1 Entrance Stairs-Rails: None Home Layout: Two level;Able to live on main level with bedroom/bathroom     Bathroom Shower/Tub: Tub/shower unit;Curtain   Bathroom  Toilet: Standard Bathroom Accessibility: Yes   Home Equipment: Grab bars - toilet;Grab bars - tub/shower;Walker - standard;Cane - single point;Shower seat;Bedside commode;Hand held shower head;Adaptive equipment Adaptive Equipment: Reacher;Sock aid        Prior Functioning/Environment Level of Independence: Independent with assistive device(s)        Comments: using RW    OT Diagnosis: Generalized weakness   OT Problem List: Decreased strength;Decreased activity tolerance;Impaired balance (sitting and/or standing);Pain   OT Treatment/Interventions: Self-care/ADL training;Patient/family education;Therapeutic activities;DME and/or AE instruction    OT Goals(Current goals can be found in the care plan section) Acute Rehab OT Goals Patient Stated Goal: go home OT Goal Formulation: With patient Time For Goal Achievement: 12/09/14 Potential to Achieve Goals: Good ADL Goals Pt Will Perform Grooming: with set-up;with supervision;standing Pt Will Perform Lower Body Bathing: with min guard assist;with caregiver independent in assisting;with adaptive equipment;sit to/from stand Pt Will Perform Lower Body Dressing: with min guard assist;with caregiver independent in assisting;with adaptive equipment;sit to/from stand Pt Will Transfer to Toilet: with supervision;with modified independence;ambulating;regular height toilet;grab bars Pt Will Perform Toileting - Clothing Manipulation and hygiene: with supervision;sit to/from stand Pt Will Perform Tub/Shower Transfer: with supervision;shower seat;3 in 1;grab bars  OT Frequency: Min 2X/week   Barriers to D/C:    none                     End of Session Equipment Utilized During Treatment: Gait belt;Rolling walker  Activity Tolerance: Patient tolerated treatment well Patient left: in chair;with call bell/phone within reach   Time: 1131-1157 OT Time Calculation (min): 26 min Charges:  OT General Charges $OT Visit: 1 Procedure OT  Evaluation $Initial OT Evaluation Tier I: 1 Procedure OT Treatments $Therapeutic Activity: 8-22 mins G-Codes:    Britt Bottom 12/02/2014, 2:44 PM

## 2014-12-02 NOTE — Progress Notes (Signed)
ANTIBIOTIC CONSULT NOTE - INITIAL  Pharmacy Consult for Vancomycin Indication: wound  Allergies  Allergen Reactions  . Sulfonamide Derivatives Anaphylaxis  . Morphine And Related Swelling    Patient Measurements: Height: 5\' 3"  (160 cm) Weight: 173 lb 1 oz (78.5 kg) IBW/kg (Calculated) : 52.4  Vital Signs: Temp: 98.4 F (36.9 C) (06/21 0629) Temp Source: Oral (06/21 0629) BP: 94/52 mmHg (06/21 0900) Pulse Rate: 66 (06/21 0629) Intake/Output from previous day: 06/20 0701 - 06/21 0700 In: 1380.3 [I.V.:1380.3] Out: 500 [Urine:400; Drains:100] Intake/Output from this shift: Total I/O In: 429.1 [I.V.:279.1; IV Piggyback:150] Out: 50 [Drains:50]  Labs:  Recent Labs  12/01/14 2105 12/01/14 2155 12/02/14 0726  WBC  --  11.1* 9.5  HGB 10.9* 9.6* 9.2*  PLT  --  227 242  CREATININE  --  1.10* 0.96   Estimated Creatinine Clearance: 54.8 mL/min (by C-G formula based on Cr of 0.96). No results for input(s): VANCOTROUGH, VANCOPEAK, VANCORANDOM, GENTTROUGH, GENTPEAK, GENTRANDOM, TOBRATROUGH, TOBRAPEAK, TOBRARND, AMIKACINPEAK, AMIKACINTROU, AMIKACIN in the last 72 hours.   Microbiology: Recent Results (from the past 720 hour(s))  Surgical pcr screen     Status: None   Collection Time: 11/07/14  6:50 PM  Result Value Ref Range Status   MRSA, PCR NEGATIVE NEGATIVE Final   Staphylococcus aureus NEGATIVE NEGATIVE Final    Comment:        The Xpert SA Assay (FDA approved for NASAL specimens in patients over 79 years of age), is one component of a comprehensive surveillance program.  Test performance has been validated by [facility] for patients greater than or equal to 68 year old. It is not intended to diagnose infection nor to guide or monitor treatment.   Anaerobic culture     Status: None   Collection Time: 11/07/14  7:33 PM  Result Value Ref Range Status   Specimen Description WOUND  Final   Special Requests DEEP BACK WOUND  Final   Gram Stain   Final    RARE  WBC PRESENT,BOTH PMN AND MONONUCLEAR NO SQUAMOUS EPITHELIAL CELLS SEEN NO ORGANISMS SEEN Performed at [facility] Performed at [facility]    Culture   Final    NO ANAEROBES ISOLATED Performed at [facility]    Report Status 11/12/2014 FINAL  Final  Fungus Culture with Smear     Status: None (Preliminary result)   Collection Time: 11/07/14  7:33 PM  Result Value Ref Range Status   Specimen Description WOUND  Final   Special Requests DEEP BACK WOUND  Final   Fungal Smear   Final    NO YEAST OR FUNGAL ELEMENTS SEEN Performed at [facility]    Culture   Final    CULTURE IN PROGRESS FOR FOUR WEEKS Performed at [facility]    Report Status PENDING  Incomplete  Gram stain     Status: None   Collection Time: 11/07/14  7:33 PM  Result Value Ref Range Status   Specimen Description WOUND  Final   Special Requests DEEP BACK WOUND  Final   Gram Stain   Final    RARE WBC PRESENT,BOTH PMN AND MONONUCLEAR NO ORGANISMS SEEN    Report Status 11/07/2014 FINAL  Final  Wound culture     Status: None   Collection Time: 11/07/14  7:33 PM  Result Value Ref Range Status   Specimen Description WOUND  Final   Special Requests DEEP BACK WOUND  Final   Gram Stain  Final    RARE WBC PRESENT,BOTH PMN AND MONONUCLEAR NO SQUAMOUS EPITHELIAL CELLS SEEN NO ORGANISMS SEEN Performed at [facility] Performed at [facility]    Culture   Final    NO GROWTH 2 DAYS Performed at [facility]    Report Status 11/10/2014 FINAL  Final  Surgical pcr screen     Status: None   Collection Time: 12/01/14  7:53 PM  Result Value Ref Range Status   MRSA, PCR NEGATIVE NEGATIVE Final   Staphylococcus aureus NEGATIVE NEGATIVE Final    Comment:        The Xpert SA Assay (FDA approved for NASAL specimens in patients over 72 years of age), is one component of a comprehensive surveillance program.  Test performance has been validated by  [facility] for patients greater than or equal to 75 year old. It is not intended to diagnose infection nor to guide or monitor treatment.   Anaerobic culture     Status: None (Preliminary result)   Collection Time: 12/01/14  9:31 PM  Result Value Ref Range Status   Specimen Description WOUND  Final   Special Requests LUMBAR  Final   Gram Stain   Final    ABUNDANT WBC PRESENT,BOTH PMN AND MONONUCLEAR NO SQUAMOUS EPITHELIAL CELLS SEEN RARE GRAM POSITIVE COCCI IN CLUSTERS Performed at [facility] Performed at [facility]    Culture   Final    NO ANAEROBES ISOLATED; CULTURE IN PROGRESS FOR 5 DAYS Performed at [facility]    Report Status PENDING  Incomplete  Wound culture     Status: None (Preliminary result)   Collection Time: 12/01/14  9:31 PM  Result Value Ref Range Status   Specimen Description WOUND  Final   Special Requests LUMBAR  Final   Gram Stain   Final    ABUNDANT WBC PRESENT,BOTH PMN AND MONONUCLEAR NO SQUAMOUS EPITHELIAL CELLS SEEN RARE GRAM POSITIVE COCCI IN PAIRS IN CLUSTERS Gram Stain Report Called to,Read Back By and Verified With: Gram Stain Report Called to,Read Back By and Verified With: V.BROWN AT 2232 ON [id number] BY A.BROWNING Performed at [facility] Performed at [facility]    Culture   Final    Culture reincubated for better growth Performed at [facility]    Report Status PENDING  Incomplete  Gram stain     Status: None   Collection Time: 12/01/14  9:31 PM  Result Value Ref Range Status   Specimen Description WOUND  Final   Special Requests LUMBAR  Final   Gram Stain   Final    ABUNDANT WBC PRESENT,BOTH PMN AND MONONUCLEAR RARE GRAM POSITIVE COCCI IN CLUSTERS Gram Stain Report Called to,Read Back By and Verified With: V BROWN 2232 12/01/14 A BROWNING    Report Status 12/01/2014 FINAL  Final  Fungus Culture with Smear     Status: None (Preliminary result)   Collection Time: 12/01/14  9:31 PM   Result Value Ref Range Status   Specimen Description WOUND  Final   Special Requests LUMBAR  Final   Fungal Smear   Final    NO YEAST OR FUNGAL ELEMENTS SEEN Performed at [facility]    Culture   Final    CULTURE IN PROGRESS FOR FOUR WEEKS Performed at [facility]    Report Status PENDING  Incomplete    Medical History: Past Medical History  Diagnosis Date  . HTN (hypertension)   . Goiter   .  Hypothyroidism   . Tubular adenoma   . Diverticulitis   . GERD (gastroesophageal reflux disease)     since gallbladder removed, no more GERD  . Sleep apnea     has never been tested.  was dx with cancer first and had this problem on the back burner  . Breast cancer 2004    bilateral mastectomy-all left lymph nodes removed; chemo. Patient states she had "spot" on the right breast as well.    Assessment: 69 year old female 10 weeks s/p lumbar decompression and insertion of device for spinal stenosis.  Post-op course complicated by wound dehisence 3 to 4 weeks after surgery.  Patient taken to the OR for I +D and was placed on oral antibiotics until 6/10.  She now returns with a 2 to 3 days history of drainage from her wound -- Pharmacy asked to start vancomycin s/p further I+D and wound vac placement  Goal of Therapy:  Vancomycin trough level 10-15 mcg/ml  Plan:  Vancomycin 1250 mg iv Q 24 hours Follow up progress, fever trend, Scr, and cultures  Thank you. Anette Guarneri, PharmD  12/02/2014,1:03 PM

## 2014-12-02 NOTE — Progress Notes (Signed)
Peripherally Inserted Central Catheter/Midline Placement  The IV Nurse has discussed with the patient and/or persons authorized to consent for the patient, the purpose of this procedure and the potential benefits and risks involved with this procedure.  The benefits include less needle sticks, lab draws from the catheter and patient may be discharged home with the catheter.  Risks include, but not limited to, infection, bleeding, blood clot (thrombus formation), and puncture of an artery; nerve damage and irregular heat beat.  Alternatives to this procedure were also discussed.  PICC/Midline Placement Documentation        Karen [name] 12/02/2014, 9:55 AM

## 2014-12-03 ENCOUNTER — Encounter (HOSPITAL_COMMUNITY)
Admission: AD | Disposition: A | Discharge status: Skilled Nursing Facility | Payer: Self-pay | Source: Ambulatory Visit | Attending: Orthopedic Surgery

## 2014-12-03 ENCOUNTER — Inpatient Hospital Stay (HOSPITAL_COMMUNITY): Payer: Medicare Other | Admitting: Anesthesiology

## 2014-12-03 DIAGNOSIS — B9561 Methicillin susceptible Staphylococcus aureus infection as the cause of diseases classified elsewhere: Secondary | ICD-10-CM

## 2014-12-03 HISTORY — PX: LUMBAR WOUND DEBRIDEMENT: SHX1988

## 2014-12-03 LAB — URINE CULTURE: Culture: NO GROWTH

## 2014-12-03 SURGERY — LUMBAR WOUND DEBRIDEMENT
Anesthesia: General | Site: Back

## 2014-12-03 MED ORDER — ROCURONIUM BROMIDE 50 MG/5ML IV SOLN
INTRAVENOUS | Status: AC
  Filled 2014-12-03: qty 1

## 2014-12-03 MED ORDER — FENTANYL CITRATE (PF) 250 MCG/5ML IJ SOLN
INTRAMUSCULAR | Status: AC
  Filled 2014-12-03: qty 5

## 2014-12-03 MED ORDER — LACTATED RINGERS IV SOLN
INTRAVENOUS | Status: DC
  Administered 2014-12-04: 02:00:00 via INTRAVENOUS

## 2014-12-03 MED ORDER — METHOCARBAMOL 500 MG PO TABS
500.0000 mg | ORAL_TABLET | Freq: Four times a day (QID) | ORAL | Status: DC | PRN
Start: 2014-12-03 — End: 2014-12-03

## 2014-12-03 MED ORDER — PROPOFOL 10 MG/ML IV BOLUS
INTRAVENOUS | Status: AC
  Filled 2014-12-03: qty 20

## 2014-12-03 MED ORDER — MIDAZOLAM HCL 2 MG/2ML IJ SOLN: 0.5000 mg | Freq: Once | INTRAMUSCULAR | Status: DC | PRN

## 2014-12-03 MED ORDER — SODIUM CHLORIDE 0.9 % IV SOLN: 250.0000 mL | INTRAVENOUS | Status: DC

## 2014-12-03 MED ORDER — ONDANSETRON HCL 4 MG/2ML IJ SOLN
INTRAMUSCULAR | Status: AC
  Filled 2014-12-03: qty 2

## 2014-12-03 MED ORDER — ROCURONIUM BROMIDE 100 MG/10ML IV SOLN
INTRAVENOUS | Status: DC | PRN
  Administered 2014-12-03: 40 mg via INTRAVENOUS

## 2014-12-03 MED ORDER — SODIUM CHLORIDE 0.9 % IJ SOLN
3.0000 mL | Freq: Two times a day (BID) | INTRAMUSCULAR | Status: DC
  Administered 2014-12-08: 10 mL via INTRAVENOUS

## 2014-12-03 MED ORDER — PHENOL 1.4 % MT LIQD: 1.0000 | OROMUCOSAL | Status: DC | PRN

## 2014-12-03 MED ORDER — SUCCINYLCHOLINE CHLORIDE 20 MG/ML IJ SOLN
INTRAMUSCULAR | Status: AC
  Filled 2014-12-03: qty 1

## 2014-12-03 MED ORDER — MIDAZOLAM HCL 2 MG/2ML IJ SOLN
INTRAMUSCULAR | Status: AC
  Filled 2014-12-03: qty 2

## 2014-12-03 MED ORDER — PROPOFOL 10 MG/ML IV BOLUS
INTRAVENOUS | Status: DC | PRN
  Administered 2014-12-03: 100 mg via INTRAVENOUS

## 2014-12-03 MED ORDER — MEPERIDINE HCL 25 MG/ML IJ SOLN: 6.2500 mg | INTRAMUSCULAR | Status: DC | PRN

## 2014-12-03 MED ORDER — 0.9 % SODIUM CHLORIDE (POUR BTL) OPTIME
TOPICAL | Status: DC | PRN
  Administered 2014-12-03: 1000 mL

## 2014-12-03 MED ORDER — SODIUM CHLORIDE 0.9 % IR SOLN
Status: DC | PRN
  Administered 2014-12-03: 3000 mL

## 2014-12-03 MED ORDER — MENTHOL 3 MG MT LOZG: 1.0000 | LOZENGE | OROMUCOSAL | Status: DC | PRN

## 2014-12-03 MED ORDER — LIDOCAINE HCL (CARDIAC) 20 MG/ML IV SOLN
INTRAVENOUS | Status: AC
Start: 2014-12-03 — End: 2014-12-03
  Filled 2014-12-03: qty 5

## 2014-12-03 MED ORDER — METHOCARBAMOL 1000 MG/10ML IJ SOLN
500.0000 mg | Freq: Four times a day (QID) | INTRAMUSCULAR | Status: DC | PRN
  Filled 2014-12-03: qty 5

## 2014-12-03 MED ORDER — SODIUM CHLORIDE 0.9 % IJ SOLN: 3.0000 mL | INTRAMUSCULAR | Status: DC | PRN

## 2014-12-03 MED ORDER — HYDROMORPHONE HCL 1 MG/ML IJ SOLN
INTRAMUSCULAR | Status: AC
  Filled 2014-12-03: qty 1

## 2014-12-03 MED ORDER — NEOSTIGMINE METHYLSULFATE 10 MG/10ML IV SOLN
INTRAVENOUS | Status: DC | PRN
  Administered 2014-12-03: 5 mg via INTRAVENOUS

## 2014-12-03 MED ORDER — ONDANSETRON HCL 4 MG/2ML IJ SOLN
INTRAMUSCULAR | Status: DC | PRN
  Administered 2014-12-03: 4 mg via INTRAVENOUS

## 2014-12-03 MED ORDER — HYDROMORPHONE HCL 1 MG/ML IJ SOLN
0.2500 mg | INTRAMUSCULAR | Status: DC | PRN
  Administered 2014-12-03 (×2): 0.5 mg via INTRAVENOUS

## 2014-12-03 MED ORDER — ONDANSETRON HCL 4 MG/2ML IJ SOLN: 4.0000 mg | INTRAMUSCULAR | Status: DC | PRN

## 2014-12-03 MED ORDER — PROMETHAZINE HCL 25 MG/ML IJ SOLN: 6.2500 mg | INTRAMUSCULAR | Status: DC | PRN

## 2014-12-03 MED ORDER — LACTATED RINGERS IV SOLN
INTRAVENOUS | Status: DC | PRN
  Administered 2014-12-03 (×2): via INTRAVENOUS

## 2014-12-03 MED ORDER — LIDOCAINE HCL (CARDIAC) 20 MG/ML IV SOLN
INTRAVENOUS | Status: DC | PRN
  Administered 2014-12-03: 40 mg via INTRAVENOUS

## 2014-12-03 MED ORDER — NEOSTIGMINE METHYLSULFATE 10 MG/10ML IV SOLN
INTRAVENOUS | Status: AC
  Filled 2014-12-03: qty 1

## 2014-12-03 MED ORDER — FENTANYL CITRATE (PF) 100 MCG/2ML IJ SOLN
INTRAMUSCULAR | Status: DC | PRN
  Administered 2014-12-03: 150 ug via INTRAVENOUS

## 2014-12-03 MED ORDER — GLYCOPYRROLATE 0.2 MG/ML IJ SOLN
INTRAMUSCULAR | Status: AC
  Filled 2014-12-03: qty 3

## 2014-12-03 SURGICAL SUPPLY — 55 items
BUR EGG ELITE 4.0 (BURR) IMPLANT
CANISTER SUCTION 2500CC (MISCELLANEOUS) ×2 IMPLANT
CANISTER WOUND CARE 500ML ATS (WOUND CARE) ×2 IMPLANT
CORDS BIPOLAR (ELECTRODE) ×2 IMPLANT
COVER SURGICAL LIGHT HANDLE (MISCELLANEOUS) ×2 IMPLANT
DRAPE POUCH INSTRU U-SHP 10X18 (DRAPES) ×2 IMPLANT
DRAPE PROXIMA HALF (DRAPES) ×2 IMPLANT
DRAPE SURG 17X23 STRL (DRAPES) ×2 IMPLANT
DRAPE U-SHAPE 47X51 STRL (DRAPES) ×2 IMPLANT
DRSG MEPILEX BORDER 4X8 (GAUZE/BANDAGES/DRESSINGS) ×2 IMPLANT
DURAPREP 26ML APPLICATOR (WOUND CARE) ×2 IMPLANT
ELECT BLADE 4.0 EZ CLEAN MEGAD (MISCELLANEOUS)
ELECT CAUTERY BLADE 6.4 (BLADE) ×2 IMPLANT
ELECT PENCIL ROCKER SW 15FT (MISCELLANEOUS) ×2 IMPLANT
ELECT REM PT RETURN 9FT ADLT (ELECTROSURGICAL) ×2
ELECTRODE BLDE 4.0 EZ CLN MEGD (MISCELLANEOUS) IMPLANT
ELECTRODE REM PT RTRN 9FT ADLT (ELECTROSURGICAL) ×1 IMPLANT
EVACUATOR 1/8 PVC DRAIN (DRAIN) IMPLANT
GLOVE BIOGEL PI IND STRL 8 (GLOVE) ×1 IMPLANT
GLOVE BIOGEL PI IND STRL 8.5 (GLOVE) ×1 IMPLANT
GLOVE BIOGEL PI INDICATOR 8 (GLOVE) ×1
GLOVE BIOGEL PI INDICATOR 8.5 (GLOVE) ×1
GLOVE ORTHO TXT STRL SZ7.5 (GLOVE) ×2 IMPLANT
GLOVE SS BIOGEL STRL SZ 8.5 (GLOVE) ×1 IMPLANT
GLOVE SUPERSENSE BIOGEL SZ 8.5 (GLOVE) ×1
GOWN STRL REUS W/ TWL LRG LVL3 (GOWN DISPOSABLE) ×1 IMPLANT
GOWN STRL REUS W/TWL 2XL LVL3 (GOWN DISPOSABLE) ×4 IMPLANT
GOWN STRL REUS W/TWL LRG LVL3 (GOWN DISPOSABLE) ×1
KIT BASIN OR (CUSTOM PROCEDURE TRAY) ×2 IMPLANT
KIT ROOM TURNOVER OR (KITS) ×2 IMPLANT
NEEDLE 22X1 1/2 (OR ONLY) (NEEDLE) ×2 IMPLANT
NEEDLE SPNL 18GX3.5 QUINCKE PK (NEEDLE) ×4 IMPLANT
NS IRRIG 1000ML POUR BTL (IV SOLUTION) ×2 IMPLANT
PACK LAMINECTOMY ORTHO (CUSTOM PROCEDURE TRAY) ×2 IMPLANT
PACK UNIVERSAL I (CUSTOM PROCEDURE TRAY) ×2 IMPLANT
PAD ARMBOARD 7.5X6 YLW CONV (MISCELLANEOUS) ×4 IMPLANT
PATTIES SURGICAL .5 X.5 (GAUZE/BANDAGES/DRESSINGS) IMPLANT
PATTIES SURGICAL .5 X1 (DISPOSABLE) IMPLANT
SPONGE SURGIFOAM ABS GEL 100 (HEMOSTASIS) IMPLANT
STRIP CLOSURE SKIN 1/2X4 (GAUZE/BANDAGES/DRESSINGS) ×2 IMPLANT
SURGIFLO TRUKIT (HEMOSTASIS) IMPLANT
SUT BONE WAX W31G (SUTURE) ×2 IMPLANT
SUT MON AB 3-0 SH 27 (SUTURE) ×1
SUT MON AB 3-0 SH27 (SUTURE) ×1 IMPLANT
SUT VIC AB 0 CT1 27 (SUTURE) ×1
SUT VIC AB 0 CT1 27XBRD ANBCTR (SUTURE) ×1 IMPLANT
SUT VIC AB 1 CTX 36 (SUTURE) ×2
SUT VIC AB 1 CTX36XBRD ANBCTR (SUTURE) ×2 IMPLANT
SUT VIC AB 2-0 CT1 18 (SUTURE) ×2 IMPLANT
SYR BULB IRRIGATION 50ML (SYRINGE) ×2 IMPLANT
SYR CONTROL 10ML LL (SYRINGE) ×2 IMPLANT
TOWEL OR 17X24 6PK STRL BLUE (TOWEL DISPOSABLE) ×2 IMPLANT
TOWEL OR 17X26 10 PK STRL BLUE (TOWEL DISPOSABLE) ×2 IMPLANT
WATER STERILE IRR 1000ML POUR (IV SOLUTION) ×2 IMPLANT
YANKAUER SUCT BULB TIP NO VENT (SUCTIONS) ×2 IMPLANT

## 2014-12-03 NOTE — Anesthesia Preprocedure Evaluation (Addendum)
Anesthesia Evaluation  Patient identified by MRN, date of birth, ID band Patient awake    Reviewed: Allergy & Precautions, NPO status , Patient's Chart, lab work & pertinent test results  History of Anesthesia Complications Negative for: history of anesthetic complications  Airway Mallampati: II  TM Distance: >3 FB Neck ROM: Full    Dental  (+) Missing, Chipped, Dental Advisory Given   Pulmonary former smoker (quit 1997),  breath sounds clear to auscultation        Cardiovascular hypertension, Pt. on medications - anginaRhythm:Regular Rate:Normal     Neuro/Psych negative neurological ROS     GI/Hepatic Neg liver ROS, Cholecystectomy 2014 cleared symptoms   Endo/Other  Hypothyroidism Morbid obesity  Renal/GU negative Renal ROS     Musculoskeletal   Abdominal (+) + obese,   Peds  Hematology  (+) [facility] not [facility] blood and needs to speak to minister regarding Albumin.   Anesthesia Other Findings Breast cancer  Reproductive/Obstetrics                           Anesthesia Physical Anesthesia Plan  ASA: III  Anesthesia Plan: General   Post-op Pain Management:    Induction: Intravenous  Airway Management Planned: Oral ETT  Additional Equipment:   Intra-op Plan:   Post-operative Plan: Extubation in OR  Informed Consent: I have reviewed the patients History and Physical, chart, labs and discussed the procedure including the risks, benefits and alternatives for the proposed anesthesia with the patient or authorized representative who has indicated his/her understanding and acceptance.   Dental advisory given  Plan Discussed with: CRNA and Surgeon  Anesthesia Plan Comments: (Plan routine monitors, GETA)       Anesthesia Quick Evaluation

## 2014-12-03 NOTE — Anesthesia Procedure Notes (Signed)
Procedure Name: Intubation Date/Time: 12/03/2014 3:54 PM Performed by: Izora Gala Pre-anesthesia Checklist: Patient identified, Emergency Drugs available, Suction available and Patient being monitored Patient Re-evaluated:Patient Re-evaluated prior to inductionOxygen Delivery Method: Circle system utilized Preoxygenation: Pre-oxygenation with 100% oxygen Intubation Type: IV induction Ventilation: Mask ventilation without difficulty Laryngoscope Size: Miller and 3 Grade View: Grade I Tube type: Oral Tube size: 7.5 mm Number of attempts: 1 Airway Equipment and Method: Stylet and LTA kit utilized Placement Confirmation: ETT inserted through vocal cords under direct vision,  positive ETCO2 and breath sounds checked- equal and bilateral Secured at: 23 cm Tube secured with: Tape Dental Injury: Teeth and Oropharynx as per pre-operative assessment

## 2014-12-03 NOTE — Progress Notes (Signed)
Occupational Therapy Treatment Patient Details Name: Karen [name] MRN: [id number] DOB: 1945/08/20 Today's Date: 12/03/2014    History of present illness Patient is 10 weeks s/p lumbar decompression and insertion of coflex device for spinal stenosis. Post-op course complicated by wound dehisence 3-4 weeks after surgery. Patient taken back to the OR for I+D of the wound and primary closure. At that time there was no gross contamination of prulent drainage. Patient was doing well for 2 weeks and stopped oral ABX on 11/21/14. Presents to my office today with 2-3 day history of drainage. As a resultgross contamination and drainage I elected to admit her and move forward with formal wash-out and vac dressing application   OT comments  Pt making progress with functional goals and should continue with acute OT services to address impairments to increase level of function and safety  Follow Up Recommendations  No OT follow up    Equipment Recommendations  None recommended by OT    Recommendations for Other Services      Precautions / Restrictions Precautions Precautions: Back Precaution Comments: pt able to recall from previous surgeries Restrictions Weight Bearing Restrictions: No       Mobility Bed Mobility Overal bed mobility: Modified Independent                Transfers Overall transfer level: Needs assistance Equipment used: Rolling walker (2 wheeled) Transfers: Sit to/from Stand Sit to Stand: Supervision         General transfer comment: assist for line management    Balance   Sitting-balance support: No upper extremity supported;Feet supported Sitting balance-Leahy Scale: Good     Standing balance support: During functional activity Standing balance-Leahy Scale: Fair                     ADL       Grooming: Wash/dry hands;Wash/dry face;Standing;Set up       Lower Body Bathing: Minimal assistance;Sit to/from stand       Lower Body  Dressing: Minimal assistance;Sit to/from stand       Toileting- Water quality scientist and Hygiene: Sit to/from stand;Supervision/safety   Tub/ Shower Transfer: Grab bars;Supervision/safety;Rolling walker   Functional mobility during ADLs: Min guard;Supervision/safety        Vision  no change from baseline                   Agricultural engineer Tested?: No   Praxis Praxis Praxis tested?: Not tested    Cognition   Behavior During Therapy: WFL for tasks assessed/performed Overall Cognitive Status: Within Functional Limits for tasks assessed                       Extremity/Trunk Assessment   generalized weakness                        General Comments  pt pleasant and cooperative    Pertinent Vitals/ Pain       Pain Assessment: 0-10 Pain Score: 4  Pain Location: back Pain Descriptors / Indicators: Aching;Sore Pain Intervention(s): Monitored during session  Home Living  home with husband                                        Prior Functioning/Environment  independent           Frequency Min 2X/week  Progress Toward Goals  OT Goals(current goals can now be found in the care plan section)  Progress towards OT goals: Progressing toward goals  Acute Rehab OT Goals Patient Stated Goal: go home  Plan Discharge plan remains appropriate                     End of Session Equipment Utilized During Treatment: Gait belt;Rolling walker   Activity Tolerance Patient tolerated treatment well   Patient Left with call bell/phone within reach;in bed             Time: 1137-1155 OT Time Calculation (min): 18 min  Charges: OT General Charges $OT Visit: 1 Procedure OT Treatments $Self Care/Home Management : 8-22 mins  Britt Bottom 12/03/2014, 12:32 PM

## 2014-12-03 NOTE — Progress Notes (Signed)
    Subjective: Procedure(s) (LRB): WOUND VAC DRESSING CHANGE  (N/A) Day of Surgery  Patient reports pain as 1 on 0-10 scale.  Reports deferred at this time leg pain denies incisional back pain   Positive void Negative bowel movement Positive flatus Negative chest pain or shortness of breath  Objective: Vital signs in last 24 hours: Temp:  [97.7 F (36.5 C)-100 F (37.8 C)] 98.9 F (37.2 C) (06/22 1438) Pulse Rate:  [76-95] 76 (06/22 1438) Resp:  [14-18] 14 (06/22 1438) BP: (109-130)/(39-54) 130/53 mmHg (06/22 1438) SpO2:  [80 %-94 %] 94 % (06/22 1438)  Intake/Output from previous day: 06/21 0701 - 06/22 0700 In: 2184.2 [I.V.:1884.2; IV Piggyback:300] Out: 50 [Drains:50]  Labs:  Recent Labs  12/01/14 2155 12/02/14 0726  WBC 11.1* 9.5  RBC 3.08* 3.02*  HCT 28.6* 28.0*  PLT 227 242    Recent Labs  12/01/14 2155 12/02/14 0726  NA 131* 133*  K 3.0* 3.1*  CL 94* 95*  CO2 29 32  BUN 17 14  CREATININE 1.10* 0.96  GLUCOSE 108* 112*  CALCIUM 9.1 8.8*   No results for input(s): LABPT, INR in the last 72 hours.  Physical Exam: Neurologically intact ABD soft Intact pulses distally Compartment soft Vac functioning well  Assessment/Plan: Patient stable  xrays n/a Continue mobilization with physical therapy Continue care  Patient improving since wash out.  Will plan on return to OR today for repeat wash out and VAC dressing change.  Melina Schools, MD [place] [phone]

## 2014-12-03 NOTE — Progress Notes (Signed)
Pt returned from PACU. VSS. Pt a&ox4. Wound vac in place. Pt tolerating well. Diet ordered. No issues at this time. Will continue to monitor pt closely. Leanne Chang, RN

## 2014-12-03 NOTE — Progress Notes (Signed)
[facility] for Infectious Disease  Date of Admission:  12/01/2014  Antibiotics: vancomycin  Subjective: Ready for surgery  Objective: Temp:  [97.7 F (36.5 C)-100 F (37.8 C)] 100 F (37.8 C) (06/22 0610) Pulse Rate:  [65-95] 81 (06/22 0610) Resp:  [14-18] 14 (06/22 0610) BP: (109-127)/(39-54) 127/54 mmHg (06/22 0610) SpO2:  [80 %-95 %] 80 % (06/22 0610)  General: awake, alert, nad Skin: no rashes Lungs: CTA B Cor: RRR   Lab Results Lab Results  Component Value Date   WBC 9.5 12/02/2014   HGB 9.2* 12/02/2014   HCT 28.0* 12/02/2014   MCV 92.7 12/02/2014   PLT 242 12/02/2014    Lab Results  Component Value Date   CREATININE 0.96 12/02/2014   BUN 14 12/02/2014   NA 133* 12/02/2014   K 3.1* 12/02/2014   CL 95* 12/02/2014   CO2 32 12/02/2014    Lab Results  Component Value Date   ALT 16 03/19/2013   AST 18 03/19/2013   ALKPHOS 112 03/19/2013   BILITOT 0.3 03/19/2013      Microbiology: Recent Results (from the past 240 hour(s))  Surgical pcr screen     Status: None   Collection Time: 12/01/14  7:53 PM  Result Value Ref Range Status   MRSA, PCR NEGATIVE NEGATIVE Final   Staphylococcus aureus NEGATIVE NEGATIVE Final    Comment:        The Xpert SA Assay (FDA approved for NASAL specimens in patients over 70 years of age), is one component of a comprehensive surveillance program.  Test performance has been validated by [facility] for patients greater than or equal to 55 year old. It is not intended to diagnose infection nor to guide or monitor treatment.   Anaerobic culture     Status: None (Preliminary result)   Collection Time: 12/01/14  9:31 PM  Result Value Ref Range Status   Specimen Description WOUND  Final   Special Requests LUMBAR  Final   Gram Stain   Final    ABUNDANT WBC PRESENT,BOTH PMN AND MONONUCLEAR NO SQUAMOUS EPITHELIAL CELLS SEEN RARE GRAM POSITIVE COCCI IN CLUSTERS Performed at [facility] Performed at [facility]    Culture   Final    NO ANAEROBES ISOLATED; CULTURE IN PROGRESS FOR 5 DAYS Performed at [facility]    Report Status PENDING  Incomplete  Wound culture     Status: None (Preliminary result)   Collection Time: 12/01/14  9:31 PM  Result Value Ref Range Status   Specimen Description WOUND  Final   Special Requests LUMBAR  Final   Gram Stain   Final    ABUNDANT WBC PRESENT,BOTH PMN AND MONONUCLEAR NO SQUAMOUS EPITHELIAL CELLS SEEN RARE GRAM POSITIVE COCCI IN PAIRS IN CLUSTERS Gram Stain Report Called to,Read Back By and Verified With: Gram Stain Report Called to,Read Back By and Verified With: V.BROWN AT 2232 ON [id number] BY A.BROWNING Performed at [facility] Performed at [facility]    Culture   Final    MODERATE STAPHYLOCOCCUS AUREUS Note: RIFAMPIN AND GENTAMICIN SHOULD NOT BE USED AS SINGLE DRUGS FOR TREATMENT OF STAPH INFECTIONS. Performed at [facility]    Report Status PENDING  Incomplete  Gram stain     Status: None   Collection Time: 12/01/14  9:31 PM  Result Value Ref Range Status   Specimen Description WOUND  Final   Special Requests LUMBAR  Final   Gram Stain  Final    ABUNDANT WBC PRESENT,BOTH PMN AND MONONUCLEAR RARE GRAM POSITIVE COCCI IN CLUSTERS Gram Stain Report Called to,Read Back By and Verified With: V BROWN 2232 12/01/14 A BROWNING    Report Status 12/01/2014 FINAL  Final  Fungus Culture with Smear     Status: None (Preliminary result)   Collection Time: 12/01/14  9:31 PM  Result Value Ref Range Status   Specimen Description WOUND  Final   Special Requests LUMBAR  Final   Fungal Smear   Final    NO YEAST OR FUNGAL ELEMENTS SEEN Performed at [facility]    Culture   Final    CULTURE IN PROGRESS FOR FOUR WEEKS Performed at [facility]    Report Status PENDING  Incomplete  Urine culture     Status: None   Collection Time: 12/01/14  9:44 PM  Result Value Ref Range Status   Specimen  Description URINE, RANDOM  Final   Special Requests IN/OUT CATH URINE  Final   Culture NO GROWTH 1 DAY  Final   Report Status 12/03/2014 FINAL  Final    Studies/Results: Dg Lumbar Spine 2-3 Views  12/01/2014   CLINICAL DATA:  Infection and surgical site.  EXAM: LUMBAR SPINE - 2-3 VIEW  COMPARISON:  09/25/2014  FINDINGS: Posterior hardware noted at the L3-4 and L4-5 levels. Appearance is stable since prior study. 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 No fracture. No acute bony abnormality.  IMPRESSION: Postoperative changes in the lower lumbar spine. Degenerative disc and facet disease. No acute findings.   Electronically Signed   By: Rolm Baptise M.D.   On: 12/01/2014 18:04    Assessment/Plan:  1) post op wound infection following L3-4, L4-5 lumbar decompression with purulence superficial to fascia - growing Staph aureus.  On vancomycin.  Will narrow to cefazolin if MSSA or continue with vancomycin if MRSA.  Will do 4-6 weeks.  Could consider linezolid after 3-4 weeks.    Scharlene Gloss, [place] for Infectious Disease [place] [email or web address] [phone] pager   [phone] cell 12/03/2014, 10:06 AM

## 2014-12-03 NOTE — Brief Op Note (Signed)
12/01/2014 - 12/03/2014  4:33 PM  PATIENT:  Karen [name]  69 y.o. female  PRE-OPERATIVE DIAGNOSIS:  POST OP WOUND INFECTION LUMBAR SPINE  POST-OPERATIVE DIAGNOSIS:  POST OP WOUND INFECTION LUMBAR SPINE  PROCEDURE:  Procedure(s): WOUND VAC DRESSING CHANGE  (N/A)  SURGEON:  Surgeon(s) and Role:    * Melina Schools, MD - Primary  PHYSICIAN ASSISTANT:   ASSISTANTS: Carmen Mayo   ANESTHESIA:   general  EBL:  Total I/O In: 325.8 [I.V.:325.8] Out: -   BLOOD ADMINISTERED:none  DRAINS: none   LOCAL MEDICATIONS USED:  NONE  SPECIMEN:  No Specimen  DISPOSITION OF SPECIMEN:  N/A  COUNTS:  YES  TOURNIQUET:  * No tourniquets in log *  DICTATION: .Other Dictation: Dictation Number [id number]  PLAN OF CARE: Admit to inpatient   PATIENT DISPOSITION:  PACU - hemodynamically stable.

## 2014-12-03 NOTE — Transfer of Care (Signed)
Immediate Anesthesia Transfer of Care Note  Patient: Karen [name]  Procedure(s) Performed: Procedure(s): WOUND VAC DRESSING CHANGE  (N/A)  Patient Location: PACU  Anesthesia Type:General  Level of Consciousness: awake and alert   Airway & Oxygen Therapy: Patient Spontanous Breathing and Patient connected to nasal cannula oxygen  Post-op Assessment: Report given to RN, Post -op Vital signs reviewed and stable and Patient moving all extremities  Post vital signs: Reviewed and stable  Last Vitals:  Filed Vitals:   12/03/14 1438  BP: 130/53  Pulse: 76  Temp: 37.2 C  Resp: 14    Complications: No apparent anesthesia complications

## 2014-12-04 ENCOUNTER — Encounter (HOSPITAL_COMMUNITY): Payer: Self-pay | Admitting: Orthopedic Surgery

## 2014-12-04 LAB — WOUND CULTURE

## 2014-12-04 MED ORDER — CEFAZOLIN SODIUM-DEXTROSE 2-3 GM-% IV SOLR
2.0000 g | Freq: Three times a day (TID) | INTRAVENOUS | Status: DC
  Administered 2014-12-04 – 2014-12-09 (×16): 2 g via INTRAVENOUS
  Filled 2014-12-04 (×20): qty 50

## 2014-12-04 MED ORDER — VANCOMYCIN HCL 10 G IV SOLR
1250.0000 mg | INTRAVENOUS | Status: DC
  Filled 2014-12-04: qty 1250

## 2014-12-04 NOTE — Progress Notes (Signed)
Physical Therapy Treatment Patient Details Name: Karen [name] MRN: [id number] DOB: December 15, 1945 Today's Date: 12/04/2014    History of Present Illness Repeat washout of the spine wound and VAC dressing due to wound infection s/p back surgery    PT Comments    Pt is making good progress w/ therapy.  Did not recall no twisting and all back precautions reviewed w/ pt.  VCs to stand upright and keep RW closer to her body during ambulation, improving body mechanics.  Pt able to demonstrate log roll w/o assist.  Patient needs to practice stairs next session.  Pt will benefit from continued skilled PT services to increase functional independence and safety.   Follow Up Recommendations  Home health PT;Supervision/Assistance - 24 hour     Equipment Recommendations  None recommended by PT    Recommendations for Other Services       Precautions / Restrictions Precautions Precautions: Back Precaution Comments: Pt unable to recall no twisting, reviewed all back precautions w/ pt Restrictions Weight Bearing Restrictions: No    Mobility  Bed Mobility Overal bed mobility: Modified Independent Bed Mobility: Rolling;Sit to Sidelying Rolling: Modified independent (Device/Increase time)       Sit to sidelying: Modified independent (Device/Increase time) General bed mobility comments: Good log roll technique w/ use of bed rails  Transfers Overall transfer level: Needs assistance Equipment used: Rolling walker (2 wheeled) Transfers: Sit to/from Stand Sit to Stand: Supervision         General transfer comment: assist for line management  Ambulation/Gait Ambulation/Gait assistance: Min guard Ambulation Distance (Feet): 300 Feet Assistive device: Rolling walker (2 wheeled) Gait Pattern/deviations: Step-through pattern;Antalgic;Trunk flexed Gait velocity: slow Gait velocity interpretation: Below normal speed for age/gender General Gait Details: pt with R antalgic limp, increased  reliance on RW.  Cues to stand upright and keep RW closer to body, decreasing amount of WB through [place].   Stairs            Wheelchair Mobility    Modified Rankin (Stroke Patients Only)       Balance Overall balance assessment: Needs assistance Sitting-balance support: No upper extremity supported;Feet supported Sitting balance-Leahy Scale: Good     Standing balance support: No upper extremity supported;During functional activity Standing balance-Leahy Scale: Fair Standing balance comment: pt able to stand at sink and wash hands w/o either UE supported                    Cognition Arousal/Alertness: Awake/alert Behavior During Therapy: WFL for tasks assessed/performed Overall Cognitive Status: Within Functional Limits for tasks assessed       Memory: Decreased recall of precautions              Exercises General Exercises - Lower Extremity Hip Flexion/Marching: AROM;Both;10 reps;Standing    General Comments        Pertinent Vitals/Pain Pain Assessment: 0-10 Pain Score: 3  Pain Location: back Pain Descriptors / Indicators: Sore Pain Intervention(s): Repositioned;Monitored during session    Home Living                      Prior Function            PT Goals (current goals can now be found in the care plan section) Acute Rehab PT Goals Patient Stated Goal: Pt says she is now planning to go SNF prior to home PT Goal Formulation: With patient Time For Goal Achievement: 12/09/14 Potential to Achieve Goals: Good Progress towards PT goals:  Progressing toward goals    Frequency  Min 3X/week    PT Plan Current plan remains appropriate    Co-evaluation             End of Session   Activity Tolerance: Patient tolerated treatment well Patient left: in bed;with call bell/phone within reach     Time: 2778-2423 PT Time Calculation (min) (ACUTE ONLY): 19 min  Charges:  $Gait Training: 8-22 mins                    G Codes:       Joslyn Hy PT, [place] [phone] Pager: [phone] 12/04/2014, 2:07 PM

## 2014-12-04 NOTE — Progress Notes (Signed)
Occupational Therapy Treatment Patient Details Name: Karen [name] MRN: [id number] DOB: 1946-02-20 Today's Date: 12/04/2014    History of present illness Repeat washout of the spine wound and VAC dressing due to wound infection s/p back surgery   OT comments  Pt making progress with functional goals an now planning to d/c to SNF for wound care and therapy before returning home with her husband  Follow Up Recommendations   (now planning for a SNF d/c)    Equipment Recommendations  None recommended by OT    Recommendations for Other Services      Precautions / Restrictions  back, able ot recall back precautions      Mobility Bed Mobility Overal bed mobility: Modified Independent                Transfers Overall transfer level: Needs assistance Equipment used: Rolling walker (2 wheeled) Transfers: Sit to/from Stand Sit to Stand: Supervision         General transfer comment: assist for line management    Balance Overall balance assessment: Needs assistance Sitting-balance support: No upper extremity supported;Feet supported Sitting balance-Leahy Scale: Good     Standing balance support: No upper extremity supported;During functional activity Standing balance-Leahy Scale: Fair                     ADL       Grooming: Wash/dry hands;Wash/dry face;Standing;Set up       Lower Body Bathing: Minimal assistance;Sit to/from stand;Min guard       Lower Body Dressing: Minimal assistance;Sit to/from stand;Min guard   Toilet Transfer: Regular Toilet;Grab bars;RW;Supervision/safety [employer] forces technical officer Details (indicate cue type and reason): assist with lines, wound vac Toileting- Clothing Manipulation and Hygiene: Sit to/from stand;Supervision/safety   Tub/ Shower Transfer: Grab bars;Supervision/safety;Rolling walker;3 in 1   Functional mobility during ADLs: Min guard;Supervision/safety        Vision  no change from baseline                    Perception  N/T   Praxis Praxis Praxis tested?: Not tested    Cognition   Behavior During Therapy: WFL for tasks assessed/performed Overall Cognitive Status: Within Functional Limits for tasks assessed                       Extremity/Trunk Assessment   generalized weakness                        General Comments  pt pleasant and cooperative    Pertinent Vitals/ Pain       Pain Assessment: 0-10 Pain Score: 3  Pain Location: back Pain Descriptors / Indicators: Sore Pain Intervention(s): Repositioned;Monitored during session  Home Living  with husband                                        Prior Functioning/Environment  independent           Frequency Min 2X/week     Progress Toward Goals  OT Goals(current goals can now be found in the care plan section)  Progress towards OT goals: Progressing toward goals     Plan Discharge plan needs to be updated                     End of Session Equipment Utilized During  Treatment: Rolling walker (3 in 1)   Activity Tolerance Patient tolerated treatment well   Patient Left with call bell/phone within reach;in chair             Time: 0981-1914 OT Time Calculation (min): 23 min  Charges: OT General Charges $OT Visit: 1 Procedure OT Treatments $Self Care/Home Management : 8-22 mins $Therapeutic Activity: 8-22 mins  Britt Bottom 12/04/2014, 2:06 PM

## 2014-12-04 NOTE — Progress Notes (Signed)
ANTIBIOTIC CONSULT NOTE - INITIAL  Pharmacy Consult for Ancef Indication: wound infection   Allergies  Allergen Reactions  . Sulfonamide Derivatives Anaphylaxis  . Morphine And Related Swelling    Patient Measurements: Height: 5\' 3"  (160 cm) Weight: 173 lb 1 oz (78.5 kg) IBW/kg (Calculated) : 52.4 Adjusted Body Weight:    Vital Signs: Temp: 98.7 F (37.1 C) (06/23 1401) Temp Source: Oral (06/23 1401) BP: 99/33 mmHg (06/23 1401) Pulse Rate: 75 (06/23 1401) Intake/Output from previous day: 06/22 0701 - 06/23 0700 In: 2660.8 [I.V.:2660.8] Out: 675 [Urine:500; Drains:175] Intake/Output from this shift: Total I/O In: 300 [P.O.:300] Out: 450 [Urine:450]  Labs:  Recent Labs  12/01/14 2105 12/01/14 2155 12/02/14 0726  WBC  --  11.1* 9.5  HGB 10.9* 9.6* 9.2*  PLT  --  227 242  CREATININE  --  1.10* 0.96   Estimated Creatinine Clearance: 54.8 mL/min (by C-G formula based on Cr of 0.96). No results for input(s): VANCOTROUGH, VANCOPEAK, VANCORANDOM, GENTTROUGH, GENTPEAK, GENTRANDOM, TOBRATROUGH, TOBRAPEAK, TOBRARND, AMIKACINPEAK, AMIKACINTROU, AMIKACIN in the last 72 hours.   Microbiology: Recent Results (from the past 720 hour(s))  Surgical pcr screen     Status: None   Collection Time: 11/07/14  6:50 PM  Result Value Ref Range Status   MRSA, PCR NEGATIVE NEGATIVE Final   Staphylococcus aureus NEGATIVE NEGATIVE Final    Comment:        The Xpert SA Assay (FDA approved for NASAL specimens in patients over 85 years of age), is one component of a comprehensive surveillance program.  Test performance has been validated by [facility] for patients greater than or equal to 104 year old. It is not intended to diagnose infection nor to guide or monitor treatment.   Anaerobic culture     Status: None   Collection Time: 11/07/14  7:33 PM  Result Value Ref Range Status   Specimen Description WOUND  Final   Special Requests DEEP BACK WOUND  Final   Gram Stain   Final     RARE WBC PRESENT,BOTH PMN AND MONONUCLEAR NO SQUAMOUS EPITHELIAL CELLS SEEN NO ORGANISMS SEEN Performed at [facility] Performed at [facility]    Culture   Final    NO ANAEROBES ISOLATED Performed at [facility]    Report Status 11/12/2014 FINAL  Final  Fungus Culture with Smear     Status: None (Preliminary result)   Collection Time: 11/07/14  7:33 PM  Result Value Ref Range Status   Specimen Description WOUND  Final   Special Requests DEEP BACK WOUND  Final   Fungal Smear   Final    NO YEAST OR FUNGAL ELEMENTS SEEN Performed at [facility]    Culture   Final    CULTURE IN PROGRESS FOR FOUR WEEKS Performed at [facility]    Report Status PENDING  Incomplete  Gram stain     Status: None   Collection Time: 11/07/14  7:33 PM  Result Value Ref Range Status   Specimen Description WOUND  Final   Special Requests DEEP BACK WOUND  Final   Gram Stain   Final    RARE WBC PRESENT,BOTH PMN AND MONONUCLEAR NO ORGANISMS SEEN    Report Status 11/07/2014 FINAL  Final  Wound culture     Status: None   Collection Time: 11/07/14  7:33 PM  Result Value Ref Range Status   Specimen Description WOUND  Final   Special Requests DEEP BACK WOUND  Final  Gram Stain   Final    RARE WBC PRESENT,BOTH PMN AND MONONUCLEAR NO SQUAMOUS EPITHELIAL CELLS SEEN NO ORGANISMS SEEN Performed at [facility] Performed at [facility]    Culture   Final    NO GROWTH 2 DAYS Performed at [facility]    Report Status 11/10/2014 FINAL  Final  Surgical pcr screen     Status: None   Collection Time: 12/01/14  7:53 PM  Result Value Ref Range Status   MRSA, PCR NEGATIVE NEGATIVE Final   Staphylococcus aureus NEGATIVE NEGATIVE Final    Comment:        The Xpert SA Assay (FDA approved for NASAL specimens in patients over 61 years of age), is one component of a comprehensive surveillance program.  Test performance has been validated  by [facility] for patients greater than or equal to 107 year old. It is not intended to diagnose infection nor to guide or monitor treatment.   Anaerobic culture     Status: None (Preliminary result)   Collection Time: 12/01/14  9:31 PM  Result Value Ref Range Status   Specimen Description WOUND  Final   Special Requests LUMBAR  Final   Gram Stain   Final    ABUNDANT WBC PRESENT,BOTH PMN AND MONONUCLEAR NO SQUAMOUS EPITHELIAL CELLS SEEN RARE GRAM POSITIVE COCCI IN CLUSTERS Performed at [facility] Performed at [facility]    Culture   Final    NO ANAEROBES ISOLATED; CULTURE IN PROGRESS FOR 5 DAYS Performed at [facility]    Report Status PENDING  Incomplete  Wound culture     Status: None   Collection Time: 12/01/14  9:31 PM  Result Value Ref Range Status   Specimen Description WOUND  Final   Special Requests LUMBAR  Final   Gram Stain   Final    ABUNDANT WBC PRESENT,BOTH PMN AND MONONUCLEAR NO SQUAMOUS EPITHELIAL CELLS SEEN RARE GRAM POSITIVE COCCI IN PAIRS IN CLUSTERS Gram Stain Report Called to,Read Back By and Verified With: Gram Stain Report Called to,Read Back By and Verified With: V.BROWN AT 2232 ON [id number] BY A.BROWNING Performed at [facility] Performed at [facility]    Culture   Final    MODERATE STAPHYLOCOCCUS AUREUS Note: RIFAMPIN AND GENTAMICIN SHOULD NOT BE USED AS SINGLE DRUGS FOR TREATMENT OF STAPH INFECTIONS. This organism DOES NOT demonstrate inducible Clindamycin resistance in vitro. Performed at [facility]    Report Status 12/04/2014 FINAL  Final   Organism ID, Bacteria STAPHYLOCOCCUS AUREUS  Final      Susceptibility   Staphylococcus aureus - MIC*    CLINDAMYCIN <=0.25 SENSITIVE Sensitive     ERYTHROMYCIN >=8 RESISTANT Resistant     GENTAMICIN <=0.5 SENSITIVE Sensitive     LEVOFLOXACIN <=0.12 SENSITIVE Sensitive     OXACILLIN 0.5 SENSITIVE Sensitive     PENICILLIN >=0.5 RESISTANT Resistant      RIFAMPIN <=0.5 SENSITIVE Sensitive     TRIMETH/SULFA <=10 SENSITIVE Sensitive     VANCOMYCIN 1 SENSITIVE Sensitive     TETRACYCLINE <=1 SENSITIVE Sensitive     MOXIFLOXACIN <=0.25 SENSITIVE Sensitive     * MODERATE STAPHYLOCOCCUS AUREUS  Gram stain     Status: None   Collection Time: 12/01/14  9:31 PM  Result Value Ref Range Status   Specimen Description WOUND  Final   Special Requests LUMBAR  Final   Gram Stain   Final    ABUNDANT WBC PRESENT,BOTH PMN AND  MONONUCLEAR RARE GRAM POSITIVE COCCI IN CLUSTERS Gram Stain Report Called to,Read Back By and Verified With: V BROWN 2232 12/01/14 A BROWNING    Report Status 12/01/2014 FINAL  Final  Fungus Culture with Smear     Status: None (Preliminary result)   Collection Time: 12/01/14  9:31 PM  Result Value Ref Range Status   Specimen Description WOUND  Final   Special Requests LUMBAR  Final   Fungal Smear   Final    NO YEAST OR FUNGAL ELEMENTS SEEN Performed at [facility]    Culture   Final    CULTURE IN PROGRESS FOR FOUR WEEKS Performed at [facility]    Report Status PENDING  Incomplete  Urine culture     Status: None   Collection Time: 12/01/14  9:44 PM  Result Value Ref Range Status   Specimen Description URINE, RANDOM  Final   Special Requests IN/OUT CATH URINE  Final   Culture NO GROWTH 1 DAY  Final   Report Status 12/03/2014 FINAL  Final    Medical History: Past Medical History  Diagnosis Date  . HTN (hypertension)   . Goiter   . Hypothyroidism   . Tubular adenoma   . Diverticulitis   . GERD (gastroesophageal reflux disease)     since gallbladder removed, no more GERD  . Sleep apnea     has never been tested.  was dx with cancer first and had this problem on the back burner  . Breast cancer 2004    bilateral mastectomy-all left lymph nodes removed; chemo. Patient states she had "spot" on the right breast as well.    Assessment: 69 year old female 10 weeks s/p lumbar decompression and  insertion of device for spinal stenosis. Post-op course complicated by wound dehisence 3 to 4 weeks after surgery. Patient taken to the OR for I +D and was placed on oral antibiotics until 6/10. She now returns with a 2 to 3 days history of drainage from her wound   Infectious Disease: Cellulitis/lumbar wound, Tmax = 100.4. WBC 9.5 down. OR for wound washings and VAC changes   Ancef 6/23>> Vancomycin 6/21> 6/23  Wound Cx > MSSA    Goal of Therapy:  Treatment of MSSA  Plan:  ancef 2g IV q8h  Jonquil Stubbe S. Alford Highland, PharmD, BCPS Clinical Staff Pharmacist Pager [phone]   Eilene Ghazi Stillinger 12/04/2014,2:10 PM

## 2014-12-04 NOTE — Progress Notes (Signed)
    Subjective: Procedure(s) (LRB): WOUND VAC DRESSING CHANGE  (N/A) 1 Day Post-Op  Patient reports pain as 3 on 0-10 scale.  Reports none leg pain reports incisional back pain   Positive void Negative bowel movement Positive flatus Negative chest pain or shortness of breath  Objective: Vital signs in last 24 hours: Temp:  [98.2 F (36.8 C)-100.4 F (38 C)] 98.7 F (37.1 C) (06/23 1401) Pulse Rate:  [75-104] 75 (06/23 1401) Resp:  [16-24] 20 (06/23 1401) BP: (99-179)/(33-88) 99/33 mmHg (06/23 1401) SpO2:  [92 %-98 %] 95 % (06/23 1401)  Intake/Output from previous day: 06/22 0701 - 06/23 0700 In: 2660.8 [I.V.:2660.8] Out: 675 [Urine:500; Drains:175]  Labs:  Recent Labs  12/01/14 2155 12/02/14 0726  WBC 11.1* 9.5  RBC 3.08* 3.02*  HCT 28.6* 28.0*  PLT 227 242    Recent Labs  12/01/14 2155 12/02/14 0726  NA 131* 133*  K 3.0* 3.1*  CL 94* 95*  CO2 29 32  BUN 17 14  CREATININE 1.10* 0.96  GLUCOSE 108* 112*  CALCIUM 9.1 8.8*   No results for input(s): LABPT, INR in the last 72 hours.  Physical Exam: Neurologically intact ABD soft Intact pulses distally Compartment soft  Assessment/Plan: Patient stable  xrays n/a Continue mobilization with physical therapy Continue care  Advance diet  Continue to encourage ambulation Plan on VAC dressing change in AM - will medicate with dilaudid   Melina Schools, MD [place] [phone]

## 2014-12-04 NOTE — Op Note (Signed)
Karen [name], KIGER                 ACCOUNT NO.:  [id number]  MEDICAL RECORD NO.:  [id number]  LOCATION:  [id number]                        FACILITY:  [facility]  PHYSICIAN:  Kinisha Soper D. Rolena Infante, M.D. DATE OF BIRTH:  1946-03-15  DATE OF PROCEDURE:  12/03/2014 DATE OF DISCHARGE:                              OPERATIVE REPORT   PREOPERATIVE DIAGNOSIS:  Wound infection.  POSTOPERATIVE DIAGNOSIS:  Wound infection.  OPERATIVE PROCEDURE:  Repeat washout of the spine wound and VAC dressing change.  COMPLICATIONS:  None.  CONDITION:  Stable.  HISTORY:  This is a very pleasant woman who had a posterior lumbar decompression and CoFlex insertion and was doing well until she had a slight wound dehiscence from a scab that was removed.  She had the wound revised and then returned 3 weeks later with a frank wound infection with purulent material.  On Monday, she was taken to the operating room for washout and insertion of the VAC, she did well.  Today, she is draining and she no longer feels "toxic."  She has no fevers and she is doing quite well.  We elected to take her back to the operating room to change the [facility] and at this time, install and irrigating VAC dressing and wash her out again.  All appropriate risks, benefits, alternatives were discussed with the patient.  Consent was obtained.  OPERATIVE NOTE:  The patient was brought to the operating room and placed supine on the operating table.  After successful induction of general anesthesia and endotracheal intubation, TEDs and SCDs were applied.  She was turned prone onto a chest and pelvic roll.  The VAC was removed and the wound was exposed.  There was no purulent drainage. There was actually nice healthy bleeding, granulation tissue.  At this point, the back was prepped and draped and time-out was taken.  Once we confirmed patient, procedure and all other important pertinent data, I then pulse lavaged the wound with 3 liters of normal saline.   I then placed the new irrigating VAC dressing into the wound and then sealed it.  I then hooked the device to the machine, it was functioning fine, there was no leak.  The patient was extubated, transferred to the PACU without incident.  At the end of the case, all needle and sponge counts were correct.  First assistant was [employer], my PA.    Greogry Goodwyn D. Rolena Infante, M.D.    DDB/MEDQ  D:  12/03/2014  T:  12/04/2014  Job:  [id number]

## 2014-12-04 NOTE — Progress Notes (Signed)
Karen [name] for Infectious Disease  Date of Admission:  12/01/2014  Antibiotics: cefazolin  Subjective: No new events  Objective: Temp:  [98.2 F (36.8 C)-100.4 F (38 C)] 98.7 F (37.1 C) (06/23 1401) Pulse Rate:  [75-104] 75 (06/23 1401) Resp:  [16-24] 20 (06/23 1401) BP: (99-179)/(33-88) 99/33 mmHg (06/23 1401) SpO2:  [92 %-98 %] 95 % (06/23 1401)  General: awake, alert, nad Skin: no rashes Lungs: CTA B Cor: RRR   Lab Results Lab Results  Component Value Date   WBC 9.5 12/02/2014   HGB 9.2* 12/02/2014   HCT 28.0* 12/02/2014   MCV 92.7 12/02/2014   PLT 242 12/02/2014    Lab Results  Component Value Date   CREATININE 0.96 12/02/2014   BUN 14 12/02/2014   NA 133* 12/02/2014   K 3.1* 12/02/2014   CL 95* 12/02/2014   CO2 32 12/02/2014    Lab Results  Component Value Date   ALT 16 03/19/2013   AST 18 03/19/2013   ALKPHOS 112 03/19/2013   BILITOT 0.3 03/19/2013      Microbiology: Recent Results (from the past 240 hour(s))  Surgical pcr screen     Status: None   Collection Time: 12/01/14  7:53 PM  Result Value Ref Range Status   MRSA, PCR NEGATIVE NEGATIVE Final   Staphylococcus aureus NEGATIVE NEGATIVE Final    Comment:        The Xpert SA Assay (FDA approved for NASAL specimens in patients over 63 years of age), is one component of a comprehensive surveillance program.  Test performance has been validated by [facility] for patients greater than or equal to 51 year old. It is not intended to diagnose infection nor to guide or monitor treatment.   Anaerobic culture     Status: None (Preliminary result)   Collection Time: 12/01/14  9:31 PM  Result Value Ref Range Status   Specimen Description WOUND  Final   Special Requests LUMBAR  Final   Gram Stain   Final    ABUNDANT WBC PRESENT,BOTH PMN AND MONONUCLEAR NO SQUAMOUS EPITHELIAL CELLS SEEN RARE GRAM POSITIVE COCCI IN CLUSTERS Performed at [facility] Performed at [facility]    Culture   Final    NO ANAEROBES ISOLATED; CULTURE IN PROGRESS FOR 5 DAYS Performed at [facility]    Report Status PENDING  Incomplete  Wound culture     Status: None   Collection Time: 12/01/14  9:31 PM  Result Value Ref Range Status   Specimen Description WOUND  Final   Special Requests LUMBAR  Final   Gram Stain   Final    ABUNDANT WBC PRESENT,BOTH PMN AND MONONUCLEAR NO SQUAMOUS EPITHELIAL CELLS SEEN RARE GRAM POSITIVE COCCI IN PAIRS IN CLUSTERS Gram Stain Report Called to,Read Back By and Verified With: Gram Stain Report Called to,Read Back By and Verified With: V.BROWN AT 2232 ON [id number] BY A.BROWNING Performed at [facility] Performed at [facility]    Culture   Final    MODERATE STAPHYLOCOCCUS AUREUS Note: RIFAMPIN AND GENTAMICIN SHOULD NOT BE USED AS SINGLE DRUGS FOR TREATMENT OF STAPH INFECTIONS. This organism DOES NOT demonstrate inducible Clindamycin resistance in vitro. Performed at [facility]    Report Status 12/04/2014 FINAL  Final   Organism ID, Bacteria STAPHYLOCOCCUS AUREUS  Final      Susceptibility   Staphylococcus aureus - MIC*    CLINDAMYCIN <=0.25 SENSITIVE Sensitive     ERYTHROMYCIN >=8  RESISTANT Resistant     GENTAMICIN <=0.5 SENSITIVE Sensitive     LEVOFLOXACIN <=0.12 SENSITIVE Sensitive     OXACILLIN 0.5 SENSITIVE Sensitive     PENICILLIN >=0.5 RESISTANT Resistant     RIFAMPIN <=0.5 SENSITIVE Sensitive     TRIMETH/SULFA <=10 SENSITIVE Sensitive     VANCOMYCIN 1 SENSITIVE Sensitive     TETRACYCLINE <=1 SENSITIVE Sensitive     MOXIFLOXACIN <=0.25 SENSITIVE Sensitive     * MODERATE STAPHYLOCOCCUS AUREUS  Gram stain     Status: None   Collection Time: 12/01/14  9:31 PM  Result Value Ref Range Status   Specimen Description WOUND  Final   Special Requests LUMBAR  Final   Gram Stain   Final    ABUNDANT WBC PRESENT,BOTH PMN AND MONONUCLEAR RARE GRAM POSITIVE COCCI IN CLUSTERS Gram Stain Report Called  to,Read Back By and Verified With: V BROWN 2232 12/01/14 A BROWNING    Report Status 12/01/2014 FINAL  Final  Fungus Culture with Smear     Status: None (Preliminary result)   Collection Time: 12/01/14  9:31 PM  Result Value Ref Range Status   Specimen Description WOUND  Final   Special Requests LUMBAR  Final   Fungal Smear   Final    NO YEAST OR FUNGAL ELEMENTS SEEN Performed at [facility]    Culture   Final    CULTURE IN PROGRESS FOR FOUR WEEKS Performed at [facility]    Report Status PENDING  Incomplete  Urine culture     Status: None   Collection Time: 12/01/14  9:44 PM  Result Value Ref Range Status   Specimen Description URINE, RANDOM  Final   Special Requests IN/OUT CATH URINE  Final   Culture NO GROWTH 1 DAY  Final   Report Status 12/03/2014 FINAL  Final    Studies/Results: No results found.  Assessment/Plan:  1) post op wound infection following L3-4, L4-5 lumbar decompression with purulence superficial to fascia - growing Staph aureus.  -continue with cefazolin 2 grams every 8 hours for 4 weeks through July 19th.   -antibiotics per SNF -weekly cbc, [place], [place] for Infectious Disease [place] [email or web address] [phone] pager   [phone] cell 12/04/2014, 4:21 PM

## 2014-12-04 NOTE — Progress Notes (Signed)
PHARMACIST - PHYSICIAN COMMUNICATION  CONCERNING:  Vancomycin   RECOMMENDATION: Wound culture with MSSA  Consider switch to Ancef?   Thank you Anette Guarneri, PharmD [phone]

## 2014-12-04 NOTE — Clinical Social Work Note (Signed)
CSW following for possible SNF placement at time of discharge.  CSW awaiting confirmation of IV antibiotics prescription at time of discharge for SNF referral to be completed.  Karen [name], [place] Clinical Social Work Department Orthopedics [phone]) and Surgical [phone])

## 2014-12-05 LAB — FUNGUS CULTURE W SMEAR: FUNGAL SMEAR: NONE SEEN

## 2014-12-05 LAB — BASIC METABOLIC PANEL
Anion gap: 10 (ref 5–15)
BUN: 7 mg/dL (ref 6–20)
CO2: 38 mmol/L — ABNORMAL HIGH (ref 22–32)
Calcium: 8.9 mg/dL (ref 8.9–10.3)
Chloride: 92 mmol/L — ABNORMAL LOW (ref 101–111)
Creatinine, Ser: 0.71 mg/dL (ref 0.44–1.00)
GFR calc Af Amer: >60 mL/min (ref >60)
GFR calc non Af Amer: >60 mL/min (ref >60)
Glucose, Bld: 92 mg/dL (ref 65–99)
Potassium: 3.7 mmol/L (ref 3.5–5.1)
Sodium: 140 mmol/L (ref 135–145)

## 2014-12-05 MED ORDER — MAGNESIUM CITRATE PO SOLN
0.5000 | Freq: Once | ORAL | Status: AC
  Administered 2014-12-05: 0.5 via ORAL
  Filled 2014-12-05: qty 296

## 2014-12-05 MED ORDER — HYDROMORPHONE HCL 1 MG/ML IJ SOLN
2.0000 mg | Freq: Once | INTRAMUSCULAR | Status: AC
  Administered 2014-12-05: 2 mg via INTRAVENOUS
  Filled 2014-12-05: qty 2

## 2014-12-05 MED ORDER — LEVOTHYROXINE SODIUM 100 MCG PO TABS
100.0000 ug | ORAL_TABLET | Freq: Every day | ORAL | Status: DC
  Administered 2014-12-05 – 2014-12-09 (×5): 100 ug via ORAL
  Filled 2014-12-05 (×5): qty 1

## 2014-12-05 MED ORDER — FLEET ENEMA 7-19 GM/118ML RE ENEM
1.0000 | ENEMA | Freq: Once | RECTAL | Status: AC
  Administered 2014-12-05: 1 via RECTAL
  Filled 2014-12-05: qty 1

## 2014-12-05 MED ORDER — SODIUM CHLORIDE 0.9 % IJ SOLN
10.0000 mL | INTRAMUSCULAR | Status: DC | PRN
  Administered 2014-12-05 – 2014-12-09 (×2): 10 mL
  Filled 2014-12-05: qty 40

## 2014-12-05 NOTE — Progress Notes (Signed)
Physical Therapy Treatment Patient Details Name: Karen [name] MRN: [id number] DOB: 1945/08/03 Today's Date: 12/05/2014    History of Present Illness Patient is 10 weeks s/p lumbar decompression and insertion of coflex device for spinal stenosis. Post-op course complicated by wound dehisence 3-4 weeks after surgery. Patient taken back to the OR for I+D of the wound and primary closure. At that time there was no gross contamination of prulent drainage. Patient was doing well for 2 weeks and stopped oral ABX on 11/21/14. Presents to my office today with 2-3 day history of drainage. As a resultgross contamination and drainage I elected to admit her and move forward with formal wash-out and vac dressing application    PT Comments    Patient tolerating ambulation well. Mobilizing with use of RW, cues for posture and safety. Will continue to see and progress as tolerated.  Follow Up Recommendations  Home health PT;Supervision/Assistance - 24 hour     Equipment Recommendations  None recommended by PT    Recommendations for Other Services       Precautions / Restrictions Precautions Precautions: Back Precaution Comments: Pt unable to recall no twisting, reviewed all back precautions w/ pt Restrictions Weight Bearing Restrictions: No    Mobility  Bed Mobility Overal bed mobility: Modified Independent Bed Mobility: Rolling;Sit to Sidelying Rolling: Modified independent (Device/Increase time)       Sit to sidelying: Modified independent (Device/Increase time) General bed mobility comments: Good log roll technique w/ use of bed rails  Transfers Overall transfer level: Needs assistance Equipment used: Rolling walker (2 wheeled) Transfers: Sit to/from Stand Sit to Stand: Supervision         General transfer comment: assist for line management, VCs to not abandon RW  Ambulation/Gait Ambulation/Gait assistance: Supervision Ambulation Distance (Feet): 420 Feet Assistive  device: Rolling walker (2 wheeled) Gait Pattern/deviations: Step-through pattern;Antalgic;Trunk flexed Gait velocity: slow Gait velocity interpretation: Below normal speed for age/gender General Gait Details: VCs for upright positioning, one stand rest breaks   Stairs            Wheelchair Mobility    Modified Rankin (Stroke Patients Only)       Balance     Sitting balance-Leahy Scale: Good     Standing balance support: During functional activity Standing balance-Leahy Scale: Fair                      Cognition Arousal/Alertness: Awake/alert Behavior During Therapy: WFL for tasks assessed/performed Overall Cognitive Status: Within Functional Limits for tasks assessed       Memory: Decreased recall of precautions              Exercises      General Comments        Pertinent Vitals/Pain Pain Assessment: 0-10 Pain Score: 2  Pain Location: back Pain Descriptors / Indicators: Sore Pain Intervention(s): Monitored during session;Repositioned;Relaxation    Home Living                      Prior Function            PT Goals (current goals can now be found in the care plan section) Acute Rehab PT Goals Patient Stated Goal: Pt says she is now planning to go SNF prior to home PT Goal Formulation: With patient Time For Goal Achievement: 12/09/14 Potential to Achieve Goals: Good Progress towards PT goals: Progressing toward goals    Frequency  Min 3X/week    PT Plan  Current plan remains appropriate    Co-evaluation             End of Session Equipment Utilized During Treatment: Gait belt Activity Tolerance: Patient tolerated treatment well Patient left: in chair;with call bell/phone within reach     Time: [id number] PT Time Calculation (min) (ACUTE ONLY): 18 min  Charges:  $Gait Training: 8-22 mins                    G CodesDuncan Dull December 21, 2014, 4:36 PM Alben Deeds, [place] DPT  [phone]

## 2014-12-05 NOTE — Clinical Social Work Note (Signed)
Clinical Social Work Assessment  Patient Details  Name: Karen [name] MRN: [id number] Date of Birth: Jun 19, 1945  Date of referral:  12/05/14               Reason for consult:  Facility Placement, Discharge Planning                Permission sought to share information with:  Chartered certified accountant granted to share information::  Yes, Verbal Permission Granted  Name::     n/a  Agency::  [place] / [facility]  Relationship::  n/a  Contact Information:  n/a  Housing/Transportation Living arrangements for the past 2 months:  Single Family Home Source of Information:  Patient Patient Interpreter Needed:  None Criminal Activity/Legal Involvement Pertinent to Current Situation/Hospitalization:  No - Comment as needed Significant Relationships:  Spouse Lives with:  Spouse Do you feel safe going back to the place where you live?  No (Unable to care for wound vac and IV antibiotics) Need for family participation in patient care:  No (Coment)  Care giving concerns:  Patient expressed no concerns at this time.   Social Worker assessment / plan:  CSW received referral for possible SNF placement at time of discharge. CSW informed by medical team that patient is unable to care for wound vac and IV antibiotics at home and will require SNF placement. CSW spoke with patient regarding discharge disposition. Patient confirmed discharge plan stating preference for [facility]. CSW to continue to follow and assist with discharge planning needs.  Employment status:  Retired Nurse, adult PT Recommendations:  [place] / Referral to community resources:  [place]  Patient/Family's Response to care:  Patient understanding and agreeable to CSW plan of care.  Patient/Family's Understanding of and Emotional Response to Diagnosis, Current Treatment, and Prognosis:  Patient understanding and agreeable to CSW plan  of care.  Emotional Assessment Appearance:  Appears stated age Attitude/Demeanor/Rapport:  Other (Pleasant) Affect (typically observed):  Accepting, Appropriate Orientation:  Oriented to Self, Oriented to Place, Oriented to  Time, Oriented to Situation Alcohol / Substance use:  Not Applicable Psych involvement (Current and /or in the community):  No (Comment) (Not appropriate on this admission.)  Discharge Needs  Concerns to be addressed:  No discharge needs identified Readmission within the last 30 days:  No Current discharge risk:  None Barriers to Discharge:  No Barriers Identified   Caroline Sauger, LCSW 12/05/2014, 4:14 PM [phone]

## 2014-12-05 NOTE — Progress Notes (Signed)
    Subjective: Procedure(s) (LRB): WOUND VAC DRESSING CHANGE  (N/A) 2 Days Post-Op  Patient reports pain as 2 on 0-10 scale.  Reports none leg pain reports incisional back pain   Positive void Negative bowel movement Positive flatus Negative chest pain or shortness of breath  Objective: Vital signs in last 24 hours: Temp:  [98.3 F (36.8 C)-99.2 F (37.3 C)] 98.7 F (37.1 C) (06/24 0621) Pulse Rate:  [74-79] 79 (06/24 0621) Resp:  [18-20] 18 (06/24 0621) BP: (99-136)/(33-66) 136/66 mmHg (06/24 0621) SpO2:  [95 %-96 %] 96 % (06/24 0621)  Intake/Output from previous day: 06/23 0701 - 06/24 0700 In: 984.3 [P.O.:300; I.V.:684.3] Out: 625 [Urine:450; Drains:175]  Labs: No results for input(s): WBC, RBC, HCT, PLT in the last 72 hours.  Recent Labs  12/05/14 0516  NA 140  K 3.7  CL 92*  CO2 38*  BUN 7  CREATININE 0.71  GLUCOSE 92  CALCIUM 8.9   No results for input(s): LABPT, INR in the last 72 hours.  Physical Exam: Neurologically intact ABD soft Intact pulses distally Compartment soft  Vac changed: no purulent drainage.  Erythema at wound edges no necrosis noted  Assessment/Plan: Patient stable  xrays n/a Continue mobilization with physical therapy Continue care  Up with therapy  Abx per ID service - ancef 2gm IV q8 for 4 weeks No radicular leg pain - ambulating well Constipation - will address today Plan on SNF d/c next week If wound doing well may consider primary closure next week.  Melina Schools, MD [place] [phone]

## 2014-12-05 NOTE — Clinical Social Work Placement (Signed)
   CLINICAL SOCIAL WORK PLACEMENT  NOTE  Date:  12/05/2014  Patient Details  Name: Karen [name] MRN: [id number] Date of Birth: 1945-08-25  Clinical Social Work is seeking post-discharge placement for this patient at the [place] level of care (*CSW will initial, date and re-position this form in  chart as items are completed):  Yes   Patient/family provided with [place] Work Department's list of facilities offering this level of care within the geographic area requested by the patient (or if unable, by the patient's family).  Yes   Patient/family informed of their freedom to choose among providers that offer the needed level of care, that participate in Medicare, Medicaid or managed care program needed by the patient, have an available bed and are willing to accept the patient.  Yes   Patient/family informed of [place]'s ownership interest in [facility] and [facility], as well as of the fact that they are under no obligation to receive care at these facilities.  PASRR submitted to EDS on 12/05/14     PASRR number received on 12/05/14     Existing PASRR number confirmed on  (n/a)     FL2 transmitted to all facilities in geographic area requested by pt/family on 12/05/14     FL2 transmitted to all facilities within larger geographic area on 12/05/14     Patient informed that his/her managed care company has contracts with or will negotiate with certain facilities, including the following:   (yes, [facility] and [facility])         Patient/family informed of bed offers received.  Patient chooses bed at       Physician recommends and patient chooses bed at      Patient to be transferred to   on  .  Patient to be transferred to facility by       Patient family notified on   of transfer.  Name of family member notified:        PHYSICIAN Please sign FL2     Additional Comment:     _______________________________________________ Caroline Sauger, LCSW 12/05/2014, 4:20 PM [phone]

## 2014-12-05 NOTE — Progress Notes (Signed)
[place] for Infectious Disease  Date of Admission:  12/01/2014  Antibiotics: cefazolin  Subjective: No new events  Objective: Temp:  [98.3 F (36.8 C)-99.2 F (37.3 C)] 98.7 F (37.1 C) (06/24 0621) Pulse Rate:  [74-79] 79 (06/24 0621) Resp:  [18-20] 18 (06/24 0621) BP: (99-136)/(33-66) 136/66 mmHg (06/24 0621) SpO2:  [95 %-96 %] 96 % (06/24 0621)  General: awake, alert, nad Skin: no rashes Lungs: CTA B Cor: RRR   Lab Results Lab Results  Component Value Date   WBC 9.5 12/02/2014   HGB 9.2* 12/02/2014   HCT 28.0* 12/02/2014   MCV 92.7 12/02/2014   PLT 242 12/02/2014    Lab Results  Component Value Date   CREATININE 0.71 12/05/2014   BUN 7 12/05/2014   NA 140 12/05/2014   K 3.7 12/05/2014   CL 92* 12/05/2014   CO2 38* 12/05/2014    Lab Results  Component Value Date   ALT 16 03/19/2013   AST 18 03/19/2013   ALKPHOS 112 03/19/2013   BILITOT 0.3 03/19/2013      Microbiology: Recent Results (from the past 240 hour(s))  Surgical pcr screen     Status: None   Collection Time: 12/01/14  7:53 PM  Result Value Ref Range Status   MRSA, PCR NEGATIVE NEGATIVE Final   Staphylococcus aureus NEGATIVE NEGATIVE Final    Comment:        The Xpert SA Assay (FDA approved for NASAL specimens in patients over 67 years of age), is one component of a comprehensive surveillance program.  Test performance has been validated by [facility] for patients greater than or equal to 29 year old. It is not intended to diagnose infection nor to guide or monitor treatment.   Anaerobic culture     Status: None (Preliminary result)   Collection Time: 12/01/14  9:31 PM  Result Value Ref Range Status   Specimen Description WOUND  Final   Special Requests LUMBAR  Final   Gram Stain   Final    ABUNDANT WBC PRESENT,BOTH PMN AND MONONUCLEAR NO SQUAMOUS EPITHELIAL CELLS SEEN RARE GRAM POSITIVE COCCI IN CLUSTERS Performed at [facility] Performed at [facility]    Culture   Final    NO ANAEROBES ISOLATED; CULTURE IN PROGRESS FOR 5 DAYS Performed at [facility]    Report Status PENDING  Incomplete  Wound culture     Status: None   Collection Time: 12/01/14  9:31 PM  Result Value Ref Range Status   Specimen Description WOUND  Final   Special Requests LUMBAR  Final   Gram Stain   Final    ABUNDANT WBC PRESENT,BOTH PMN AND MONONUCLEAR NO SQUAMOUS EPITHELIAL CELLS SEEN RARE GRAM POSITIVE COCCI IN PAIRS IN CLUSTERS Gram Stain Report Called to,Read Back By and Verified With: Gram Stain Report Called to,Read Back By and Verified With: V.BROWN AT 2232 ON [id number] BY A.BROWNING Performed at [facility] Performed at [facility]    Culture   Final    MODERATE STAPHYLOCOCCUS AUREUS Note: RIFAMPIN AND GENTAMICIN SHOULD NOT BE USED AS SINGLE DRUGS FOR TREATMENT OF STAPH INFECTIONS. This organism DOES NOT demonstrate inducible Clindamycin resistance in vitro. Performed at [facility]    Report Status 12/04/2014 FINAL  Final   Organism ID, Bacteria STAPHYLOCOCCUS AUREUS  Final      Susceptibility   Staphylococcus aureus - MIC*    CLINDAMYCIN <=0.25 SENSITIVE Sensitive     ERYTHROMYCIN >=8  RESISTANT Resistant     GENTAMICIN <=0.5 SENSITIVE Sensitive     LEVOFLOXACIN <=0.12 SENSITIVE Sensitive     OXACILLIN 0.5 SENSITIVE Sensitive     PENICILLIN >=0.5 RESISTANT Resistant     RIFAMPIN <=0.5 SENSITIVE Sensitive     TRIMETH/SULFA <=10 SENSITIVE Sensitive     VANCOMYCIN 1 SENSITIVE Sensitive     TETRACYCLINE <=1 SENSITIVE Sensitive     MOXIFLOXACIN <=0.25 SENSITIVE Sensitive     * MODERATE STAPHYLOCOCCUS AUREUS  Gram stain     Status: None   Collection Time: 12/01/14  9:31 PM  Result Value Ref Range Status   Specimen Description WOUND  Final   Special Requests LUMBAR  Final   Gram Stain   Final    ABUNDANT WBC PRESENT,BOTH PMN AND MONONUCLEAR RARE GRAM POSITIVE COCCI IN CLUSTERS Gram Stain Report Called  to,Read Back By and Verified With: V BROWN 2232 12/01/14 A BROWNING    Report Status 12/01/2014 FINAL  Final  Fungus Culture with Smear     Status: None (Preliminary result)   Collection Time: 12/01/14  9:31 PM  Result Value Ref Range Status   Specimen Description WOUND  Final   Special Requests LUMBAR  Final   Fungal Smear   Final    NO YEAST OR FUNGAL ELEMENTS SEEN Performed at [facility]    Culture   Final    CULTURE IN PROGRESS FOR FOUR WEEKS Performed at [facility]    Report Status PENDING  Incomplete  Urine culture     Status: None   Collection Time: 12/01/14  9:44 PM  Result Value Ref Range Status   Specimen Description URINE, RANDOM  Final   Special Requests IN/OUT CATH URINE  Final   Culture NO GROWTH 1 DAY  Final   Report Status 12/03/2014 FINAL  Final    Studies/Results: No results found.  Assessment/Plan:  1) post op wound infection following L3-4, L4-5 lumbar decompression with purulence superficial to fascia - growing Staph aureus.  -continue with cefazolin 2 grams every 8 hours for 4 weeks through July 19th.   -antibiotics per SNF -weekly cbc, cmp  We will arrange follow up in about 2-3 weeks.   Thanks for consult   Scharlene Gloss, [place] for Infectious Disease [place] [email or web address] [phone] pager   [phone] cell 12/05/2014, 12:07 PM

## 2014-12-06 LAB — ANAEROBIC CULTURE

## 2014-12-06 NOTE — Progress Notes (Signed)
    Subjective: Procedure(s) (LRB): WOUND VAC DRESSING CHANGE  (N/A) 3 Days Post-Op  Patient reports pain as 3 on 0-10 scale.  Reports none leg pain reports incisional back pain   Positive void Positive bowel movement Positive flatus Negative chest pain or shortness of breath  Objective: Vital signs in last 24 hours: Temp:  [97.6 F (36.4 C)-98.3 F (36.8 C)] 98.2 F (36.8 C) (06/25 6433) Pulse Rate:  [72-79] 72 (06/25 0613) Resp:  [16-18] 16 (06/25 0613) BP: (135-164)/(51-86) 164/84 mmHg (06/25 0613) SpO2:  [92 %-97 %] 92 % (06/25 0613)  Intake/Output from previous day: 06/24 0701 - 06/25 0700 In: 720 [P.O.:720] Out: 1175 [Urine:800; Drains:375]  Labs: No results for input(s): WBC, RBC, HCT, PLT in the last 72 hours.  Recent Labs  12/05/14 0516  NA 140  K 3.7  CL 92*  CO2 38*  BUN 7  CREATININE 0.71  GLUCOSE 92  CALCIUM 8.9   No results for input(s): LABPT, INR in the last 72 hours.  Physical Exam: Neurologically intact Intact pulses distally Dorsiflexion/Plantar flexion intact Compartment soft VAC is functioning well.  wound clean and intact  Assessment/Plan: Patient stable  xrays n/a Continue mobilization with physical therapy Continue care  Continue iv abx's Ambulation ab lib Plan on VAC dressing change on Monday - may consider primary closure next week vs primary closure  Melina Schools, MD [place] [phone]

## 2014-12-06 NOTE — Progress Notes (Signed)
CSW provided list of bed offers for SNF facilities.   Pt/Family prefers:  [facility] will f/c with D/C planning when Pt is medically ready for discharge.     [facility]  2S, 102M,3S, 5N, 6N [phone]

## 2014-12-06 NOTE — Care Management Note (Addendum)
Case Management Note  Patient Details  Name: Karen [name] MRN: [id number] Date of Birth: Oct 06, 1945  Subjective/Objective:                  Repeat washout of the spine wound and VAC dressing change.  Action/Plan: SW consulted for discharge planning as pt is going to SNF.CM provided Medicare IM and pt verbalized understanding.  CM available if any additional needs arise.  Expected Discharge Date:                  Expected Discharge Plan:  Skilled Nursing Facility  In-House Referral:  Clinical Social Work  Discharge planning Services  CM Consult  Post Acute Care Choice:    Choice offered to:     DME Arranged:    DME Agency:     HH Arranged:    [place] Agency:     Status of Service:  Completed, signed off  Medicare Important Message Given:  Yes Date Medicare IM Given:  12/06/14 Medicare IM give by:  Marcy Panning, RN, BSN Date Additional Medicare IM Given:    Additional Medicare Important Message give by:     If discussed at [place] of Stay Meetings, dates discussed:    Additional Comments:  Guido Sander, RN 12/06/2014, 5:03 PM

## 2014-12-07 NOTE — Progress Notes (Signed)
Subjective: 4 Days Post-Op Procedure(s) (LRB): WOUND VAC DRESSING CHANGE  (N/A) Patient reports pain as 1 on 0-10 scale. Doing well. Up sitting in the Bed. No Vac problems.Afebrile.   Objective: Vital signs in last 24 hours: Temp:  [98.2 F (36.8 C)-98.6 F (37 C)] 98.2 F (36.8 C) (06/26 0553) Pulse Rate:  [71-79] 74 (06/26 0553) Resp:  [18] 18 (06/25 1312) BP: (133-142)/(80-86) 142/86 mmHg (06/26 0553) SpO2:  [94 %-100 %] 100 % (06/26 0553)  Intake/Output from previous day: 06/25 0701 - 06/26 0700 In: 600 [P.O.:600] Out: 100 [Drains:100] Intake/Output this shift:    No results for input(s): HGB in the last 72 hours. No results for input(s): WBC, RBC, HCT, PLT in the last 72 hours.  Recent Labs  12/05/14 0516  NA 140  K 3.7  CL 92*  CO2 38*  BUN 7  CREATININE 0.71  GLUCOSE 92  CALCIUM 8.9   No results for input(s): LABPT, INR in the last 72 hours.  Wound Vac draining.  Assessment/Plan: 4 Days Post-Op Procedure(s) (LRB): WOUND VAC DRESSING CHANGE  (N/A) Up with therapy  Karen [name] A 12/07/2014, 7:48 AM

## 2014-12-08 LAB — CBC WITH DIFFERENTIAL/PLATELET
BASOS ABS: 0 10*3/uL (ref 0.0–0.1)
BASOS PCT: 1 % (ref 0–1)
Eosinophils Absolute: 0.5 10*3/uL (ref 0.0–0.7)
Eosinophils Relative: 7 % — ABNORMAL HIGH (ref 0–5)
HCT: 30.2 % — ABNORMAL LOW (ref 36.0–46.0)
Hemoglobin: 9.8 g/dL — ABNORMAL LOW (ref 12.0–15.0)
Lymphocytes Relative: 30 % (ref 12–46)
Lymphs Abs: 2.2 10*3/uL (ref 0.7–4.0)
MCH: 30.6 pg (ref 26.0–34.0)
MCHC: 32.5 g/dL (ref 30.0–36.0)
MCV: 94.4 fL (ref 78.0–100.0)
MONOS PCT: 7 % (ref 3–12)
Monocytes Absolute: 0.5 10*3/uL (ref 0.1–1.0)
NEUTROS PCT: 55 % (ref 43–77)
Neutro Abs: 4.1 10*3/uL (ref 1.7–7.7)
PLATELETS: 464 10*3/uL — AB (ref 150–400)
RBC: 3.2 MIL/uL — ABNORMAL LOW (ref 3.87–5.11)
RDW: 14.3 % (ref 11.5–15.5)
WBC: 7.2 10*3/uL (ref 4.0–10.5)

## 2014-12-08 LAB — C-REACTIVE PROTEIN: CRP: 3.8 mg/dL — ABNORMAL HIGH (ref <1.0)

## 2014-12-08 MED ORDER — HYDROMORPHONE HCL 1 MG/ML IJ SOLN
INTRAMUSCULAR | Status: AC
  Administered 2014-12-08: 1 mg
  Filled 2014-12-08: qty 1

## 2014-12-08 NOTE — Progress Notes (Signed)
Dr.Brooks on bedside for wound vac dressing change. Received a verbal order to give dilaudid 1 mg IV for pain. Order carried out and given to patient. Patient tolerated well the procedure. Nrsg will continue to monitor.

## 2014-12-08 NOTE — Care Management (Signed)
Important Message  Patient Details  Name: Karen [name] MRN: [id number] Date of Birth: 13-Mar-1946   Medicare Important Message Given:  Yes-second notification given    Delorse Lek 12/08/2014, 1:05 PM

## 2014-12-08 NOTE — Anesthesia Postprocedure Evaluation (Signed)
  Anesthesia Post-op Note  Patient: Karen [name]  Procedure(s) Performed: Procedure(s): WOUND VAC DRESSING CHANGE  (N/A)  Patient Location: PACU  Anesthesia Type:General  Level of Consciousness: awake and alert   Airway and Oxygen Therapy: Patient Spontanous Breathing  Post-op Pain: mild  Post-op Assessment: Post-op Vital signs reviewed LLE Motor Response: Purposeful movement, Responds to commands LLE Sensation: Full sensation RLE Motor Response: Purposeful movement, Responds to commands RLE Sensation: Full sensation      Post-op Vital Signs: stable  Last Vitals:  Filed Vitals:   12/08/14 0509  BP: 134/70  Pulse: 65  Temp: 36.8 C  Resp: 18    Complications: No apparent anesthesia complications

## 2014-12-08 NOTE — Care Management (Signed)
Utilization review completed by Hermon Zea N. Jaysten Essner, RN BSN 

## 2014-12-08 NOTE — Progress Notes (Signed)
    Subjective: Procedure(s) (LRB): WOUND VAC DRESSING CHANGE  (N/A) 5 Days Post-Op  Patient reports pain as 2 on 0-10 scale.  Reports none leg pain reports incisional back pain   Positive void Positive bowel movement Positive flatus Negative chest pain or shortness of breath  Objective: Vital signs in last 24 hours: Temp:  [98.2 F (36.8 C)-98.5 F (36.9 C)] 98.2 F (36.8 C) (06/27 0509) Pulse Rate:  [65-84] 65 (06/27 0509) Resp:  [18] 18 (06/27 0509) BP: (111-134)/(58-70) 134/70 mmHg (06/27 0509) SpO2:  [94 %-98 %] 94 % (06/27 0509)  Intake/Output from previous day: 06/26 0701 - 06/27 0700 In: 240 [P.O.:240] Out: -   Labs: No results for input(s): WBC, RBC, HCT, PLT in the last 72 hours. No results for input(s): NA, K, CL, CO2, BUN, CREATININE, GLUCOSE, CALCIUM in the last 72 hours. No results for input(s): LABPT, INR in the last 72 hours.  Physical Exam: Neurologically intact Neurovascular intact Dorsiflexion/Plantar flexion intact Incision: no drainage Compartment soft  Assessment/Plan: Patient stable  xrays n/a Continue mobilization with physical therapy Continue care  Up with therapy Discharge to SNF  VAC changed - no longer using irrigating system Plan on d/c to SNF with KCI wound vac at 125 continuous VAC change M/W/F Plan on return to OR next week for primary wound closure  Overall patient doing well.  No complaints of neuropathic leg pain. Will check CRP and WBC count today   Melina Schools, MD [place] [phone]

## 2014-12-08 NOTE — Clinical Social Work Note (Signed)
Bed available at [facility] once patient medically stable for discharge.  Lubertha Sayres, [place] Clinical Social Work Department Orthopedics [phone]) and Surgical [phone])

## 2014-12-08 NOTE — Progress Notes (Signed)
Physical Therapy Treatment Patient Details Name: Karen [name] MRN: [id number] DOB: [birth year] Today's Date: 12/08/2014    History of Present Illness Patient is 10 weeks s/p lumbar decompression and insertion of coflex device for spinal stenosis. Post-op course complicated by wound dehisence 3-4 weeks after surgery. Patient taken back to the OR for I+D of the wound and primary closure. At that time there was no gross contamination of prulent drainage. Patient was doing well for 2 weeks and stopped oral ABX on 11/21/14.  As a resultgross contamination and drainage I elected to admit her and move forward with formal wash-out and vac dressing application    PT Comments    Patient is making good progress with PT.  From a mobility standpoint anticipate patient will be ready for DC to SNF when medically ready.     Follow Up Recommendations  Home health PT;Supervision/Assistance - 24 hour     Equipment Recommendations  None recommended by PT    Recommendations for Other Services       Precautions / Restrictions Precautions Precautions: Back Precaution Booklet Issued: No Precaution Comments: Pt not able to recall "no twisting." educated on all precautions and remember by "BAT" Restrictions Weight Bearing Restrictions: No    Mobility  Bed Mobility               General bed mobility comments: pt found and returned to sitting edge of bed  Transfers Overall transfer level: Needs assistance Equipment used: Rolling walker (2 wheeled) Transfers: Sit to/from Stand Sit to Stand: Supervision         General transfer comment: cues for back precautions/posture as well as hand placement  Ambulation/Gait Ambulation/Gait assistance: Supervision Ambulation Distance (Feet): 500 Feet Assistive device: Rolling walker (2 wheeled) Gait Pattern/deviations: Antalgic         Stairs            Wheelchair Mobility    Modified Rankin (Stroke Patients Only)       Balance      Sitting balance-Leahy Scale: Good       Standing balance-Leahy Scale: Fair                      Cognition Arousal/Alertness: Awake/alert Behavior During Therapy: WFL for tasks assessed/performed Overall Cognitive Status: Within Functional Limits for tasks assessed       Memory: Decreased recall of precautions              Exercises      General Comments        Pertinent Vitals/Pain Pain Assessment: 0-10 Pain Score: 7  Pain Location: back Pain Descriptors / Indicators: Sore Pain Intervention(s): Monitored during session;Limited activity within patient's tolerance    Home Living                      Prior Function            PT Goals (current goals can now be found in the care plan section) Acute Rehab PT Goals Patient Stated Goal: Pt says she is now planning to go SNF prior to home Potential to Achieve Goals: Good    Frequency  Min 3X/week    PT Plan Current plan remains appropriate    Co-evaluation             End of Session Equipment Utilized During Treatment: Gait belt Activity Tolerance: Patient tolerated treatment well Patient left: with call bell/phone within reach (sitting edge of bed)  Time: 8208-1388 PT Time Calculation (min) (ACUTE ONLY): 17 min  Charges:  $Gait Training: 8-22 mins                    G Codes:      Cassell Clement, PT, CSCS Pager [phone] Office [phone]  12/08/2014, 12:40 PM

## 2014-12-08 NOTE — Progress Notes (Signed)
Occupational Therapy Treatment Patient Details Name: STORIE HEFFERN MRN: [id number] DOB: 03-Mar-1946 Today's Date: 12/08/2014    History of present illness Patient is 10 weeks s/p lumbar decompression and insertion of coflex device for spinal stenosis. Post-op course complicated by wound dehisence 3-4 weeks after surgery. Patient taken back to the OR for I+D of the wound and primary closure. At that time there was no gross contamination of prulent drainage. Patient was doing well for 2 weeks and stopped oral ABX on 11/21/14. Presents to my office today with 2-3 day history of drainage. As a resultgross contamination and drainage I elected to admit her and move forward with formal wash-out and vac dressing application   OT comments  Educated further on back precautions especially during functional transfers and ADL. Pt will benefit from toilet aid for posterior periarea hygiene as she tends to intermittently twist trying to wipe herself. Showed pt a picture of toilet aid and explained use. Plan is for SNF. Will follow on acute to progress ADL.   Follow Up Recommendations  SNF;Supervision/Assistance - 24 hour    Equipment Recommendations  None recommended by OT    Recommendations for Other Services      Precautions / Restrictions Precautions Precautions: Back Precaution Comments: Pt not able to recall "no twisting." educated on all precautions and remember by "BAT" Restrictions Weight Bearing Restrictions: No       Mobility Bed Mobility               General bed mobility comments: pt sitting EOB when OT arrived wtih nursing.  Transfers Overall transfer level: Needs assistance Equipment used: Rolling walker (2 wheeled) Transfers: Sit to/from Stand Sit to Stand: Supervision         General transfer comment: cues for back precautions/posture as well as hand placement    Balance                                   ADL                           Toilet Transfer: BSC;Min guard;Stand-pivot;RW   Toileting- Clothing Manipulation and Hygiene: Sit to/from stand;Minimal assistance--to hold up gowns so pt did not bend.         General ADL Comments: Reviewed back precautions at length. pt not able to recall "no twisting' on her own so educated on trying to remember precautions by "BAT." Discussed need to keep back straight during functional tranfers as she tends to lean forward when she stands up. Educated pt on pushing through LEs with back straight. Even with toileting hygiene/clothing management, pt tending to lean forward to pull up gown and wipe. Educated that pt is safer to sit and wipe the front periarea (seated postiion) as standing tends to make her want to lean forward. She twists slightly trying to reach to posterior periarea so educated on toilet aid option also and coverage. Pt states she can get family to pick up toilet aid for her. Pt able to cross LEs up to don socks and pants but does have a reacher and sock aid. Discussed other uses of reacher including to pull up covers, pick up items, etc. Only pivoted to [facility] as floor just mopped so dried off area around bed for pivot to [facility].       Vision  Perception     Praxis      Cognition   Behavior During Therapy: WFL for tasks assessed/performed Overall Cognitive Status: Within Functional Limits for tasks assessed       Memory: Decreased recall of precautions               Extremity/Trunk Assessment               Exercises     Shoulder Instructions       General Comments      Pertinent Vitals/ Pain       Pain Assessment: 0-10 Pain Score: 3  Pain Location: back Pain Descriptors / Indicators: Sore Pain Intervention(s): Repositioned  Home Living                                          Prior Functioning/Environment              Frequency Min 2X/week     Progress Toward Goals  OT Goals(current  goals can now be found in the care plan section)  Progress towards OT goals: Progressing toward goals     Plan Discharge plan remains appropriate    Co-evaluation                 End of Session Equipment Utilized During Treatment: Rolling walker   Activity Tolerance Patient tolerated treatment well   Patient Left in chair;with call bell/phone within reach   Nurse Communication          Time: 3491-7915 OT Time Calculation (min): 16 min  Charges: OT General Charges $OT Visit: 1 Procedure OT Treatments $Self Care/Home Management : 8-22 mins  Jules Schick  [phone] 12/08/2014, 11:59 AM

## 2014-12-09 ENCOUNTER — Inpatient Hospital Stay
Admission: RE | Admit: 2014-12-09 | Discharge: 2014-12-17 | Disposition: A | Discharge status: Nursing Home (Includes ICF) | Payer: Medicare Other | Source: Ambulatory Visit | Attending: Internal Medicine | Admitting: Internal Medicine

## 2014-12-09 DIAGNOSIS — R1310 Dysphagia, unspecified: Secondary | ICD-10-CM | POA: Diagnosis not present

## 2014-12-09 DIAGNOSIS — I10 Essential (primary) hypertension: Secondary | ICD-10-CM | POA: Diagnosis not present

## 2014-12-09 DIAGNOSIS — B999 Unspecified infectious disease: Secondary | ICD-10-CM | POA: Diagnosis not present

## 2014-12-09 DIAGNOSIS — Z79899 Other long term (current) drug therapy: Secondary | ICD-10-CM | POA: Diagnosis not present

## 2014-12-09 DIAGNOSIS — M6281 Muscle weakness (generalized): Secondary | ICD-10-CM | POA: Diagnosis not present

## 2014-12-09 DIAGNOSIS — Z978 Presence of other specified devices: Secondary | ICD-10-CM | POA: Diagnosis not present

## 2014-12-09 DIAGNOSIS — K625 Hemorrhage of anus and rectum: Secondary | ICD-10-CM | POA: Diagnosis not present

## 2014-12-09 DIAGNOSIS — Z885 Allergy status to narcotic agent status: Secondary | ICD-10-CM | POA: Diagnosis not present

## 2014-12-09 DIAGNOSIS — D500 Iron deficiency anemia secondary to blood loss (chronic): Secondary | ICD-10-CM | POA: Diagnosis not present

## 2014-12-09 DIAGNOSIS — Z882 Allergy status to sulfonamides status: Secondary | ICD-10-CM | POA: Diagnosis not present

## 2014-12-09 DIAGNOSIS — G4730 Sleep apnea, unspecified: Secondary | ICD-10-CM | POA: Diagnosis not present

## 2014-12-09 DIAGNOSIS — R262 Difficulty in walking, not elsewhere classified: Secondary | ICD-10-CM | POA: Diagnosis not present

## 2014-12-09 DIAGNOSIS — T814XXA Infection following a procedure, initial encounter: Secondary | ICD-10-CM | POA: Diagnosis not present

## 2014-12-09 DIAGNOSIS — K5732 Diverticulitis of large intestine without perforation or abscess without bleeding: Secondary | ICD-10-CM | POA: Diagnosis not present

## 2014-12-09 DIAGNOSIS — R7611 Nonspecific reaction to tuberculin skin test without active tuberculosis: Principal | ICD-10-CM

## 2014-12-09 DIAGNOSIS — M4806 Spinal stenosis, lumbar region: Secondary | ICD-10-CM | POA: Diagnosis not present

## 2014-12-09 DIAGNOSIS — E039 Hypothyroidism, unspecified: Secondary | ICD-10-CM | POA: Diagnosis not present

## 2014-12-09 DIAGNOSIS — R278 Other lack of coordination: Secondary | ICD-10-CM | POA: Diagnosis not present

## 2014-12-09 DIAGNOSIS — K429 Umbilical hernia without obstruction or gangrene: Secondary | ICD-10-CM | POA: Diagnosis not present

## 2014-12-09 DIAGNOSIS — Y92234 Operating room of hospital as the place of occurrence of the external cause: Secondary | ICD-10-CM | POA: Diagnosis not present

## 2014-12-09 DIAGNOSIS — K219 Gastro-esophageal reflux disease without esophagitis: Secondary | ICD-10-CM | POA: Diagnosis not present

## 2014-12-09 DIAGNOSIS — M4800 Spinal stenosis, site unspecified: Secondary | ICD-10-CM | POA: Diagnosis not present

## 2014-12-09 DIAGNOSIS — Z853 Personal history of malignant neoplasm of breast: Secondary | ICD-10-CM | POA: Diagnosis not present

## 2014-12-09 DIAGNOSIS — Z452 Encounter for adjustment and management of vascular access device: Secondary | ICD-10-CM | POA: Diagnosis not present

## 2014-12-09 DIAGNOSIS — Z481 Encounter for planned postprocedural wound closure: Secondary | ICD-10-CM | POA: Diagnosis not present

## 2014-12-09 DIAGNOSIS — K6811 Postprocedural retroperitoneal abscess: Secondary | ICD-10-CM | POA: Diagnosis not present

## 2014-12-09 DIAGNOSIS — Z87891 Personal history of nicotine dependence: Secondary | ICD-10-CM | POA: Diagnosis not present

## 2014-12-09 DIAGNOSIS — A4901 Methicillin susceptible Staphylococcus aureus infection, unspecified site: Secondary | ICD-10-CM | POA: Diagnosis not present

## 2014-12-09 DIAGNOSIS — Z5189 Encounter for other specified aftercare: Secondary | ICD-10-CM | POA: Diagnosis not present

## 2014-12-09 DIAGNOSIS — T8189XD Other complications of procedures, not elsewhere classified, subsequent encounter: Secondary | ICD-10-CM | POA: Diagnosis not present

## 2014-12-09 DIAGNOSIS — R1032 Left lower quadrant pain: Secondary | ICD-10-CM | POA: Diagnosis not present

## 2014-12-09 DIAGNOSIS — Y838 Other surgical procedures as the cause of abnormal reaction of the patient, or of later complication, without mention of misadventure at the time of the procedure: Secondary | ICD-10-CM | POA: Diagnosis not present

## 2014-12-09 DIAGNOSIS — E049 Nontoxic goiter, unspecified: Secondary | ICD-10-CM | POA: Diagnosis not present

## 2014-12-09 DIAGNOSIS — L089 Local infection of the skin and subcutaneous tissue, unspecified: Secondary | ICD-10-CM | POA: Diagnosis not present

## 2014-12-09 MED ORDER — ASPIRIN EC 81 MG PO TBEC: 81.0000 mg | DELAYED_RELEASE_TABLET | Freq: Every day | ORAL | Status: DC

## 2014-12-09 MED ORDER — METHOCARBAMOL 500 MG PO TABS: 500.0000 mg | ORAL_TABLET | Freq: Three times a day (TID) | ORAL | Status: DC | PRN

## 2014-12-09 MED ORDER — HYDROCODONE-ACETAMINOPHEN 10-325 MG PO TABS: 1.0000 | ORAL_TABLET | Freq: Four times a day (QID) | ORAL | Status: DC | PRN

## 2014-12-09 MED ORDER — ONDANSETRON HCL 4 MG PO TABS: 4.0000 mg | ORAL_TABLET | Freq: Three times a day (TID) | ORAL | Status: DC | PRN

## 2014-12-09 MED ORDER — CEFAZOLIN SODIUM-DEXTROSE 2-3 GM-% IV SOLR: 2.0000 g | Freq: Three times a day (TID) | INTRAVENOUS | Status: DC

## 2014-12-09 MED ORDER — DOCUSATE SODIUM 100 MG PO CAPS: 100.0000 mg | ORAL_CAPSULE | Freq: Three times a day (TID) | ORAL | Status: DC | PRN

## 2014-12-09 MED ORDER — HEPARIN SOD (PORK) LOCK FLUSH 100 UNIT/ML IV SOLN
250.0000 [IU] | INTRAVENOUS | Status: AC | PRN
  Administered 2014-12-09: 250 [IU]

## 2014-12-09 NOTE — Progress Notes (Addendum)
[name] to be D/C'd Skilled nursing facility: [facility] per MD order.  Discussed with the patient and all questions fully answered.  VSS, Wound vac is clean, dry, intact and was clamped for transport. Vac to be delivered to [facility].  PICC line flushed and capped by IV team prior to discharge.  SNF packet prepared by Education officer, museum and sent with EMS to [facility].  Packet includes prescriptions.  Patient to be escorted via stretcher and D/C to the [facility] via [place].  Report called to Inez Catalina at the [facility].  Micki Riley 12/09/2014 2:35 PM

## 2014-12-09 NOTE — Progress Notes (Signed)
    Subjective: Procedure(s) (LRB): WOUND VAC DRESSING CHANGE  (N/A) 6 Days Post-Op  Patient reports pain as 3 on 0-10 scale.  Reports none leg pain reports incisional back pain   Positive void Positive bowel movement Positive flatus Negative chest pain or shortness of breath  Objective: Vital signs in last 24 hours: Temp:  [97.6 F (36.4 C)-97.9 F (36.6 C)] 97.9 F (36.6 C) (06/28 0517) Pulse Rate:  [70-73] 73 (06/28 0517) Resp:  [18] 18 (06/28 0517) BP: (138-145)/(76-78) 138/76 mmHg (06/28 0517) SpO2:  [95 %-98 %] 96 % (06/28 0517)  Intake/Output from previous day: 06/27 0701 - 06/28 0700 In: 1253 [P.O.:1080; I.V.:123; IV Piggyback:50] Out: -   Labs:  Recent Labs  12/08/14 0830  WBC 7.2  RBC 3.20*  HCT 30.2*  PLT 464*   No results for input(s): NA, K, CL, CO2, BUN, CREATININE, GLUCOSE, CALCIUM in the last 72 hours. No results for input(s): LABPT, INR in the last 72 hours.  Physical Exam: Neurologically intact ABD soft Intact pulses distally Dorsiflexion/Plantar flexion intact Compartment soft  Assessment/Plan: Patient stable  xrays n/a Continue mobilization with physical therapy Continue care  Discharge to SNF  Doing well.  Plan discussed with patient  4-6 weeks IV abx and plan on wound closure next week Wednesday  Melina Schools, [place] [phone]

## 2014-12-09 NOTE — Discharge Instructions (Signed)
Please draw CBC and CRP on Monday 12/15/14 KCI Vac dressing changes M/W/F Wound dressing:  Apply aqua-seal dry dressing on the wound edges to protect skin - then apply vac water tight dressing KCI Vac settings: 125 mm/HG continuous PICC line care per nursing protocal Ancef 2 grams IV every 8 hrs  Plan on return to OR for wound closure on 12/17/14 Arrange transport for office follow up this Friday 12/12/14 and to Hospital for surgery on 12/17/14  Ok to walk as much as able  No need for spinal brace Aspirin of DVT prevention 81mg  (1 tablet) a day

## 2014-12-09 NOTE — Discharge Summary (Addendum)
Patient ID: Karen [name] MRN: [id number] DOB/AGE: 69/20/1947 69 y.o.  Admit date: 12/01/2014 Discharge date: 12/09/2014  Admission Diagnoses:  Principal Problem:   Wound infection after surgery   Discharge Diagnoses:  Principal Problem:   Wound infection after surgery  status post Procedure(s): WOUND VAC DRESSING CHANGE   Past Medical History  Diagnosis Date  . HTN (hypertension)   . Goiter   . Hypothyroidism   . Tubular adenoma   . Diverticulitis   . GERD (gastroesophageal reflux disease)     since gallbladder removed, no more GERD  . Sleep apnea     has never been tested.  was dx with cancer first and had this problem on the back burner  . Breast cancer 2004    bilateral mastectomy-all left lymph nodes removed; chemo. Patient states she had "spot" on the right breast as well.    Surgeries: Procedure(s): WOUND VAC DRESSING CHANGE  on 12/01/2014 - 12/03/2014   Consultants:  Dr Linus Salmons (ID Service)  Discharged Condition: Improved  Hospital Course: Karen [name] is an 69 y.o. female who was admitted 12/01/2014 for operative treatment of Wound infection after surgery. Patient failed conservative treatments (please see the history and physical for the specifics) and had severe unremitting pain that affects sleep, daily activities and work/hobbies. After pre-op clearance, the patient was taken to the operating room on 12/01/2014 - 12/03/2014 and underwent  Procedure(s): WOUND VAC DRESSING CHANGE .    Patient was given perioperative antibiotics: Anti-infectives    Start     Dose/Rate Route Frequency Ordered Stop   12/04/14 1445  ceFAZolin (ANCEF) IVPB 2 g/50 mL premix     2 g 100 mL/hr over 30 Minutes Intravenous 3 times per day 12/04/14 1413     12/04/14 1300  vancomycin (VANCOCIN) 1,250 mg in sodium chloride 0.9 % 250 mL IVPB  Status:  Discontinued     1,250 mg 166.7 mL/hr over 90 Minutes Intravenous Every 24 hours 12/04/14 1134 12/04/14 1402   12/02/14 1315  [MAR Hold]   vancomycin (VANCOCIN) 1,250 mg in sodium chloride 0.9 % 250 mL IVPB  Status:  Discontinued     (MAR Hold since 12/03/14 1515)   1,250 mg 166.7 mL/hr over 90 Minutes Intravenous Every 24 hours 12/02/14 1302 12/03/14 1817   12/02/14 1000  vancomycin (VANCOCIN) IVPB 750 mg/150 ml premix  Status:  Discontinued     750 mg 150 mL/hr over 60 Minutes Intravenous Every 12 hours 12/01/14 2300 12/02/14 1302   12/01/14 2037  vancomycin (VANCOCIN) 1 GM/200ML IVPB    Comments:  Lowder, Hal   : cabinet override      12/01/14 2037 12/01/14 2233       Patient was given sequential compression devices and early ambulation to prevent DVT.   Patient benefited maximally from hospital stay and there were no complications. At the time of discharge, the patient was urinating/moving their bowels without difficulty, tolerating a regular diet, pain is controlled with oral pain medications and they have been cleared by PT/OT.   Recent vital signs: Patient Vitals for the past 24 hrs:  BP Temp Temp src Pulse Resp SpO2  12/09/14 0517 138/76 mmHg 97.9 F (36.6 C) Oral 73 18 96 %  12/08/14 2048 140/78 mmHg 97.8 F (36.6 C) Oral 72 18 98 %  12/08/14 1514 (!) 145/78 mmHg 97.6 F (36.4 C) Oral 70 18 95 %     Recent laboratory studies:  Recent Labs  12/08/14 0830  WBC 7.2  HGB 9.8*  HCT 30.2*  PLT 464*     Discharge Medications:     Medication List    ASK your doctor about these medications        acetaminophen 650 MG CR tablet  Commonly known as:  TYLENOL  Take 1,300 mg by mouth every 8 (eight) hours as needed for pain.     ALPRAZolam 1 MG tablet  Commonly known as:  XANAX  Take 1 mg by mouth 3 (three) times daily as needed for anxiety.     amLODipine 10 MG tablet  Commonly known as:  NORVASC  Take 10 mg by mouth daily.     HYDROcodone-acetaminophen 10-325 MG per tablet  Commonly known as:  NORCO  Take 1 tablet by mouth every 6 (six) hours as needed.     levothyroxine 100 MCG tablet  Commonly  known as:  SYNTHROID, LEVOTHROID  Take 100 mcg by mouth every morning.     lisinopril-hydrochlorothiazide 20-12.5 MG per tablet  Commonly known as:  PRINZIDE,ZESTORETIC  Take 1 tablet by mouth 2 (two) times daily.        Diagnostic Studies: Dg Lumbar Spine 2-3 Views  12/01/2014   CLINICAL DATA:  Infection and surgical site.  EXAM: LUMBAR SPINE - 2-3 VIEW  COMPARISON:  09/25/2014  FINDINGS: Posterior hardware noted at the L3-4 and L4-5 levels. Appearance is stable since prior study. 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 No fracture. No acute bony abnormality.  IMPRESSION: Postoperative changes in the lower lumbar spine. Degenerative disc and facet disease. No acute findings.   Electronically Signed   By: Rolm Baptise M.D.   On: 12/01/2014 18:04        Discharge Plan:  discharge to SNF  Disposition: Hospital course uneventful.  Patient underwent 2 wash-outs of the surgical site infection without complication.  Tolerated bedside VAC dressing changes without significant issue.  WBC count decreased during stay.  Wound granulation began and no active purulent drainage was noted.  Plan on d/c to SNF for [facility] dressing changes and IV antibiotics.  Will require total of 4 weeks of IV Ancef per Dr Linus Salmons (ID service).  Will arrange f/u with Dr Linus Salmons on 12/31/14 (4 weeks iv antibiotics).  Because of holiday week-end I will see patient on Friday 12/12/14. And if wound continues to do well will plan on primary closure on Wednesday 12/17/14.  Patient has remained neurologically intake throughout without any episode of neuropathic leg pain / neurogenic claudication.      Signed: Melina Schools D for Dr. Melina Schools [facility] Orthopaedics [phone] 12/09/2014, 9:21 AM   Medications: Ancef 2 grams IV q8  PICC line flush and maintenance per SNF protocal ASA 81 mg 1 po qd Norco 5 mg 1 po TID prn pain Colace 1 po BID prn Robaxin 500mg  1 po TID prn spasms Zofran 4 mg 1 poTID  prn nausea

## 2014-12-09 NOTE — Clinical Social Work Placement (Signed)
   CLINICAL SOCIAL WORK PLACEMENT  NOTE  Date:  12/09/2014  Patient Details  Name: Karen [name] MRN: [id number] Date of Birth: [birth year]  Clinical Social Work is seeking post-discharge placement for this patient at the [place] level of care (*CSW will initial, date and re-position this form in  chart as items are completed):  Yes   Patient/family provided with [place] Work Department's list of facilities offering this level of care within the geographic area requested by the patient (or if unable, by the patient's family).  Yes   Patient/family informed of their freedom to choose among providers that offer the needed level of care, that participate in Medicare, Medicaid or managed care program needed by the patient, have an available bed and are willing to accept the patient.  Yes   Patient/family informed of [place]'s ownership interest in [facility] and [facility], as well as of the fact that they are under no obligation to receive care at these facilities.  PASRR submitted to EDS on 12/05/14     PASRR number received on 12/05/14     Existing PASRR number confirmed on  (n/a)     FL2 transmitted to all facilities in geographic area requested by pt/family on 12/05/14     FL2 transmitted to all facilities within larger geographic area on 12/05/14     Patient informed that his/her managed care company has contracts with or will negotiate with certain facilities, including the following:   (yes, [facility] and [facility])     Yes   Patient/family informed of bed offers received.  Patient chooses bed at [facility]     Physician recommends and patient chooses bed at  (n/a)    Patient to be transferred to [facility] on 12/09/14.  Patient to be transferred to facility by [facility]     Patient family notified on 12/09/14 of transfer.  Name of family member notified:  Patient updated at bedside.     PHYSICIAN      Additional Comment:    _______________________________________________ Caroline Sauger, LCSW 12/09/2014, 2:34 PM [phone]

## 2014-12-09 NOTE — Discharge Planning (Signed)
Patient to be discharged to [facility]. Patient updated at bedside.  Facility: [facility] RN report number: [phone] Transportation: EMS ([address]  Lubertha Sayres, [place] [phone]) and Surgical [phone])

## 2014-12-10 ENCOUNTER — Other Ambulatory Visit: Payer: Self-pay | Admitting: *Deleted

## 2014-12-10 ENCOUNTER — Non-Acute Institutional Stay (SKILLED_NURSING_FACILITY): Payer: Medicare Other | Admitting: Internal Medicine

## 2014-12-10 DIAGNOSIS — T8189XD Other complications of procedures, not elsewhere classified, subsequent encounter: Secondary | ICD-10-CM

## 2014-12-10 DIAGNOSIS — A4901 Methicillin susceptible Staphylococcus aureus infection, unspecified site: Secondary | ICD-10-CM

## 2014-12-10 DIAGNOSIS — M4806 Spinal stenosis, lumbar region: Secondary | ICD-10-CM

## 2014-12-10 DIAGNOSIS — M48061 Spinal stenosis, lumbar region without neurogenic claudication: Secondary | ICD-10-CM

## 2014-12-10 MED ORDER — ALPRAZOLAM 1 MG PO TABS: 1.0000 mg | ORAL_TABLET | Freq: Three times a day (TID) | ORAL | Status: AC | PRN

## 2014-12-10 NOTE — Progress Notes (Signed)
Patient ID: Karen [name], female   DOB: [birth year], 69 y.o.   MRN: [id number]  Facility; [facility] Chief complaint; admission to SNF post admit to [facility] from 6/20 to 6/28  History; this is a patient who underwent a lumbar decompression surgery for lumbar spinal stenosis in April of this year. She developed a postoperative wound infection. She has been carefully followed by Dr.Comer of infectious disease. Cultures of this grew methicillin sensitive staph aureus she is now on Ancef through 12/30/2014  Reviewing her history and [place] link;  Original admission 4/14 through 4/16 for decompression of L3-L5 with Coflex insertion  Readmitted on 5/27 for irrigation and debridement and placement of any biotic beads. Cultures including fungal, routine CNS were negative at that time  Current admission from 6/20 through 6/28 placement of wound VAC dressing change. Cultures grew methicillin sensitive staph aureus. There was apparently purulent drainage. She was not felt to have a deep infection. She had received empiric vancomycin  Past Medical History  Diagnosis Date  . HTN (hypertension)   . Goiter   . Hypothyroidism   . Tubular adenoma   . Diverticulitis   . GERD (gastroesophageal reflux disease)     since gallbladder removed, no more GERD  . Sleep apnea     has never been tested.  was dx with cancer first and had this problem on the back burner  . Breast cancer 2004    bilateral mastectomy-all left lymph nodes removed; chemo. Patient states she had "spot" on the right breast as well.   Past Surgical History  Procedure Laterality Date  . Esophagogastroduodenoscopy  2008    lipoma distal esophagus, gastritis   . Colonoscopy  2008    colon with few diverticula in sigmoid colon  . Partial hysterectomy    . Bilateral mastectomy w/reconstruction  2004  . Colonoscopy  02/16/2011    Dr. Gala Romney- colon and rectal polyps= tubular adenoma, L sided diverticulosis.  . Esophagogastroduodenoscopy   02/16/2011    Dr. Gala Romney- normal esophagus, small hiatal hernia o/w normal stomach. normal first and second portion of the duodenum  . Maloney dilation  02/16/2011    Procedure: Venia Minks DILATION;  Surgeon: Daneil Dolin, MD;  [facility];  Service: Endoscopy;  Laterality: N/A;  . Savory dilation  02/16/2011    Procedure: SAVORY DILATION;  Surgeon: Daneil Dolin, MD;  [facility];  Service: Endoscopy;  Laterality: N/A;  . Abdominal hysterectomy    . Mastectomy      bilateral 2004 left side had 17 nodes removed  . Breast surgery    . Hernia repair      umbilical hernia was repaired  . Cholecystectomy    . Lumbar laminectomy/decompression microdiscectomy N/A 09/25/2014    Procedure: DECOMPRESSION L3-L5 WITH INSERTION OF COFLEX   ( 2 LEVELS);  Surgeon: Melina Schools, MD;  Location: [place];  Service: Orthopedics;  Laterality: N/A;  . Lumbar laminectomy/decompression microdiscectomy N/A 11/07/2014    Procedure: IRRIGATION AND DEBRIDEMENT BACK WOUND, PLACEMENT OF ANTIBIOTIC BEADS;  Surgeon: Melina Schools, MD;  Location: [place];  Service: Orthopedics;  Laterality: N/A;  . Injection knee Right 11/07/2014    Procedure: KNEE INJECTION;  Surgeon: Melina Schools, MD;  Location: [place];  Service: Orthopedics;  Laterality: Right;  . Lumbar wound debridement N/A 12/01/2014    Procedure: IRRIGATION AND DEBRIDEMENT OF LUMBAR WOUND WITH VAC PLACEMENT;  Surgeon: Melina Schools, MD;  Location: [place];  Service: Orthopedics;  Laterality: N/A;  .  Lumbar wound debridement N/A 12/03/2014    Procedure: WOUND VAC DRESSING CHANGE ;  Surgeon: Melina Schools, MD;  Location: [place];  Service: Orthopedics;  Laterality: N/A;   Current Outpatient Prescriptions on File Prior to Visit  Medication Sig Dispense Refill  . ALPRAZolam (XANAX) 1 MG tablet Take 1 mg by mouth 3 (three) times daily as needed for anxiety.     Marland Kitchen amLODipine (NORVASC) 10 MG tablet Take 10 mg by mouth daily.  0  . aspirin EC 81 MG tablet Take 1  tablet (81 mg total) by mouth daily. Start on POD #2 30 tablet 2  . ceFAZolin (ANCEF) 2-3 GM-% SOLR Inject 50 mLs (2 g total) into the vein every 8 (eight) hours. 68 each 0  . docusate sodium (COLACE) 100 MG capsule Take 1 capsule (100 mg total) by mouth 3 (three) times daily as needed for mild constipation. 30 capsule 0  . HYDROcodone-acetaminophen (NORCO) 10-325 MG per tablet Take 1 tablet by mouth every 6 (six) hours as needed. 60 tablet 0  . levothyroxine (SYNTHROID, LEVOTHROID) 100 MCG tablet Take 100 mcg by mouth every morning.   0  . lisinopril-hydrochlorothiazide (PRINZIDE,ZESTORETIC) 20-12.5 MG per tablet Take 1 tablet by mouth 2 (two) times daily.    . methocarbamol (ROBAXIN) 500 MG tablet Take 1 tablet (500 mg total) by mouth 3 (three) times daily as needed for muscle spasms. 60 tablet 0  . ondansetron (ZOFRAN) 4 MG tablet Take 1 tablet (4 mg total) by mouth every 8 (eight) hours as needed for nausea or vomiting. 20 tablet 0    Social; patient lives in [place]. She is a functional independent lady who lives with her husband.  Family history; remarkable positive family history of breast cancer  Review of systems Musculoskeletal rates her back pain at constantly 8 out of 10 no radiation to her legs no bowel or bladder issues GU no urinary difficulties GI no bowel difficulties.  Physical examination Gen. the patient looks well other than the pain. She is not systemically unwell. Respiratory clear entry bilaterally Cardiac heart sounds are normal no murmurs she appears to be euvolemic PICC line in her right arm. Lower back. There is still a fairly extensive wound here in the lumbar area. Fortunately there is healthy-looking granulation. There is no exposed bone however the wound probes superiorly roughly 1 inch. There is nothing that looked like it needed culturing. There is tenderness on both sides of the wound  Impression/plan #1 surgical wound nonhealing. This is an  extensive wound and will take probably 2 months of the wound VAC to gain closure assuming that the infection is controlled. CNS of this grew methicillin sensitive staph aureus she is on Ancef through July 19 as directed by Dr.Comer of infectious disease. #2 methicillin sensitive staph aureus infection. There was nothing looking at the wound that may me think that this is not the under adequate control a. I see no relevant imaging tests. #3 poorly controlled pain. The patient is on hydrocodone 50m I'll try to augment this to get her some relief. There is no issue here that makes me concerned about the radicular pain or other element concerns. She is already up walking  I have written orders for the duration of her Ancef through July 19. If she is to go home this will need to be arranged through home health. This takes some time to arrange after reviewing her insurance etc. If she is going home with a wound VAC  then that can be arranged for home health and could be followed in the wound care clinic if that's satisfactory with her surgeon. Follow-up CBC with differential CMP and C reactive protein although the C reactive protein was recently checked at 3.8.   Results for DASHANAE, LONGFIELD (MRN [id number]) as of 12/10/2014 09:38  Ref. Range 12/01/2014 21:44 12/01/2014 21:55 12/02/2014 07:26 12/05/2014 05:16 12/08/2014 08:30  Sodium Latest Ref Range: 135-145 mmol/L  131 (L) 133 (L) 140   Potassium Latest Ref Range: 3.5-5.1 mmol/L  3.0 (L) 3.1 (L) 3.7   Chloride Latest Ref Range: 101-111 mmol/L  94 (L) 95 (L) 92 (L)   CO2 Latest Ref Range: 22-32 mmol/L  29 32 38 (H)   BUN Latest Ref Range: 6-20 mg/dL  17 14 7    Creatinine Latest Ref Range: 0.44-1.00 mg/dL  1.10 (H) 0.96 0.71   Calcium Latest Ref Range: 8.9-10.3 mg/dL  9.1 8.8 (L) 8.9   EGFR (Non-African Amer.) Latest Ref Range: >60 mL/min  50 (L) 59 (L) >60   EGFR (African American) Latest Ref Range: >60 mL/min  58 (L) >60 >60   Glucose Latest Ref Range: 65-99  mg/dL  108 (H) 112 (H) 92   Anion gap Latest Ref Range: 5-15   8 6 10    CRP Latest Ref Range: <1.0 mg/dL     3.8 (H)  WBC Latest Ref Range: 4.0-10.5 K/uL  11.1 (H) 9.5  7.2  RBC Latest Ref Range: 3.87-5.11 MIL/uL  3.08 (L) 3.02 (L)  3.20 (L)  Hemoglobin Latest Ref Range: 12.0-15.0 g/dL  9.6 (L) 9.2 (L)  9.8 (L)  HCT Latest Ref Range: 36.0-46.0 %  28.6 (L) 28.0 (L)  30.2 (L)  MCV Latest Ref Range: 78.0-100.0 fL  92.9 92.7  94.4  MCH Latest Ref Range: 26.0-34.0 pg  31.2 30.5  30.6  MCHC Latest Ref Range: 30.0-36.0 g/dL  33.6 32.9  32.5  RDW Latest Ref Range: 11.5-15.5 %  13.7 13.6  14.3  Platelets Latest Ref Range: 150-400 K/uL  227 242  464 (H)  Neutrophils Latest Ref Range: 43-77 %  75   55  Lymphocytes Latest Ref Range: 12-46 %  11 (L)   30  Monocytes Relative Latest Ref Range: 3-12 %  13 (H)   7  Eosinophil Latest Ref Range: 0-5 %  1   7 (H)  Basophil Latest Ref Range: 0-1 %  0   1  NEUT# Latest Ref Range: 1.7-7.7 K/uL  8.3 (H)   4.1  Lymphocyte # Latest Ref Range: 0.7-4.0 K/uL  1.3   2.2  Monocyte # Latest Ref Range: 0.1-1.0 K/uL  1.5 (H)   0.5  Eosinophils Absolute Latest Ref Range: 0.0-0.7 K/uL  0.1   0.5  Basophils Absolute Latest Ref Range: 0.0-0.1 K/uL  0.0   0.0  URINE CULTURE Unknown Rpt

## 2014-12-10 NOTE — Telephone Encounter (Signed)
[facility] 

## 2014-12-11 ENCOUNTER — Ambulatory Visit (HOSPITAL_COMMUNITY): Payer: Medicare Other | Attending: Internal Medicine

## 2014-12-11 DIAGNOSIS — Z452 Encounter for adjustment and management of vascular access device: Secondary | ICD-10-CM | POA: Diagnosis not present

## 2014-12-11 DIAGNOSIS — Z5189 Encounter for other specified aftercare: Secondary | ICD-10-CM | POA: Diagnosis not present

## 2014-12-12 NOTE — Addendum Note (Signed)
Addendum  created 12/12/14 1827 by Rica Koyanagi, MD   Modules edited: Notes Section   Notes Section:  File: [id number]

## 2014-12-12 NOTE — Anesthesia Postprocedure Evaluation (Signed)
  Anesthesia Post-op Note  Patient: Karen [name]  Procedure(s) Performed: Procedure(s): WOUND VAC DRESSING CHANGE  (N/A)  Patient Location: PACU  Anesthesia Type:General  Level of Consciousness: awake and alert   Airway and Oxygen Therapy: Patient Spontanous Breathing  Post-op Pain: mild  Post-op Assessment: Post-op Vital signs reviewed LLE Motor Response: Purposeful movement, Responds to commands LLE Sensation: Full sensation RLE Motor Response: Purposeful movement, Responds to commands RLE Sensation: Full sensation      Post-op Vital Signs: stable  Last Vitals:  Filed Vitals:   12/09/14 0517  BP: 138/76  Pulse: 73  Temp: 36.6 C  Resp: 18    Complications: No apparent anesthesia complications

## 2014-12-16 ENCOUNTER — Encounter (HOSPITAL_COMMUNITY): Payer: Self-pay | Admitting: *Deleted

## 2014-12-16 ENCOUNTER — Other Ambulatory Visit (HOSPITAL_COMMUNITY)
Admission: AD | Admit: 2014-12-16 | Discharge: 2014-12-16 | Disposition: A | Discharge status: Home / Self Care | Payer: Medicare Other | Source: Skilled Nursing Facility | Attending: Internal Medicine | Admitting: Internal Medicine

## 2014-12-16 DIAGNOSIS — D500 Iron deficiency anemia secondary to blood loss (chronic): Secondary | ICD-10-CM | POA: Insufficient documentation

## 2014-12-16 DIAGNOSIS — I10 Essential (primary) hypertension: Secondary | ICD-10-CM | POA: Insufficient documentation

## 2014-12-16 LAB — CBC WITH DIFFERENTIAL/PLATELET
BASOS PCT: 1 % (ref 0–1)
Basophils Absolute: 0 10*3/uL (ref 0.0–0.1)
EOS ABS: 0.4 10*3/uL (ref 0.0–0.7)
Eosinophils Relative: 6 % — ABNORMAL HIGH (ref 0–5)
HEMATOCRIT: 29.7 % — AB (ref 36.0–46.0)
Hemoglobin: 9.5 g/dL — ABNORMAL LOW (ref 12.0–15.0)
Lymphocytes Relative: 38 % (ref 12–46)
Lymphs Abs: 2.3 10*3/uL (ref 0.7–4.0)
MCH: 31 pg (ref 26.0–34.0)
MCHC: 32 g/dL (ref 30.0–36.0)
MCV: 97.1 fL (ref 78.0–100.0)
MONO ABS: 0.5 10*3/uL (ref 0.1–1.0)
MONOS PCT: 9 % (ref 3–12)
Neutro Abs: 2.8 10*3/uL (ref 1.7–7.7)
Neutrophils Relative %: 46 % (ref 43–77)
Platelets: 364 10*3/uL (ref 150–400)
RBC: 3.06 MIL/uL — ABNORMAL LOW (ref 3.87–5.11)
RDW: 15 % (ref 11.5–15.5)
WBC: 6.1 10*3/uL (ref 4.0–10.5)

## 2014-12-16 LAB — COMPREHENSIVE METABOLIC PANEL
ALT: 5 U/L — ABNORMAL LOW (ref 14–54)
ANION GAP: 8 (ref 5–15)
AST: 17 U/L (ref 15–41)
Albumin: 3.2 g/dL — ABNORMAL LOW (ref 3.5–5.0)
Alkaline Phosphatase: 75 U/L (ref 38–126)
BILIRUBIN TOTAL: 0.3 mg/dL (ref 0.3–1.2)
BUN: 18 mg/dL (ref 6–20)
CALCIUM: 9.1 mg/dL (ref 8.9–10.3)
CO2: 31 mmol/L (ref 22–32)
CREATININE: 0.67 mg/dL (ref 0.44–1.00)
Chloride: 102 mmol/L (ref 101–111)
GFR calc non Af Amer: >60 mL/min (ref >60)
Glucose, Bld: 89 mg/dL (ref 65–99)
Potassium: 3.7 mmol/L (ref 3.5–5.1)
Sodium: 141 mmol/L (ref 135–145)
Total Protein: 6.6 g/dL (ref 6.5–8.1)

## 2014-12-16 LAB — C-REACTIVE PROTEIN: CRP: 1 mg/dL — AB (ref <1.0)

## 2014-12-17 ENCOUNTER — Observation Stay (HOSPITAL_COMMUNITY)
Admission: RE | Admit: 2014-12-17 | Discharge: 2014-12-18 | Disposition: A | Discharge status: Home / Self Care | Payer: Medicare Other | Source: Ambulatory Visit | Attending: Orthopedic Surgery | Admitting: Orthopedic Surgery

## 2014-12-17 ENCOUNTER — Encounter (HOSPITAL_COMMUNITY): Payer: Self-pay | Admitting: Anesthesiology

## 2014-12-17 ENCOUNTER — Ambulatory Visit (HOSPITAL_COMMUNITY): Payer: Medicare Other | Admitting: Anesthesiology

## 2014-12-17 ENCOUNTER — Encounter (HOSPITAL_COMMUNITY)
Admission: RE | Disposition: A | Discharge status: Home / Self Care | Payer: Self-pay | Source: Ambulatory Visit | Attending: Orthopedic Surgery

## 2014-12-17 ENCOUNTER — Encounter: Payer: Self-pay | Admitting: Internal Medicine

## 2014-12-17 DIAGNOSIS — Z481 Encounter for planned postprocedural wound closure: Secondary | ICD-10-CM | POA: Diagnosis not present

## 2014-12-17 DIAGNOSIS — Z853 Personal history of malignant neoplasm of breast: Secondary | ICD-10-CM | POA: Diagnosis not present

## 2014-12-17 DIAGNOSIS — I10 Essential (primary) hypertension: Secondary | ICD-10-CM | POA: Diagnosis not present

## 2014-12-17 DIAGNOSIS — Z882 Allergy status to sulfonamides status: Secondary | ICD-10-CM | POA: Diagnosis not present

## 2014-12-17 DIAGNOSIS — E039 Hypothyroidism, unspecified: Secondary | ICD-10-CM | POA: Diagnosis not present

## 2014-12-17 DIAGNOSIS — K219 Gastro-esophageal reflux disease without esophagitis: Secondary | ICD-10-CM | POA: Diagnosis not present

## 2014-12-17 DIAGNOSIS — Y92234 Operating room of hospital as the place of occurrence of the external cause: Secondary | ICD-10-CM | POA: Insufficient documentation

## 2014-12-17 DIAGNOSIS — Z79899 Other long term (current) drug therapy: Secondary | ICD-10-CM | POA: Diagnosis not present

## 2014-12-17 DIAGNOSIS — T814XXA Infection following a procedure, initial encounter: Principal | ICD-10-CM | POA: Insufficient documentation

## 2014-12-17 DIAGNOSIS — Z885 Allergy status to narcotic agent status: Secondary | ICD-10-CM | POA: Diagnosis not present

## 2014-12-17 DIAGNOSIS — G4730 Sleep apnea, unspecified: Secondary | ICD-10-CM | POA: Diagnosis not present

## 2014-12-17 DIAGNOSIS — L089 Local infection of the skin and subcutaneous tissue, unspecified: Secondary | ICD-10-CM | POA: Diagnosis present

## 2014-12-17 DIAGNOSIS — Y838 Other surgical procedures as the cause of abnormal reaction of the patient, or of later complication, without mention of misadventure at the time of the procedure: Secondary | ICD-10-CM | POA: Insufficient documentation

## 2014-12-17 DIAGNOSIS — Z87891 Personal history of nicotine dependence: Secondary | ICD-10-CM | POA: Diagnosis not present

## 2014-12-17 DIAGNOSIS — K6811 Postprocedural retroperitoneal abscess: Secondary | ICD-10-CM | POA: Diagnosis not present

## 2014-12-17 DIAGNOSIS — T148XXA Other injury of unspecified body region, initial encounter: Secondary | ICD-10-CM

## 2014-12-17 HISTORY — PX: DEBRIDEMENT AND CLOSURE WOUND: SHX5614

## 2014-12-17 SURGERY — DEBRIDEMENT, WOUND, WITH CLOSURE
Anesthesia: General | Site: Back

## 2014-12-17 MED ORDER — PROMETHAZINE HCL 25 MG/ML IJ SOLN: 6.2500 mg | INTRAMUSCULAR | Status: DC | PRN

## 2014-12-17 MED ORDER — LIDOCAINE HCL (CARDIAC) 20 MG/ML IV SOLN
INTRAVENOUS | Status: DC | PRN
  Administered 2014-12-17: 80 mg via INTRAVENOUS

## 2014-12-17 MED ORDER — OXYCODONE HCL ER 15 MG PO T12A
15.0000 mg | EXTENDED_RELEASE_TABLET | Freq: Two times a day (BID) | ORAL | Status: DC
  Administered 2014-12-17 – 2014-12-18 (×2): 15 mg via ORAL
  Filled 2014-12-17 (×2): qty 1

## 2014-12-17 MED ORDER — ONDANSETRON HCL 4 MG PO TABS
4.0000 mg | ORAL_TABLET | Freq: Three times a day (TID) | ORAL | Status: DC | PRN
  Administered 2014-12-17: 4 mg via ORAL
  Filled 2014-12-17: qty 1

## 2014-12-17 MED ORDER — LACTATED RINGERS IV SOLN
INTRAVENOUS | Status: DC
  Administered 2014-12-17: 85 mL/h via INTRAVENOUS

## 2014-12-17 MED ORDER — HYDROCODONE-ACETAMINOPHEN 10-325 MG PO TABS
1.0000 | ORAL_TABLET | Freq: Four times a day (QID) | ORAL | Status: DC | PRN
  Administered 2014-12-18 (×2): 1 via ORAL
  Filled 2014-12-17 (×2): qty 1

## 2014-12-17 MED ORDER — CEFAZOLIN SODIUM-DEXTROSE 2-3 GM-% IV SOLR
INTRAVENOUS | Status: DC | PRN
  Administered 2014-12-17: 2 g via INTRAVENOUS

## 2014-12-17 MED ORDER — MIDAZOLAM HCL 2 MG/2ML IJ SOLN
INTRAMUSCULAR | Status: AC
  Filled 2014-12-17: qty 2

## 2014-12-17 MED ORDER — SUCCINYLCHOLINE CHLORIDE 20 MG/ML IJ SOLN
INTRAMUSCULAR | Status: AC
  Filled 2014-12-17: qty 1

## 2014-12-17 MED ORDER — CEFAZOLIN SODIUM-DEXTROSE 2-3 GM-% IV SOLR
2.0000 g | Freq: Three times a day (TID) | INTRAVENOUS | Status: DC
  Administered 2014-12-17 – 2014-12-18 (×2): 2 g via INTRAVENOUS
  Filled 2014-12-17 (×4): qty 50

## 2014-12-17 MED ORDER — VANCOMYCIN HCL 500 MG IV SOLR
INTRAVENOUS | Status: DC | PRN
  Administered 2014-12-17: 500 mg

## 2014-12-17 MED ORDER — ALPRAZOLAM 0.5 MG PO TABS
1.0000 mg | ORAL_TABLET | Freq: Three times a day (TID) | ORAL | Status: DC | PRN
  Administered 2014-12-17: 1 mg via ORAL
  Filled 2014-12-17: qty 2

## 2014-12-17 MED ORDER — SODIUM CHLORIDE 0.9 % IJ SOLN: 3.0000 mL | INTRAMUSCULAR | Status: DC | PRN

## 2014-12-17 MED ORDER — PHENOL 1.4 % MT LIQD: 1.0000 | OROMUCOSAL | Status: DC | PRN

## 2014-12-17 MED ORDER — HYDROMORPHONE HCL 1 MG/ML IJ SOLN
INTRAMUSCULAR | Status: AC
  Filled 2014-12-17: qty 1

## 2014-12-17 MED ORDER — LEVOTHYROXINE SODIUM 100 MCG PO TABS
100.0000 ug | ORAL_TABLET | Freq: Every day | ORAL | Status: DC
Start: 2014-12-18 — End: 2014-12-18
  Administered 2014-12-18: 100 ug via ORAL
  Filled 2014-12-17 (×2): qty 1

## 2014-12-17 MED ORDER — METHOCARBAMOL 500 MG PO TABS
500.0000 mg | ORAL_TABLET | Freq: Three times a day (TID) | ORAL | Status: DC | PRN
  Administered 2014-12-17 – 2014-12-18 (×2): 500 mg via ORAL
  Filled 2014-12-17: qty 1

## 2014-12-17 MED ORDER — FENTANYL CITRATE (PF) 250 MCG/5ML IJ SOLN
INTRAMUSCULAR | Status: AC
  Filled 2014-12-17: qty 5

## 2014-12-17 MED ORDER — LISINOPRIL 20 MG PO TABS
20.0000 mg | ORAL_TABLET | Freq: Every day | ORAL | Status: DC
  Administered 2014-12-18: 20 mg via ORAL
  Filled 2014-12-17: qty 1

## 2014-12-17 MED ORDER — VANCOMYCIN HCL 500 MG IV SOLR
INTRAVENOUS | Status: AC
  Filled 2014-12-17: qty 500

## 2014-12-17 MED ORDER — HYDROMORPHONE HCL 1 MG/ML IJ SOLN
0.5000 mg | Freq: Once | INTRAMUSCULAR | Status: AC
  Administered 2014-12-17: 0.5 mg via INTRAVENOUS

## 2014-12-17 MED ORDER — MENTHOL 3 MG MT LOZG: 1.0000 | LOZENGE | OROMUCOSAL | Status: DC | PRN

## 2014-12-17 MED ORDER — ONDANSETRON HCL 4 MG/2ML IJ SOLN
INTRAMUSCULAR | Status: AC
  Filled 2014-12-17: qty 2

## 2014-12-17 MED ORDER — SODIUM CHLORIDE 0.9 % IJ SOLN: 3.0000 mL | Freq: Two times a day (BID) | INTRAMUSCULAR | Status: DC

## 2014-12-17 MED ORDER — PROPOFOL 10 MG/ML IV BOLUS
INTRAVENOUS | Status: AC
  Filled 2014-12-17: qty 20

## 2014-12-17 MED ORDER — 0.9 % SODIUM CHLORIDE (POUR BTL) OPTIME
TOPICAL | Status: DC | PRN
  Administered 2014-12-17: 1000 mL

## 2014-12-17 MED ORDER — ONDANSETRON HCL 4 MG/2ML IJ SOLN
INTRAMUSCULAR | Status: DC | PRN
  Administered 2014-12-17: 4 mg via INTRAVENOUS

## 2014-12-17 MED ORDER — SODIUM CHLORIDE 0.9 % IV SOLN: 250.0000 mL | INTRAVENOUS | Status: DC

## 2014-12-17 MED ORDER — MIDAZOLAM HCL 5 MG/5ML IJ SOLN
INTRAMUSCULAR | Status: DC | PRN
  Administered 2014-12-17: 1 mg via INTRAVENOUS

## 2014-12-17 MED ORDER — HYDROCHLOROTHIAZIDE 12.5 MG PO CAPS
12.5000 mg | ORAL_CAPSULE | Freq: Every day | ORAL | Status: DC
  Administered 2014-12-18: 12.5 mg via ORAL
  Filled 2014-12-17: qty 1

## 2014-12-17 MED ORDER — LACTATED RINGERS IV SOLN
INTRAVENOUS | Status: DC
  Administered 2014-12-17: 15:00:00 via INTRAVENOUS

## 2014-12-17 MED ORDER — HYDROMORPHONE HCL 1 MG/ML IJ SOLN
0.2500 mg | INTRAMUSCULAR | Status: DC | PRN
  Administered 2014-12-17 (×4): 0.5 mg via INTRAVENOUS

## 2014-12-17 MED ORDER — METHOCARBAMOL 500 MG PO TABS
ORAL_TABLET | ORAL | Status: AC
  Filled 2014-12-17: qty 1

## 2014-12-17 MED ORDER — ARTIFICIAL TEARS OP OINT
TOPICAL_OINTMENT | OPHTHALMIC | Status: AC
  Filled 2014-12-17: qty 3.5

## 2014-12-17 MED ORDER — LISINOPRIL-HYDROCHLOROTHIAZIDE 20-12.5 MG PO TABS: 1.0000 | ORAL_TABLET | Freq: Two times a day (BID) | ORAL | Status: DC

## 2014-12-17 MED ORDER — DOCUSATE SODIUM 100 MG PO CAPS: 100.0000 mg | ORAL_CAPSULE | Freq: Three times a day (TID) | ORAL | Status: DC | PRN

## 2014-12-17 MED ORDER — FENTANYL CITRATE (PF) 100 MCG/2ML IJ SOLN
INTRAMUSCULAR | Status: DC | PRN
  Administered 2014-12-17 (×2): 50 ug via INTRAVENOUS
  Administered 2014-12-17: 100 ug via INTRAVENOUS
  Administered 2014-12-17: 50 ug via INTRAVENOUS

## 2014-12-17 MED ORDER — AMLODIPINE BESYLATE 10 MG PO TABS
10.0000 mg | ORAL_TABLET | Freq: Every day | ORAL | Status: DC
  Administered 2014-12-18: 10 mg via ORAL
  Filled 2014-12-17: qty 1

## 2014-12-17 MED ORDER — BUPIVACAINE-EPINEPHRINE (PF) 0.25% -1:200000 IJ SOLN
INTRAMUSCULAR | Status: AC
  Filled 2014-12-17: qty 30

## 2014-12-17 MED ORDER — SUCCINYLCHOLINE CHLORIDE 20 MG/ML IJ SOLN
INTRAMUSCULAR | Status: DC | PRN
  Administered 2014-12-17: 90 mg via INTRAVENOUS

## 2014-12-17 MED ORDER — PROPOFOL 10 MG/ML IV BOLUS
INTRAVENOUS | Status: DC | PRN
  Administered 2014-12-17: 160 mg via INTRAVENOUS

## 2014-12-17 MED ORDER — SODIUM CHLORIDE 0.9 % IR SOLN
Status: DC | PRN
  Administered 2014-12-17: 3000 mL

## 2014-12-17 SURGICAL SUPPLY — 59 items
BUR EGG ELITE 4.0 (BURR) IMPLANT
CANISTER SUCTION 2500CC (MISCELLANEOUS) IMPLANT
CORDS BIPOLAR (ELECTRODE) ×2 IMPLANT
COVER SURGICAL LIGHT HANDLE (MISCELLANEOUS) ×2 IMPLANT
DRAPE POUCH INSTRU U-SHP 10X18 (DRAPES) ×2 IMPLANT
DRAPE PROXIMA HALF (DRAPES) ×2 IMPLANT
DRAPE SURG 17X23 STRL (DRAPES) ×2 IMPLANT
DRAPE U-SHAPE 47X51 STRL (DRAPES) ×2 IMPLANT
DRSG MEPILEX BORDER 4X8 (GAUZE/BANDAGES/DRESSINGS) IMPLANT
DURAPREP 26ML APPLICATOR (WOUND CARE) IMPLANT
ELECT BLADE 4.0 EZ CLEAN MEGAD (MISCELLANEOUS)
ELECT CAUTERY BLADE 6.4 (BLADE) IMPLANT
ELECT PENCIL ROCKER SW 15FT (MISCELLANEOUS) IMPLANT
ELECT REM PT RETURN 9FT ADLT (ELECTROSURGICAL) ×2
ELECTRODE BLDE 4.0 EZ CLN MEGD (MISCELLANEOUS) IMPLANT
ELECTRODE REM PT RTRN 9FT ADLT (ELECTROSURGICAL) ×1 IMPLANT
EVACUATOR 1/8 PVC DRAIN (DRAIN) IMPLANT
GAUZE SPONGE 4X4 12PLY STRL (GAUZE/BANDAGES/DRESSINGS) ×2 IMPLANT
GLOVE BIOGEL PI IND STRL 8 (GLOVE) ×1 IMPLANT
GLOVE BIOGEL PI IND STRL 8.5 (GLOVE) ×1 IMPLANT
GLOVE BIOGEL PI INDICATOR 8 (GLOVE) ×1
GLOVE BIOGEL PI INDICATOR 8.5 (GLOVE) ×1
GLOVE ORTHO TXT STRL SZ7.5 (GLOVE) ×2 IMPLANT
GLOVE SS BIOGEL STRL SZ 8.5 (GLOVE) ×1 IMPLANT
GLOVE SUPERSENSE BIOGEL SZ 8.5 (GLOVE) ×1
GOWN STRL REUS W/ TWL LRG LVL3 (GOWN DISPOSABLE) ×1 IMPLANT
GOWN STRL REUS W/TWL 2XL LVL3 (GOWN DISPOSABLE) ×4 IMPLANT
GOWN STRL REUS W/TWL LRG LVL3 (GOWN DISPOSABLE) ×1
KIT BASIN OR (CUSTOM PROCEDURE TRAY) ×2 IMPLANT
KIT PREVENA INCISION MGT20CM45 (CANNISTER) ×2 IMPLANT
KIT ROOM TURNOVER OR (KITS) ×2 IMPLANT
KIT STIMULAN RAPID CURE 5CC (Orthopedic Implant) ×2 IMPLANT
NEEDLE 22X1 1/2 (OR ONLY) (NEEDLE) ×2 IMPLANT
NEEDLE SPNL 18GX3.5 QUINCKE PK (NEEDLE) IMPLANT
NS IRRIG 1000ML POUR BTL (IV SOLUTION) ×2 IMPLANT
PACK LAMINECTOMY ORTHO (CUSTOM PROCEDURE TRAY) ×2 IMPLANT
PACK UNIVERSAL I (CUSTOM PROCEDURE TRAY) ×2 IMPLANT
PAD ARMBOARD 7.5X6 YLW CONV (MISCELLANEOUS) ×4 IMPLANT
PATTIES SURGICAL .5 X.5 (GAUZE/BANDAGES/DRESSINGS) IMPLANT
PATTIES SURGICAL .5 X1 (DISPOSABLE) IMPLANT
SPONGE SURGIFOAM ABS GEL 100 (HEMOSTASIS) IMPLANT
STRIP CLOSURE SKIN 1/2X4 (GAUZE/BANDAGES/DRESSINGS) IMPLANT
SURGIFLO TRUKIT (HEMOSTASIS) IMPLANT
SUT BONE WAX W31G (SUTURE) IMPLANT
SUT MON AB 3-0 SH 27 (SUTURE)
SUT MON AB 3-0 SH27 (SUTURE) IMPLANT
SUT PDS AB 1 CT  36 (SUTURE) ×3
SUT PDS AB 1 CT 36 (SUTURE) ×3 IMPLANT
SUT VIC AB 0 CT1 27 (SUTURE)
SUT VIC AB 0 CT1 27XBRD ANBCTR (SUTURE) IMPLANT
SUT VIC AB 1 CTX 36 (SUTURE)
SUT VIC AB 1 CTX36XBRD ANBCTR (SUTURE) IMPLANT
SUT VIC AB 2-0 CT1 18 (SUTURE) IMPLANT
SYR BULB IRRIGATION 50ML (SYRINGE) ×2 IMPLANT
SYR CONTROL 10ML LL (SYRINGE) IMPLANT
TOWEL OR 17X24 6PK STRL BLUE (TOWEL DISPOSABLE) ×2 IMPLANT
TOWEL OR 17X26 10 PK STRL BLUE (TOWEL DISPOSABLE) ×2 IMPLANT
WATER STERILE IRR 1000ML POUR (IV SOLUTION) IMPLANT
YANKAUER SUCT BULB TIP NO VENT (SUCTIONS) ×2 IMPLANT

## 2014-12-17 NOTE — Progress Notes (Signed)
This encounter was created in error - please disregard.

## 2014-12-17 NOTE — Anesthesia Preprocedure Evaluation (Signed)
Anesthesia Evaluation  Patient identified by MRN, date of birth, ID band Patient awake  General Assessment Comment:Febrile   Reviewed: Allergy & Precautions, NPO status   History of Anesthesia Complications Negative for: history of anesthetic complications  Airway Mallampati: II   Neck ROM: Full  Mouth opening: Limited Mouth Opening  Dental  (+) Dental Advisory Given, Poor Dentition, Missing   Pulmonary sleep apnea , former smoker,  breath sounds clear to auscultation        Cardiovascular hypertension, Pt. on medications Rhythm:Regular Rate:Normal     Neuro/Psych    GI/Hepatic GERD-  Medicated and Controlled,  Endo/Other  Hypothyroidism   Renal/GU      Musculoskeletal   Abdominal   Peds  Hematology   Anesthesia Other Findings   Reproductive/Obstetrics                             Anesthesia Physical Anesthesia Plan  ASA: II  Anesthesia Plan: General   Post-op Pain Management:    Induction: Intravenous  Airway Management Planned: Oral ETT  Additional Equipment:   Intra-op Plan:   Post-operative Plan: Extubation in OR  Informed Consent: I have reviewed the patients History and Physical, chart, labs and discussed the procedure including the risks, benefits and alternatives for the proposed anesthesia with the patient or authorized representative who has indicated his/her understanding and acceptance.   Dental advisory given  Plan Discussed with:   Anesthesia Plan Comments:         Anesthesia Quick Evaluation

## 2014-12-17 NOTE — Transfer of Care (Signed)
Immediate Anesthesia Transfer of Care Note  Patient: Karen [name]  Procedure(s) Performed: Procedure(s): CLOSURE WOUND LUMBAR SPINE (N/A)  Patient Location: PACU  Anesthesia Type:General  Level of Consciousness: awake and oriented  Airway & Oxygen Therapy: Patient Spontanous Breathing and Patient connected to nasal cannula oxygen  Post-op Assessment: Report given to RN, Post -op Vital signs reviewed and stable and Patient moving all extremities  Post vital signs: Reviewed and stable  Last Vitals:  Filed Vitals:   12/17/14 1412  BP: 140/84  Pulse: 72  Temp: 37.2 C  Resp: 18    Complications: No apparent anesthesia complications

## 2014-12-17 NOTE — Anesthesia Procedure Notes (Signed)
Procedure Name: Intubation Date/Time: 12/17/2014 4:14 PM Performed by: Terrill Mohr Pre-anesthesia Checklist: Emergency Drugs available, Patient identified, Suction available and Patient being monitored Patient Re-evaluated:Patient Re-evaluated prior to inductionOxygen Delivery Method: Circle system utilized Preoxygenation: Pre-oxygenation with 100% oxygen Intubation Type: IV induction Ventilation: Mask ventilation without difficulty Laryngoscope Size: Mac and 3 Grade View: Grade II Tube type: Oral Tube size: 7.5 mm Number of attempts: 1 Airway Equipment and Method: Stylet Placement Confirmation: ETT inserted through vocal cords under direct vision,  breath sounds checked- equal and bilateral and positive ETCO2 Secured at: 21 (cm at teeth) cm Tube secured with: Tape Dental Injury: Teeth and Oropharynx as per pre-operative assessment

## 2014-12-17 NOTE — Progress Notes (Signed)
Okay to use PICC per Dr. Orene Desanctis, blood return noted and flushed without difficulty.

## 2014-12-17 NOTE — H&P (Signed)
History of Present Illness   The patient is a 69 year old female presenting for a post-operative visit. Patient is 9 days (She is currently staying at the [facility] in [place]) postop following their lumbar s/p Repea Washout of Spine wound with application of wound vac on 12/03/14.Overall the patient feels that they are better. Post operative pain has been moderate. The patient does report pain (rating pain at 5-6/10) The patient does indicate that these symptoms are improving. Pain medications include: Percocet . The patient feels they are having no difficulty tolerating their pain medication(s). The patient is currently 100 percent weightbearing without the use of assistive devices.  Subjective Transcription She returns today for followup. The VAC dressing was changed today by myself. It is actually clean and dry. There is no drainage. There is no erythema. There is no purulent odor. I am very pleased with the healing thus far. She is getting her twice a day. She is having no fevers or chills. Overall, I am pleased with her recovery. I do think that Wednesday of next week, we will be able to do a primary closure of the wound. I will plan on having her transported from [facility] to [facility] on Wednesday morning, so that Wednesday afternoon we can do the wound closure and then I will send her home most likely on Thursday. She will need to continue the IV antibiotic therapy, so we will make sure to set up a home health nursing visit. At this point, I am going to put a stop to the formal PT as it is actually causing her more pain and discomfort. At this point, I just want her walking and doing her lower extremity stretches while the wound heals.    Allergies Sulfa Antibiotics Morphine Derivatives  Family History  Cancer mother and sister Congestive Heart Failure father Hypertension mother and brother Osteoarthritis mother  Social History Tobacco use former smoker Tobacco /  smoke exposure no Alcohol use never consumed alcohol Children 3 Current work status retired Engineer, agricultural (Currently) no Drug/Alcohol Rehab (Previously) no Illicit drug use no Marital status married Living situation live with spouse Pain Contract no  Medication History  Hydrocodone-Acetaminophen (10-325MG  Tablet, Oral) Active. Methocarbamol (500MG  Tablet, Oral) Active. Ondansetron HCl (4MG  Tablet, Oral) Active. (prn) Lisinopril (Oral) Specific dose unknown - Active. Lisinopril-Hydrochlorothiazide (20-12.5MG  Tablet, Oral) Active. (qd) AmLODIPine Besylate (10MG  Tablet, Oral) Active. (bid) Hydrocodone-Acetaminophen (5-300MG  Tablet, Oral) Active. (prn Rx'd by PCP) Levothyroxine Sodium (100MCG Tablet, Oral) Active. (qd) Hydrocodone (prn, given by Inetta Fermo) Active. Ibuprofen (200MG  Tablet, Oral) Active. ALPRAZolam (1MG  Tablet, Oral) Active. (tid) Medications Reconciled  Exam: No SOB/CP ABD Soft/NT.  No incontinence of B/B 2+ DP/PT pulses Compartments soft/NT Wound: granulation tissue formation.  No purulent drainage  Wound edges healing well No fevers/chills  Assessment & Plan Patient s/p wound infection  S/P washout x2 and VAC drain application Wound healing well.  Plan on primary wound closure today.  Will also place abx beads as further precaution from recurrent infection All risks/benefits explained to patient .  All questions addressed.

## 2014-12-17 NOTE — Brief Op Note (Signed)
12/17/2014  5:02 PM  PATIENT:  Karen [name]  70 y.o. female  PRE-OPERATIVE DIAGNOSIS:  wound infection lumbar spine  POST-OPERATIVE DIAGNOSIS:  wound infection lumbar spine  PROCEDURE:  Procedure(s): CLOSURE WOUND LUMBAR SPINE (N/A)  SURGEON:  Surgeon(s) and Role:    * Melina Schools, MD - Primary  PHYSICIAN ASSISTANT:   ASSISTANTS: carmen Mayo   ANESTHESIA:   general  EBL:     BLOOD ADMINISTERED:none  DRAINS: none   LOCAL MEDICATIONS USED:  NONE  SPECIMEN:  No Specimen  DISPOSITION OF SPECIMEN:  N/A  COUNTS:  YES  TOURNIQUET:  * No tourniquets in log *  DICTATION: .Other Dictation: Dictation Number [id number]  PLAN OF CARE: Admit for overnight observation  PATIENT DISPOSITION:  PACU - hemodynamically stable.

## 2014-12-17 NOTE — Progress Notes (Signed)
   12/17/14 1432  OBSTRUCTIVE SLEEP APNEA  Have you ever been diagnosed with sleep apnea through a sleep study? No  Do you snore loudly (loud enough to be heard through closed doors)?  1  Do you often feel tired, fatigued, or sleepy during the daytime? 0  Has anyone observed you stop breathing during your sleep? 1  Do you have, or are you being treated for high blood pressure? 1  BMI more than 35 kg/m2? 0  Age over 69 years old? 1  Neck circumference greater than 40 cm/16 inches? 0  Gender: 0

## 2014-12-17 NOTE — Discharge Instructions (Signed)
Call wound vac rep if alarm sounds Call for fevers/chills Walk as much as possible

## 2014-12-17 NOTE — Anesthesia Postprocedure Evaluation (Signed)
Anesthesia Post Note  Patient: Karen [name]  Procedure(s) Performed: Procedure(s) (LRB): CLOSURE WOUND LUMBAR SPINE (N/A)  Anesthesia type: general  Patient location: PACU  Post pain: Pain level controlled  Post assessment: Patient's Cardiovascular Status Stable  Post vital signs: Reviewed and stable  Level of consciousness: sedated  Complications: No apparent anesthesia complications

## 2014-12-18 ENCOUNTER — Encounter (HOSPITAL_COMMUNITY): Payer: Self-pay | Admitting: Orthopedic Surgery

## 2014-12-18 DIAGNOSIS — E039 Hypothyroidism, unspecified: Secondary | ICD-10-CM | POA: Diagnosis not present

## 2014-12-18 DIAGNOSIS — K219 Gastro-esophageal reflux disease without esophagitis: Secondary | ICD-10-CM | POA: Diagnosis not present

## 2014-12-18 DIAGNOSIS — I10 Essential (primary) hypertension: Secondary | ICD-10-CM | POA: Diagnosis not present

## 2014-12-18 DIAGNOSIS — Z79899 Other long term (current) drug therapy: Secondary | ICD-10-CM | POA: Diagnosis not present

## 2014-12-18 DIAGNOSIS — Z885 Allergy status to narcotic agent status: Secondary | ICD-10-CM | POA: Diagnosis not present

## 2014-12-18 DIAGNOSIS — K6811 Postprocedural retroperitoneal abscess: Secondary | ICD-10-CM | POA: Diagnosis not present

## 2014-12-18 DIAGNOSIS — Z4789 Encounter for other orthopedic aftercare: Secondary | ICD-10-CM | POA: Diagnosis not present

## 2014-12-18 DIAGNOSIS — Z87891 Personal history of nicotine dependence: Secondary | ICD-10-CM | POA: Diagnosis not present

## 2014-12-18 DIAGNOSIS — G4730 Sleep apnea, unspecified: Secondary | ICD-10-CM | POA: Diagnosis not present

## 2014-12-18 DIAGNOSIS — T814XXA Infection following a procedure, initial encounter: Secondary | ICD-10-CM | POA: Diagnosis not present

## 2014-12-18 DIAGNOSIS — L089 Local infection of the skin and subcutaneous tissue, unspecified: Secondary | ICD-10-CM | POA: Diagnosis not present

## 2014-12-18 DIAGNOSIS — Z882 Allergy status to sulfonamides status: Secondary | ICD-10-CM | POA: Diagnosis not present

## 2014-12-18 MED ORDER — HEPARIN SOD (PORK) LOCK FLUSH 100 UNIT/ML IV SOLN
250.0000 [IU] | INTRAVENOUS | Status: AC | PRN
  Administered 2014-12-18: 250 [IU]

## 2014-12-18 MED ORDER — CEFAZOLIN SODIUM-DEXTROSE 2-3 GM-% IV SOLR: 2.0000 g | Freq: Three times a day (TID) | INTRAVENOUS | Status: AC

## 2014-12-18 NOTE — Progress Notes (Signed)
Occupational Therapy Evaluation Patient Details Name: Karen [name] MRN: [id number] DOB: [birth year] Today's Date: 12/18/2014    History of Present Illness Patient is s/p lumbar decompression 09/2014 and insertion of coflex device for spinal stenosis. Post-op course complicated by wound dehisence 3-4 weeks after surgery. Patient taken back to the OR for I+D of the wound and primary closure.Underwent formal wash-out and vac dressing application. Underwent additional wash out and placement of antiobiotic beads this admission.    Clinical Impression   Completed education regarding ADL and functional mobility for ADL to facilitate safe D/C home. Pt safe to D/ [place] with intermittent S when medically stable. No follow up OT needed at this time.     Follow Up Recommendations  No OT follow up;Supervision - Intermittent    Equipment Recommendations  None recommended by OT    Recommendations for Other Services       Precautions / Restrictions Precautions Precautions: Back Precaution Booklet Issued: No Restrictions Weight Bearing Restrictions: No      Mobility Bed Mobility Overal bed mobility: Modified Independent                Transfers Overall transfer level: Needs assistance Equipment used: 1 person hand held assist Transfers: Sit to/from Stand Sit to Stand: Supervision         General transfer comment: minguard for ambulating    Balance Overall balance assessment: Needs assistance           Standing balance-Leahy Scale: Fair                              ADL                                       Functional mobility during ADLs: Min guard General ADL Comments: Reviewed precautions with pt. Pt ambulating in kyphotic position holding onto IV pole. REcommended for pt to use RW at ome to reduce risk of falls until she was back to baseline. Also discussed home set up and recommending pt use her tub bench to reduce risk of falls. Pt  verbalized understanding. Reviewed technique for LB ADL. Pt discused dificulty with hygiene after toileting. Educated pt on use of toilet tongs for hygiene. Educated on using back precautions for IADL tasks to protect back and develop good body mecahnics.                      Pertinent Vitals/Pain Pain Assessment: Faces Faces Pain Scale: Hurts little more Pain Location: back Pain Descriptors / Indicators: Aching Pain Intervention(s): Limited activity within patient's tolerance     Hand Dominance Right   Extremity/Trunk Assessment Upper Extremity Assessment Upper Extremity Assessment: Overall WFL for tasks assessed   Lower Extremity Assessment Lower Extremity Assessment: Defer to PT evaluation   Cervical / Trunk Assessment Cervical / Trunk Assessment: Normal   Communication Communication Communication: No difficulties   Cognition Arousal/Alertness: Awake/alert Behavior During Therapy: WFL for tasks assessed/performed Overall Cognitive Status: Within Functional Limits for tasks assessed                     General Comments   Pt very appreciative of information.                  Home Living Family/patient expects to be discharged to:: Private residence Living  Arrangements: Spouse/significant other;Children Available Help at Discharge: Family;Available PRN/intermittently Type of Home: House Home Access: Stairs to enter [employer] of Steps: 1 Entrance Stairs-Rails: None Home Layout: Two level;Able to live on main level with bedroom/bathroom     Bathroom Shower/Tub: Tub/shower unit;Curtain Shower/tub characteristics: Architectural technologist: Standard Bathroom Accessibility: Yes How Accessible: Accessible via walker Home Equipment: Grab bars - toilet;Grab bars - tub/shower;Walker - standard;Cane - single point;Shower seat;Bedside commode;Hand held shower head;Adaptive equipment;Tub bench Adaptive Equipment: Reacher;Sock aid        Prior  Functioning/Environment Level of Independence: Independent with assistive device(s)        Comments: using RW    OT Diagnosis: Generalized weakness;Acute pain   OT Problem List: Decreased activity tolerance;Decreased knowledge of use of DME or AE;Pain;Decreased knowledge of precautions   OT Treatment/Interventions: Self-care/ADL training;DME and/or AE instruction;Patient/family education    OT Goals(Current goals can be found in the care plan section) Acute Rehab OT Goals Patient Stated Goal: To get better OT Goal Formulation: All assessment and education complete, DC therapy  OT Frequency:     Barriers to D/C:            Co-evaluation              End of Session Nurse Communication: Other (comment) (D/C needs)  Activity Tolerance: Patient tolerated treatment well Patient left: in bed;with call bell/phone within reach;with family/visitor present   Time: 0355-9741 OT Time Calculation (min): 19 min Charges:  OT General Charges $OT Visit: 1 Procedure OT Evaluation $Initial OT Evaluation Tier I: 1 Procedure G-Codes: OT G-codes **NOT FOR INPATIENT CLASS** Functional Assessment Tool Used: clinical judgement Functional Limitation: Self care Self Care Current Status (U3845): At least 1 percent but less than 20 percent impaired, limited or restricted Self Care Goal Status (X6468): At least 1 percent but less than 20 percent impaired, limited or restricted Self Care Discharge Status [phone]): At least 1 percent but less than 20 percent impaired, limited or restricted  [facility] 12/18/2014, 10:47 AM   [facility], OTR/L  [phone] 12/18/2014

## 2014-12-18 NOTE — Op Note (Signed)
NAMEJADYNN, EPPING                 ACCOUNT NO.:  [id number]  MEDICAL RECORD NO.:  [id number]  LOCATION:  3C07C                        FACILITY:  [facility]  PHYSICIAN:  Leone Mobley D. Rolena Infante, M.D. DATE OF BIRTH:  08/02/1945  DATE OF PROCEDURE:  12/17/2014 DATE OF DISCHARGE:                              OPERATIVE REPORT   PREOPERATIVE DIAGNOSIS:  Wound infection from previous spinal surgery.  POSTOPERATIVE DIAGNOSIS:  Wound infection from previous spinal surgery.  OPERATIVE PROCEDURE:  Repeat washout and primary wound closure and application of incisional wound VAC.  COMPLICATIONS:  None.  CONDITION:  Stable.  HISTORY:  This is a very pleasant 69 year old woman who had uneventful lumbar decompression with insertion of a Coflex device. Postoperatively, she ultimately had a significant wound infection, which required multiple washouts and VAC drain.  The patient returns today on IV antibiotics with a much healthier appearing wound for primary closure.  All appropriate risks, benefits, and alternatives were explained to the patient.  Consent was obtained.  OPERATIVE NOTE:  The patient was brought to the operating room, placed supine on the operating table.  After successful induction of general anesthesia and endotracheal intubation, TEDs, SCDs were applied.  She was turned prone onto a chest and pelvic roll.  The back was prepped and draped in a standard fashion.  The previous VAC drain dressing was removed.  There was healthy granulating tissue.  No evidence of any purulent drainage.  I pulse lavaged the wound with 3 L of saline.  Once this was done, I placed Similac antibiotics, vanco beads into the wound and closed the deep fascia with interrupted #1 PDS.  This was just a loose reapproximation.  I then placed some more antibiotic beads and then closed the skin edges with a vertical mattress suture, interrupted with a #1 PDS.  The skin was then cleaned and the incisional VAC was then  applied.  She was functioning well.  The patient was then extubated and transferred to PACU without incident.  At the end of the case, all needle and sponge counts were correct.  There was no adverse intraoperative events.  First assistant was my PA, [employer].     Trust Crago D. Rolena Infante, M.D.     DDB/MEDQ  D:  12/17/2014  T:  12/18/2014  Job:  [id number]

## 2014-12-18 NOTE — Care Management Note (Signed)
Case Management Note  Patient Details  Name: Karen [name] MRN: [id number] Date of Birth: [birth year]  Subjective/Objective:             Wound infection from previous spinal surgery, repeat washout, primary wound closure, application of incisional wound VAC       Action/Plan: Spoke with patient and her daughter about home IV antibiotics and HH. She selected [facility]. Patient stated that she lives with her husband but she and her daughter will be willing to learn to give the antibiotics. Contacted Miranda at [facility] and [facility] and IV antibiotics.They will also assist with the wound VAC.  Patient has Prevena wound vac from [employer]. Contacted Rickie with [employer], the vac is already approved, no further paperwork needed only need to know if [facility] is to remove the [facility] or if it will be removed at the MD office. Will continue to follow.   Expected Discharge Date:                  Expected Discharge Plan:  [place]  In-House Referral:  NA  Discharge planning Services  CM Consult  Post Acute Care Choice:  Home Health Choice offered to:  Patient  DME Arranged:    DME Agency:     HH Arranged:  RN, IV Antibiotics HH Agency:  [facility]  Status of Service:  In process, will continue to follow  Medicare Important Message Given:    Date Medicare IM Given:    Medicare IM give by:    Date Additional Medicare IM Given:    Additional Medicare Important Message give by:     If discussed at [place] of Stay Meetings, dates discussed:    Additional Comments:  Nila Nephew, RN 12/18/2014, 9:53 AM

## 2014-12-18 NOTE — Progress Notes (Signed)
Pt and daughter given D/C instructions with verbal understanding. Pt's incision has wound vac attached at D/C. Pt's was D/C'd home with PICC line for home abx per MD order. Home health was arranged with [facility] for administration of abx prior to D/C. Pt D/C'd home via wheelchair @ 1230 per MD order. Pt is stable @ D/C and has no other needs at this time. Holli Humbles, RN

## 2014-12-18 NOTE — Progress Notes (Signed)
PT Cancellation Note  Patient Details Name: Karen [name] MRN: [id number] DOB: 09/16/1945   Cancelled Treatment:    Reason Eval/Treat Not Completed: PT screened, no needs identified, will sign off, patient reports ambulating, has DME, is returning home. Is limited  With ambulation only at this time. No further PT needs. Reviewed back precautions and to walk only. Patient states the MD stated  No PT at DC.   Claretha Cooper 12/18/2014, 9:50 AM Tresa Endo PT [phone]

## 2014-12-18 NOTE — Progress Notes (Signed)
    Subjective: Procedure(s) (LRB): CLOSURE WOUND LUMBAR SPINE (N/A) 1 Day Post-Op  Patient reports pain as 2 on 0-10 scale.  Reports deferred at this time leg pain reports incisional back pain   Positive void Negative bowel movement Positive flatus Negative chest pain or shortness of breath  Objective: Vital signs in last 24 hours: Temp:  [97.9 F (36.6 C)-99 F (37.2 C)] 99 F (37.2 C) (07/07 0758) Pulse Rate:  [72-90] 74 (07/07 0758) Resp:  [9-18] 16 (07/07 0758) BP: (127-184)/(55-84) 132/59 mmHg (07/07 0758) SpO2:  [91 %-100 %] 96 % (07/07 0758) Weight:  [78.501 kg (173 lb 1 oz)] 78.501 kg (173 lb 1 oz) (07/06 1412)  Intake/Output from previous day: 07/06 0701 - 07/07 0700 In: 800 [I.V.:800] Out: 100 [Blood:100]  Labs:  Recent Labs  12/16/14 0615  WBC 6.1  RBC 3.06*  HCT 29.7*  PLT 364    Recent Labs  12/16/14 0615  NA 141  K 3.7  CL 102  CO2 31  BUN 18  CREATININE 0.67  GLUCOSE 89  CALCIUM 9.1   No results for input(s): LABPT, INR in the last 72 hours.  Physical Exam: Neurologically intact Intact pulses distally Incision: Vac functioning well Compartment soft  Assessment/Plan: Patient stable  xrays n/a Continue mobilization with physical therapy Continue care  [facility] diet Up with therapy  D/C to home - will make sure home IV Abx are set up prior to d/c  Melina Schools, MD [place] [phone]

## 2014-12-22 DIAGNOSIS — Z79899 Other long term (current) drug therapy: Secondary | ICD-10-CM | POA: Diagnosis not present

## 2014-12-22 DIAGNOSIS — Z5181 Encounter for therapeutic drug level monitoring: Secondary | ICD-10-CM | POA: Diagnosis not present

## 2014-12-22 DIAGNOSIS — T814XXA Infection following a procedure, initial encounter: Secondary | ICD-10-CM | POA: Diagnosis not present

## 2014-12-23 ENCOUNTER — Ambulatory Visit: Payer: Medicare Other | Admitting: Internal Medicine

## 2014-12-23 ENCOUNTER — Telehealth: Payer: Self-pay | Admitting: *Deleted

## 2014-12-23 NOTE — Telephone Encounter (Signed)
Verbal order per Dr. Linus Salmons given to [facility] at [facility] to pull patient's picc line after last dose on 12/30/14. Myrtis Hopping

## 2014-12-24 DIAGNOSIS — T814XXA Infection following a procedure, initial encounter: Secondary | ICD-10-CM | POA: Diagnosis not present

## 2014-12-24 DIAGNOSIS — Z5181 Encounter for therapeutic drug level monitoring: Secondary | ICD-10-CM | POA: Diagnosis not present

## 2014-12-24 DIAGNOSIS — Z79899 Other long term (current) drug therapy: Secondary | ICD-10-CM | POA: Diagnosis not present

## 2014-12-25 DIAGNOSIS — T814XXA Infection following a procedure, initial encounter: Secondary | ICD-10-CM | POA: Diagnosis not present

## 2014-12-26 DIAGNOSIS — S21201A Unspecified open wound of right back wall of thorax without penetration into thoracic cavity, initial encounter: Secondary | ICD-10-CM | POA: Diagnosis not present

## 2014-12-26 NOTE — Discharge Summary (Signed)
Patient ID: Karen [name] MRN: [id number] DOB/AGE: 08/21/1945 69 y.o.  Admit date: 12/17/2014 Discharge date: 12/26/2014  Admission Diagnoses:  Active Problems:   Wound infection   Discharge Diagnoses:  Active Problems:   Wound infection  status post Procedure(s): CLOSURE WOUND LUMBAR SPINE  Past Medical History  Diagnosis Date  . HTN (hypertension)   . Goiter   . Hypothyroidism   . Tubular adenoma   . Diverticulitis   . GERD (gastroesophageal reflux disease)     since gallbladder removed, no more GERD  . Sleep apnea     has never been tested.  was dx with cancer first and had this problem on the back burner  . Breast cancer 2004    bilateral mastectomy-all left lymph nodes removed; chemo. Patient states she had "spot" on the right breast as well.    Surgeries: Procedure(s): CLOSURE WOUND LUMBAR SPINE on 12/17/2014   Consultants:    Discharged Condition: Improved  Hospital Course: Karen [name] is an 69 y.o. female who was admitted 12/17/2014 for operative treatment of <principal problem not specified>. Patient failed conservative treatments (please see the history and physical for the specifics) and had severe unremitting pain that affects sleep, daily activities and work/hobbies. After pre-op clearance, the patient was taken to the operating room on 12/17/2014 and underwent  Procedure(s): [place].    Patient was given perioperative antibiotics:  Anti-infectives    Start     Dose/Rate Route Frequency Ordered Stop   12/18/14 0000  ceFAZolin (ANCEF) 2-3 GM-% SOLR     2 g 100 mL/hr over 30 Minutes Intravenous Every 8 hours 12/18/14 1024 01/07/15 2359   12/17/14 2200  ceFAZolin (ANCEF) IVPB 2 g/50 mL premix  Status:  Discontinued     2 g 100 mL/hr over 30 Minutes Intravenous 3 times per day 12/17/14 1909 12/18/14 1547   12/17/14 1643  vancomycin (VANCOCIN) powder  Status:  Discontinued       As needed 12/17/14 1644 12/17/14 1709       Patient was  given sequential compression devices and early ambulation to prevent DVT.   Patient benefited maximally from hospital stay and there were no complications. At the time of discharge, the patient was urinating/moving their bowels without difficulty, tolerating a regular diet, pain is controlled with oral pain medications and they have been cleared by PT/OT.   Recent vital signs: No data found.    Recent laboratory studies: No results for input(s): WBC, HGB, HCT, PLT, NA, K, CL, CO2, BUN, CREATININE, GLUCOSE, INR, CALCIUM in the last 72 hours.  Invalid input(s): PT, 2   Discharge Medications:     Medication List    TAKE these medications        ALPRAZolam 1 MG tablet  Commonly known as:  XANAX  Take 1 tablet (1 mg total) by mouth 3 (three) times daily as needed for anxiety.     amLODipine 10 MG tablet  Commonly known as:  NORVASC  Take 10 mg by mouth daily.     aspirin EC 81 MG tablet  Take 1 tablet (81 mg total) by mouth daily. Start on POD #2     ceFAZolin 2-3 GM-% Solr  Commonly known as:  ANCEF  Inject 50 mLs (2 g total) into the vein every 8 (eight) hours.     ceFAZolin 2-3 GM-% Solr  Commonly known as:  ANCEF  Inject 50 mLs (2 g total) into the vein every 8 (eight)  hours.     docusate sodium 100 MG capsule  Commonly known as:  COLACE  Take 1 capsule (100 mg total) by mouth 3 (three) times daily as needed for mild constipation.     HYDROcodone-acetaminophen 10-325 MG per tablet  Commonly known as:  NORCO  Take 1 tablet by mouth every 6 (six) hours as needed.     levothyroxine 100 MCG tablet  Commonly known as:  SYNTHROID, LEVOTHROID  Take 100 mcg by mouth every morning.     lisinopril-hydrochlorothiazide 20-12.5 MG per tablet  Commonly known as:  PRINZIDE,ZESTORETIC  Take 1 tablet by mouth 2 (two) times daily.     methocarbamol 500 MG tablet  Commonly known as:  ROBAXIN  Take 1 tablet (500 mg total) by mouth 3 (three) times daily as needed for muscle spasms.       ondansetron 4 MG tablet  Commonly known as:  ZOFRAN  Take 1 tablet (4 mg total) by mouth every 8 (eight) hours as needed for nausea or vomiting.     OXYCONTIN 15 mg T12a 12 hr tablet  Generic drug:  OxyCODONE  Take 15 mg by mouth every 12 (twelve) hours. Takes at 0900 2100        Diagnostic Studies: Dg Chest 2 View  12/11/2014   CLINICAL DATA:  Decompression surgery for lumbar spinal stenosis.  EXAM: CHEST  2 VIEW  COMPARISON:  09/27/2014  FINDINGS: There is a right arm PICC line with tip in the projection of the cavoatrial junction. The heart size appears normal. Aortic tortuosity and atherosclerosis noted. There is no pleural effusion or edema. No airspace consolidation.  IMPRESSION: 1. No acute cardiopulmonary abnormalities. 2. Aortic atherosclerosis and tortuosity.   Electronically Signed   By: Kerby Moors M.D.   On: 12/11/2014 14:57   Dg Lumbar Spine 2-3 Views  12/01/2014   CLINICAL DATA:  Infection and surgical site.  EXAM: LUMBAR SPINE - 2-3 VIEW  COMPARISON:  09/25/2014  FINDINGS: Posterior hardware noted at the L3-4 and L4-5 levels. Appearance is stable since prior study. 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 No fracture. No acute bony abnormality.  IMPRESSION: Postoperative changes in the lower lumbar spine. Degenerative disc and facet disease. No acute findings.   Electronically Signed   By: Rolm Baptise M.D.   On: 12/01/2014 18:04          Follow-up Information    Follow up with Dahlia Bailiff, MD On 12/23/2014.   Specialty:  Orthopedic Surgery   Why:  For wound re-check   Contact information:   [address] [place] [address] [phone]       Follow up with [facility].   Why:  They will contact you to schedule home nursing visits.   Contact information:   [address] [place] [address] [phone]       Discharge Plan:  discharge to home  Disposition: patient s/p wash  out and application of vac.  Admitted for wound closure. Hospital course uncomplicated     Signed: Pratham Cassatt D for Dr. Melina Schools [facility] [phone] 12/26/2014, 4:35 PM

## 2014-12-29 LAB — FUNGUS CULTURE W SMEAR: Fungal Smear: NONE SEEN

## 2014-12-30 DIAGNOSIS — T814XXA Infection following a procedure, initial encounter: Secondary | ICD-10-CM | POA: Diagnosis not present

## 2015-01-26 DIAGNOSIS — E039 Hypothyroidism, unspecified: Secondary | ICD-10-CM | POA: Diagnosis not present

## 2015-01-27 DIAGNOSIS — Z4789 Encounter for other orthopedic aftercare: Secondary | ICD-10-CM | POA: Diagnosis not present

## 2015-01-27 DIAGNOSIS — M1711 Unilateral primary osteoarthritis, right knee: Secondary | ICD-10-CM | POA: Diagnosis not present

## 2015-05-20 DIAGNOSIS — M25561 Pain in right knee: Secondary | ICD-10-CM | POA: Diagnosis not present

## 2015-05-28 DIAGNOSIS — M25561 Pain in right knee: Secondary | ICD-10-CM | POA: Diagnosis not present

## 2015-05-28 DIAGNOSIS — I10 Essential (primary) hypertension: Secondary | ICD-10-CM | POA: Diagnosis not present

## 2015-05-28 DIAGNOSIS — Z6835 Body mass index (BMI) 35.0-35.9, adult: Secondary | ICD-10-CM | POA: Diagnosis not present

## 2015-06-19 DIAGNOSIS — M25561 Pain in right knee: Secondary | ICD-10-CM | POA: Diagnosis not present

## 2015-06-19 DIAGNOSIS — I10 Essential (primary) hypertension: Secondary | ICD-10-CM | POA: Diagnosis not present

## 2015-06-29 DIAGNOSIS — I10 Essential (primary) hypertension: Secondary | ICD-10-CM | POA: Diagnosis not present

## 2015-06-29 DIAGNOSIS — M25561 Pain in right knee: Secondary | ICD-10-CM | POA: Diagnosis not present

## 2015-07-22 ENCOUNTER — Ambulatory Visit: Payer: Medicare Other | Admitting: Orthopaedic Surgery

## 2015-08-03 DIAGNOSIS — R7301 Impaired fasting glucose: Secondary | ICD-10-CM | POA: Diagnosis not present

## 2015-08-03 DIAGNOSIS — E039 Hypothyroidism, unspecified: Secondary | ICD-10-CM | POA: Diagnosis not present

## 2015-08-03 DIAGNOSIS — I10 Essential (primary) hypertension: Secondary | ICD-10-CM | POA: Diagnosis not present

## 2015-08-04 ENCOUNTER — Other Ambulatory Visit (HOSPITAL_COMMUNITY): Payer: Self-pay | Admitting: Internal Medicine

## 2015-08-04 ENCOUNTER — Ambulatory Visit (HOSPITAL_COMMUNITY)
Admission: RE | Admit: 2015-08-04 | Discharge: 2015-08-04 | Disposition: A | Discharge status: Home / Self Care | Payer: Medicare Other | Source: Ambulatory Visit | Attending: Internal Medicine | Admitting: Internal Medicine

## 2015-08-04 DIAGNOSIS — M25551 Pain in right hip: Secondary | ICD-10-CM | POA: Diagnosis not present

## 2015-08-04 DIAGNOSIS — R7301 Impaired fasting glucose: Secondary | ICD-10-CM | POA: Diagnosis not present

## 2015-08-04 DIAGNOSIS — I10 Essential (primary) hypertension: Secondary | ICD-10-CM | POA: Diagnosis not present

## 2015-08-04 DIAGNOSIS — M1611 Unilateral primary osteoarthritis, right hip: Secondary | ICD-10-CM | POA: Diagnosis not present

## 2015-08-04 DIAGNOSIS — E039 Hypothyroidism, unspecified: Secondary | ICD-10-CM | POA: Diagnosis not present

## 2015-09-01 DIAGNOSIS — M1611 Unilateral primary osteoarthritis, right hip: Secondary | ICD-10-CM | POA: Diagnosis not present

## 2015-12-18 DIAGNOSIS — R7301 Impaired fasting glucose: Secondary | ICD-10-CM | POA: Diagnosis not present

## 2015-12-18 DIAGNOSIS — E782 Mixed hyperlipidemia: Secondary | ICD-10-CM | POA: Diagnosis not present

## 2015-12-18 DIAGNOSIS — E039 Hypothyroidism, unspecified: Secondary | ICD-10-CM | POA: Diagnosis not present

## 2015-12-22 DIAGNOSIS — M25559 Pain in unspecified hip: Secondary | ICD-10-CM | POA: Diagnosis not present

## 2015-12-22 DIAGNOSIS — R7301 Impaired fasting glucose: Secondary | ICD-10-CM | POA: Diagnosis not present

## 2015-12-22 DIAGNOSIS — I10 Essential (primary) hypertension: Secondary | ICD-10-CM | POA: Diagnosis not present

## 2015-12-22 DIAGNOSIS — E039 Hypothyroidism, unspecified: Secondary | ICD-10-CM | POA: Diagnosis not present

## 2016-01-14 ENCOUNTER — Encounter: Payer: Self-pay | Admitting: Internal Medicine

## 2016-02-05 ENCOUNTER — Ambulatory Visit: Payer: Self-pay | Admitting: Orthopedic Surgery

## 2016-02-18 ENCOUNTER — Ambulatory Visit: Payer: Self-pay | Admitting: Orthopedic Surgery

## 2016-02-18 NOTE — H&P (Signed)
TOTAL HIP ADMISSION H&P  Patient is admitted for right total hip arthroplasty.  Subjective:  Chief Complaint: right hip pain  HPI: Karen [name], 69 y.o. female, has a history of pain and functional disability in the right hip(s) due to arthritis and patient has failed non-surgical conservative treatments for greater than 12 weeks to include NSAID's and/or analgesics, flexibility and strengthening excercises, use of assistive devices, weight reduction as appropriate and activity modification.  Onset of symptoms was gradual starting 2 years ago with rapidlly worsening course since that time.The patient noted no past surgery on the right hip(s).  Patient currently rates pain in the right hip at 10 out of 10 with activity. Patient has night pain, worsening of pain with activity and weight bearing, pain that interfers with activities of daily living, pain with passive range of motion and crepitus. Patient has evidence of subchondral cysts, subchondral sclerosis, periarticular osteophytes and joint space narrowing by imaging studies. This condition presents safety issues increasing the risk of falls.There is no current active infection.  Patient Active Problem List   Diagnosis Date Noted  . Wound infection ([place]) 12/17/2014  . Wound infection after surgery 11/07/2014  . Spinal stenosis 09/25/2014  . Hematochezia 03/19/2013  . LLQ pain 01/24/2011  . Umbilical hernia [id number]  . Rectal bleed 01/24/2011  . GERD (gastroesophageal reflux disease) 01/24/2011  . Dysphagia 01/24/2011  . DIVERTICULITIS-COLON 08/30/2010   Past Medical History:  Diagnosis Date  . Breast cancer 2004   bilateral mastectomy-all left lymph nodes removed; chemo. Patient states she had "spot" on the right breast as well.  . Diverticulitis   . GERD (gastroesophageal reflux disease)    since gallbladder removed, no more GERD  . Goiter   . HTN (hypertension)   . Hypothyroidism   . Sleep apnea    has never been tested.  was  dx with cancer first and had this problem on the back burner  . Tubular adenoma     Past Surgical History:  Procedure Laterality Date  . ABDOMINAL HYSTERECTOMY    . bilateral mastectomy w/reconstruction  2004  . BREAST SURGERY    . CHOLECYSTECTOMY    . COLONOSCOPY  2008   colon with few diverticula in sigmoid colon  . COLONOSCOPY  02/16/2011   Dr. Gala Romney- colon and rectal polyps= tubular adenoma, L sided diverticulosis.  . DEBRIDEMENT AND CLOSURE WOUND N/A 12/17/2014   Procedure: CLOSURE WOUND LUMBAR SPINE;  Surgeon: Melina Schools, MD;  Location: [place];  Service: Orthopedics;  Laterality: N/A;  . ESOPHAGOGASTRODUODENOSCOPY  2008   lipoma distal esophagus, gastritis   . ESOPHAGOGASTRODUODENOSCOPY  02/16/2011   Dr. Gala Romney- normal esophagus, small hiatal hernia o/w normal stomach. normal first and second portion of the duodenum  . HERNIA REPAIR     umbilical hernia was repaired  . INJECTION KNEE Right 11/07/2014   Procedure: KNEE INJECTION;  Surgeon: Melina Schools, MD;  Location: [facility];  Service: Orthopedics;  Laterality: Right;  . LUMBAR LAMINECTOMY/DECOMPRESSION MICRODISCECTOMY N/A 09/25/2014   Procedure: DECOMPRESSION L3-L5 WITH INSERTION OF COFLEX   ( 2 LEVELS);  Surgeon: Melina Schools, MD;  Location: [facility];  Service: Orthopedics;  Laterality: N/A;  . LUMBAR LAMINECTOMY/DECOMPRESSION MICRODISCECTOMY N/A 11/07/2014   Procedure: IRRIGATION AND DEBRIDEMENT BACK WOUND, PLACEMENT OF ANTIBIOTIC BEADS;  Surgeon: Melina Schools, MD;  Location: [place];  Service: Orthopedics;  Laterality: N/A;  . LUMBAR WOUND DEBRIDEMENT N/A 12/01/2014   Procedure: IRRIGATION AND DEBRIDEMENT OF LUMBAR WOUND WITH VAC PLACEMENT;  Surgeon: Melina Schools,  MD;  Location: [facility];  Service: Orthopedics;  Laterality: N/A;  . LUMBAR WOUND DEBRIDEMENT N/A 12/03/2014   Procedure: WOUND VAC DRESSING CHANGE ;  Surgeon: Melina Schools, MD;  Location: [place];  Service: Orthopedics;  Laterality: N/A;  Venia Minks DILATION  02/16/2011   Procedure:  Venia Minks DILATION;  Surgeon: Daneil Dolin, MD;  [facility];  Service: Endoscopy;  Laterality: N/A;  . MASTECTOMY     bilateral 2004 left side had 17 nodes removed  . PARTIAL HYSTERECTOMY    . SAVORY DILATION  02/16/2011   Procedure: SAVORY DILATION;  Surgeon: Daneil Dolin, MD;  [facility];  Service: Endoscopy;  Laterality: N/A;     (Not in a hospital admission) Allergies  Allergen Reactions  . Sulfonamide Derivatives Anaphylaxis  . Morphine And Related Swelling    Social History  Substance Use Topics  . Smoking status: Former Smoker    Packs/day: 1.00    Years: 20.00    Quit date: 09/24/1995  . Smokeless tobacco: Not on file     Comment: Quit 1997  . Alcohol use No    Family History  Problem Relation Age of Onset  . Pancreatic cancer Mother     deceased  . Aneurysm Father     deceased, aortic  . Colon cancer Neg Hx   . Breast cancer Sister     deceased 8 years after initial diagnosis  . Breast cancer Sister     survivor  . Diverticulitis Brother     s/p partial colon resection     Review of Systems  Constitutional: Negative.   HENT: Negative.   Eyes: Negative.   Respiratory: Negative.   Cardiovascular: Negative.   Gastrointestinal: Negative.   Genitourinary: Negative.   Musculoskeletal: Positive for joint pain.  Skin: Negative.   Neurological: Negative.   Endo/Heme/Allergies: Negative.   Psychiatric/Behavioral: Negative.     Objective:  Physical Exam  Constitutional: She is oriented to person, place, and time. She appears well-developed and well-nourished.  HENT:  Head: Normocephalic and atraumatic.  Eyes: Conjunctivae and EOM are normal. Pupils are equal, round, and reactive to light.  Neck: Normal range of motion. Neck supple.  Cardiovascular: Normal rate, regular rhythm and intact distal pulses.   Respiratory: Effort normal and breath sounds normal. No respiratory distress.  GI: Soft. Bowel sounds are normal. She exhibits no  distension.  Genitourinary:  Genitourinary Comments: deferred  Musculoskeletal:       Right hip: She exhibits decreased range of motion, bony tenderness and crepitus.  Neurological: She is alert and oriented to person, place, and time. She has normal reflexes.  Skin: Skin is warm and dry.  Psychiatric: She has a normal mood and affect. Her behavior is normal. Judgment and thought content normal.    Vital signs in last 24 hours: @VSRANGES @  Labs:   Estimated body mass index is 30.66 kg/m as calculated from the following:   Height as of 12/17/14: 5\' 3"  (1.6 m).   Weight as of 12/17/14: 78.5 kg (173 lb 1 oz).   Imaging Review Plain radiographs demonstrate severe degenerative joint disease of the right hip(s). The bone quality appears to be adequate for age and reported activity level.  Assessment/Plan:  End stage arthritis, right hip(s)  The patient history, physical examination, clinical judgement of the provider and imaging studies are consistent with end stage degenerative joint disease of the right hip(s) and total hip arthroplasty is deemed medically necessary. The treatment options including  medical management, injection therapy, arthroscopy and arthroplasty were discussed at length. The risks and benefits of total hip arthroplasty were presented and reviewed. The risks due to aseptic loosening, infection, stiffness, dislocation/subluxation,  thromboembolic complications and other imponderables were discussed.  The patient acknowledged the explanation, agreed to proceed with the plan and consent was signed. Patient is being admitted for inpatient treatment for surgery, pain control, PT, OT, prophylactic antibiotics, VTE prophylaxis, progressive ambulation and ADL's and discharge planning.The patient is planning to be discharged home with home health services. Jehovah's witness - hgb pending

## 2016-02-19 ENCOUNTER — Encounter (HOSPITAL_COMMUNITY): Payer: Self-pay

## 2016-02-19 NOTE — Pre-Procedure Instructions (Signed)
Karen [name]  02/19/2016      [facility] [address], [place] [address] Phone: [phone] Fax: [phone]  [facility] [address] [place], [place] [address] [place] [address] Phone: [phone] Fax: [phone]    Your procedure is scheduled on Monday, February 29, 2016  Report to [facility] Admitting at 9:45 A.M.  Call this number if you have problems the morning of surgery:  [phone]   Remember:  Do not eat food or drink liquids after midnight Sunday, February 28, 2016  Take these medicines the morning of surgery with A SIP OF WATER : Amlodipine (Norvasc), Levothyroxine ( Synthroid), Oxycodone, if needed: Xanax for anxiety Stop taking Aspirin, vitamins, fish oil and herbal medications. Do not take any NSAIDs ie: Ibuprofen, Advil, Naproxen, BC and Goody Powder or any medication containing Aspirin; stop now.  Do not wear jewelry, make-up or nail polish.  Do not wear lotions, powders, or perfumes, or deoderant.  Do not shave 48 hours prior to surgery.    Do not bring valuables to the hospital.  [facility] is not responsible for any belongings or valuables.  Contacts, dentures or bridgework may not be worn into surgery.  Leave your suitcase in the car.  After surgery it may be brought to your room.  For patients admitted to the hospital, discharge time will be determined by your treatment team.  Patients discharged the day of surgery will not be allowed to drive home.   Name and phone number of your driver:   Special instructions:   [place] - Preparing for Surgery  Before surgery, you can play an important role.  Because skin is not sterile, your skin needs to be as free of germs as possible.  You can reduce the number of germs on you skin by washing with CHG (chlorahexidine gluconate) soap before surgery.  CHG is an antiseptic cleaner which kills germs and bonds  with the skin to continue killing germs even after washing.  Please DO NOT use if you have an allergy to CHG or antibacterial soaps.  If your skin becomes reddened/irritated stop using the CHG and inform your nurse when you arrive at Short Stay.  Do not shave (including legs and underarms) for at least 48 hours prior to the first CHG shower.  You may shave your face.  Please follow these instructions carefully:   1.  Shower with CHG Soap the night before surgery and the morning of Surgery.  2.  If you choose to wash your hair, wash your hair first as usual with your normal shampoo.  3.  After you shampoo, rinse your hair and body thoroughly to remove the Shampoo.  4.  Use CHG as you would any other liquid soap.  You can apply chg directly  to the skin and wash gently with scrungie or a clean washcloth.  5.  Apply the CHG Soap to your body ONLY FROM THE NECK DOWN.  Do not use on open wounds or open sores.  Avoid contact with your eyes, ears, mouth and genitals (private parts).  Wash genitals (private parts) with your normal soap.  6.  Wash thoroughly, paying special attention to the area where your surgery will be performed.  7.  Thoroughly rinse your body with warm water from the neck down.  8.  DO NOT shower/wash with your  normal soap after using and rinsing off the CHG Soap.  9.  Pat yourself dry with a clean towel.            10.  Wear clean pajamas.            11.  Place clean sheets on your bed the night of your first shower and do not sleep with pets.  Day of Surgery  Do not apply any lotions/deodorants the morning of surgery.  Please wear clean clothes to the hospital/surgery center.  Please read over the following fact sheets that you were given. Pain Booklet, Coughing and Deep Breathing, Blood Transfusion Information, Total Joint Packet, MRSA Information and Surgical Site Infection Prevention

## 2016-02-22 ENCOUNTER — Encounter (HOSPITAL_COMMUNITY)
Admission: RE | Admit: 2016-02-22 | Discharge: 2016-02-22 | Disposition: A | Discharge status: Home / Self Care | Payer: Medicare Other | Source: Ambulatory Visit | Attending: Orthopedic Surgery | Admitting: Orthopedic Surgery

## 2016-02-22 ENCOUNTER — Encounter (HOSPITAL_COMMUNITY): Payer: Self-pay

## 2016-02-22 DIAGNOSIS — Z01818 Encounter for other preprocedural examination: Secondary | ICD-10-CM | POA: Insufficient documentation

## 2016-02-22 DIAGNOSIS — I10 Essential (primary) hypertension: Secondary | ICD-10-CM | POA: Diagnosis not present

## 2016-02-22 HISTORY — DX: Pneumonia, unspecified organism: J18.9

## 2016-02-22 HISTORY — DX: Unspecified osteoarthritis, unspecified site: M19.90

## 2016-02-22 LAB — BASIC METABOLIC PANEL
ANION GAP: 9 (ref 5–15)
BUN: 15 mg/dL (ref 6–20)
CALCIUM: 9.8 mg/dL (ref 8.9–10.3)
CO2: 30 mmol/L (ref 22–32)
Chloride: 102 mmol/L (ref 101–111)
Creatinine, Ser: 0.73 mg/dL (ref 0.44–1.00)
GLUCOSE: 93 mg/dL (ref 65–99)
POTASSIUM: 4.1 mmol/L (ref 3.5–5.1)
Sodium: 141 mmol/L (ref 135–145)

## 2016-02-22 LAB — CBC
HEMATOCRIT: 40.3 % (ref 36.0–46.0)
Hemoglobin: 13.1 g/dL (ref 12.0–15.0)
MCH: 31.3 pg (ref 26.0–34.0)
MCHC: 32.5 g/dL (ref 30.0–36.0)
MCV: 96.4 fL (ref 78.0–100.0)
PLATELETS: 245 10*3/uL (ref 150–400)
RBC: 4.18 MIL/uL (ref 3.87–5.11)
RDW: 14 % (ref 11.5–15.5)
WBC: 9.2 10*3/uL (ref 4.0–10.5)

## 2016-02-22 LAB — SURGICAL PCR SCREEN
MRSA, PCR: NEGATIVE
Staphylococcus aureus: NEGATIVE

## 2016-02-22 LAB — NO BLOOD PRODUCTS

## 2016-02-22 NOTE — Progress Notes (Signed)
Left a voice message with Margarita Grizzle, Surgical Coordinator, to make MD aware that a type and screen has not done because the pt is refusing blood.

## 2016-02-22 NOTE — Progress Notes (Signed)
Pt denies SOB, chest pain, and being under the care of a cardiologist. Pt denies having a cardiac cath and stated that a stress test was performed > 10 years ago. Pt denies having an EKG and chest x ray within the last year.

## 2016-02-22 NOTE — Progress Notes (Signed)
   02/22/16 1025  OBSTRUCTIVE SLEEP APNEA  Have you ever been diagnosed with sleep apnea through a sleep study? No  Do you snore loudly (loud enough to be heard through closed doors)?  1  Do you often feel tired, fatigued, or sleepy during the daytime (such as falling asleep during driving or talking to someone)? 1  Has anyone observed you stop breathing during your sleep? 1  Do you have, or are you being treated for high blood pressure? 1  BMI more than 35 kg/m2? 1  Age > 50 (1-yes) 1  Neck circumference greater than:Female 16 inches or larger, Female 17inches or larger? 0  Female Gender (Yes=1) 0  Obstructive Sleep Apnea Score 6

## 2016-02-26 MED ORDER — SODIUM CHLORIDE 0.9 % IV SOLN: INTRAVENOUS | Status: DC

## 2016-02-26 MED ORDER — TRANEXAMIC ACID 1000 MG/10ML IV SOLN
1000.0000 mg | INTRAVENOUS | Status: AC
  Administered 2016-02-29: 1000 mg via INTRAVENOUS
  Filled 2016-02-26: qty 10

## 2016-02-26 MED ORDER — TRANEXAMIC ACID 1000 MG/10ML IV SOLN
2000.0000 mg | INTRAVENOUS | Status: DC
  Filled 2016-02-26: qty 20

## 2016-02-28 NOTE — Anesthesia Preprocedure Evaluation (Addendum)
Anesthesia Evaluation  Patient identified by MRN, date of birth, ID band Patient awake    Reviewed: Allergy & Precautions, NPO status , Patient's Chart, lab work & pertinent test results  History of Anesthesia Complications Negative for: history of anesthetic complications  Airway Mallampati: III  TM Distance: >3 FB Neck ROM: Full   Comment: Previous grade I view with MAC 3 Dental  (+) Missing, Dental Advisory Given, Poor Dentition,    Pulmonary neg shortness of breath, sleep apnea (symptoms suggestive (STOP-BANG 6/8)) , neg COPD, neg recent URI, former smoker,    Pulmonary exam normal breath sounds clear to auscultation       Cardiovascular hypertension, Pt. on medications (-) angina(-) Past MI, (-) Cardiac Stents and (-) CABG  Rhythm:Regular Rate:Normal     Neuro/Psych neg Seizures negative neurological ROS     GI/Hepatic Neg liver ROS, GERD  Controlled,diverticulitis   Endo/Other  neg diabetesHypothyroidism   Renal/GU negative Renal ROS     Musculoskeletal  (+) Arthritis , Osteoarthritis,    Abdominal (+) + obese,   Peds  Hematology negative hematology ROS (+)   Anesthesia Other Findings H/o breast cancer in 2004 s/p bilateral mastectomies  Reproductive/Obstetrics                            Anesthesia Physical Anesthesia Plan  ASA: III  Anesthesia Plan: General   Post-op Pain Management:    Induction: Intravenous  Airway Management Planned: Oral ETT  Additional Equipment:   Intra-op Plan:   Post-operative Plan: Extubation in OR  Informed Consent: I have reviewed the patients History and Physical, chart, labs and discussed the procedure including the risks, benefits and alternatives for the proposed anesthesia with the patient or authorized representative who has indicated his/her understanding and acceptance.   Dental advisory given  Plan Discussed with:  CRNA  Anesthesia Plan Comments: (Risks of general anesthesia discussed including, but not limited to, sore throat, hoarse voice, chipped/damaged teeth, injury to vocal cords, nausea and vomiting, allergic reactions, lung infection, heart attack, stroke, and death. All questions answered.  Patient is a Sales promotion account executive Witness and does not consent to pRBCs or whole blood. She states that she is okay accepting plasma and platelets if necessary. She will accept albumin. She is okay with cell saver.)      Anesthesia Quick Evaluation

## 2016-02-29 ENCOUNTER — Inpatient Hospital Stay (HOSPITAL_COMMUNITY): Payer: Medicare Other

## 2016-02-29 ENCOUNTER — Inpatient Hospital Stay (HOSPITAL_COMMUNITY): Payer: Medicare Other | Admitting: Anesthesiology

## 2016-02-29 ENCOUNTER — Encounter (HOSPITAL_COMMUNITY): Payer: Self-pay | Admitting: General Practice

## 2016-02-29 ENCOUNTER — Inpatient Hospital Stay (HOSPITAL_COMMUNITY)
Admission: RE | Admit: 2016-02-29 | Discharge: 2016-03-02 | DRG: 470 | Disposition: A | Discharge status: Home Health | Payer: Medicare Other | Source: Ambulatory Visit | Attending: Orthopedic Surgery | Admitting: Orthopedic Surgery

## 2016-02-29 ENCOUNTER — Encounter (HOSPITAL_COMMUNITY)
Admission: RE | Disposition: A | Discharge status: Home Health | Payer: Self-pay | Source: Ambulatory Visit | Attending: Orthopedic Surgery

## 2016-02-29 DIAGNOSIS — M25551 Pain in right hip: Secondary | ICD-10-CM

## 2016-02-29 DIAGNOSIS — Z853 Personal history of malignant neoplasm of breast: Secondary | ICD-10-CM

## 2016-02-29 DIAGNOSIS — Z87891 Personal history of nicotine dependence: Secondary | ICD-10-CM

## 2016-02-29 DIAGNOSIS — I10 Essential (primary) hypertension: Secondary | ICD-10-CM | POA: Diagnosis not present

## 2016-02-29 DIAGNOSIS — D649 Anemia, unspecified: Secondary | ICD-10-CM | POA: Diagnosis not present

## 2016-02-29 DIAGNOSIS — E876 Hypokalemia: Secondary | ICD-10-CM | POA: Diagnosis not present

## 2016-02-29 DIAGNOSIS — K219 Gastro-esophageal reflux disease without esophagitis: Secondary | ICD-10-CM | POA: Diagnosis not present

## 2016-02-29 DIAGNOSIS — R0902 Hypoxemia: Secondary | ICD-10-CM

## 2016-02-29 DIAGNOSIS — Z9013 Acquired absence of bilateral breasts and nipples: Secondary | ICD-10-CM

## 2016-02-29 DIAGNOSIS — Z471 Aftercare following joint replacement surgery: Secondary | ICD-10-CM | POA: Diagnosis not present

## 2016-02-29 DIAGNOSIS — Z419 Encounter for procedure for purposes other than remedying health state, unspecified: Secondary | ICD-10-CM

## 2016-02-29 DIAGNOSIS — E039 Hypothyroidism, unspecified: Secondary | ICD-10-CM | POA: Diagnosis present

## 2016-02-29 DIAGNOSIS — Z96641 Presence of right artificial hip joint: Secondary | ICD-10-CM

## 2016-02-29 DIAGNOSIS — E049 Nontoxic goiter, unspecified: Secondary | ICD-10-CM | POA: Diagnosis not present

## 2016-02-29 DIAGNOSIS — R262 Difficulty in walking, not elsewhere classified: Secondary | ICD-10-CM

## 2016-02-29 DIAGNOSIS — J9811 Atelectasis: Secondary | ICD-10-CM | POA: Diagnosis not present

## 2016-02-29 DIAGNOSIS — Z09 Encounter for follow-up examination after completed treatment for conditions other than malignant neoplasm: Secondary | ICD-10-CM

## 2016-02-29 DIAGNOSIS — I959 Hypotension, unspecified: Secondary | ICD-10-CM | POA: Diagnosis not present

## 2016-02-29 DIAGNOSIS — G4733 Obstructive sleep apnea (adult) (pediatric): Secondary | ICD-10-CM | POA: Diagnosis not present

## 2016-02-29 DIAGNOSIS — Z79899 Other long term (current) drug therapy: Secondary | ICD-10-CM

## 2016-02-29 DIAGNOSIS — M169 Osteoarthritis of hip, unspecified: Secondary | ICD-10-CM | POA: Diagnosis not present

## 2016-02-29 DIAGNOSIS — M1611 Unilateral primary osteoarthritis, right hip: Principal | ICD-10-CM

## 2016-02-29 HISTORY — PX: TOTAL HIP ARTHROPLASTY: SHX124

## 2016-02-29 LAB — GLUCOSE, CAPILLARY: GLUCOSE-CAPILLARY: 133 mg/dL — AB (ref 65–99)

## 2016-02-29 SURGERY — ARTHROPLASTY, HIP, TOTAL, ANTERIOR APPROACH
Anesthesia: General | Site: Hip | Laterality: Right

## 2016-02-29 MED ORDER — ONDANSETRON HCL 4 MG/2ML IJ SOLN: 4.0000 mg | Freq: Once | INTRAMUSCULAR | Status: DC | PRN

## 2016-02-29 MED ORDER — SODIUM CHLORIDE 0.9 % IV SOLN
INTRAVENOUS | Status: DC
  Administered 2016-02-29: 17:00:00 via INTRAVENOUS

## 2016-02-29 MED ORDER — POVIDONE-IODINE 10 % EX SWAB: 2.0000 "application " | Freq: Once | CUTANEOUS | Status: DC

## 2016-02-29 MED ORDER — SUGAMMADEX SODIUM 200 MG/2ML IV SOLN
INTRAVENOUS | Status: DC | PRN
  Administered 2016-02-29: 180 mg via INTRAVENOUS

## 2016-02-29 MED ORDER — CEFAZOLIN SODIUM-DEXTROSE 2-4 GM/100ML-% IV SOLN
2.0000 g | INTRAVENOUS | Status: AC
  Administered 2016-02-29: 2 g via INTRAVENOUS

## 2016-02-29 MED ORDER — FENTANYL CITRATE (PF) 100 MCG/2ML IJ SOLN
INTRAMUSCULAR | Status: AC
  Filled 2016-02-29: qty 4

## 2016-02-29 MED ORDER — HYDROCHLOROTHIAZIDE 12.5 MG PO CAPS
12.5000 mg | ORAL_CAPSULE | Freq: Two times a day (BID) | ORAL | Status: DC
  Administered 2016-02-29: 12.5 mg via ORAL
  Filled 2016-02-29 (×3): qty 1

## 2016-02-29 MED ORDER — MENTHOL 3 MG MT LOZG: 1.0000 | LOZENGE | OROMUCOSAL | Status: DC | PRN

## 2016-02-29 MED ORDER — CHLORHEXIDINE GLUCONATE 4 % EX LIQD: 60.0000 mL | Freq: Once | CUTANEOUS | Status: DC

## 2016-02-29 MED ORDER — ONDANSETRON HCL 4 MG/2ML IJ SOLN
INTRAMUSCULAR | Status: DC | PRN
  Administered 2016-02-29: 4 mg via INTRAVENOUS

## 2016-02-29 MED ORDER — PHENYLEPHRINE 40 MCG/ML (10ML) SYRINGE FOR IV PUSH (FOR BLOOD PRESSURE SUPPORT)
PREFILLED_SYRINGE | INTRAVENOUS | Status: AC
  Filled 2016-02-29: qty 10

## 2016-02-29 MED ORDER — PROPOFOL 10 MG/ML IV BOLUS
INTRAVENOUS | Status: DC | PRN
  Administered 2016-02-29: 110 mg via INTRAVENOUS

## 2016-02-29 MED ORDER — ACETAMINOPHEN 650 MG RE SUPP: 650.0000 mg | Freq: Four times a day (QID) | RECTAL | Status: DC | PRN

## 2016-02-29 MED ORDER — FENTANYL CITRATE (PF) 100 MCG/2ML IJ SOLN
INTRAMUSCULAR | Status: AC
  Filled 2016-02-29: qty 2

## 2016-02-29 MED ORDER — EPHEDRINE 5 MG/ML INJ
INTRAVENOUS | Status: AC
  Filled 2016-02-29: qty 10

## 2016-02-29 MED ORDER — LIDOCAINE 2% (20 MG/ML) 5 ML SYRINGE
INTRAMUSCULAR | Status: AC
  Filled 2016-02-29: qty 5

## 2016-02-29 MED ORDER — DEXAMETHASONE SODIUM PHOSPHATE 10 MG/ML IJ SOLN
10.0000 mg | Freq: Once | INTRAMUSCULAR | Status: AC
  Administered 2016-03-01: 10 mg via INTRAVENOUS
  Filled 2016-02-29: qty 1

## 2016-02-29 MED ORDER — ALBUMIN HUMAN 5 % IV SOLN
INTRAVENOUS | Status: DC | PRN
  Administered 2016-02-29 (×2): via INTRAVENOUS

## 2016-02-29 MED ORDER — POVIDONE-IODINE 7.5 % EX SOLN
CUTANEOUS | Status: DC | PRN
  Administered 2016-02-29: 1 via TOPICAL

## 2016-02-29 MED ORDER — ARTIFICIAL TEARS OP OINT
TOPICAL_OINTMENT | OPHTHALMIC | Status: DC | PRN
  Administered 2016-02-29: 1 via OPHTHALMIC

## 2016-02-29 MED ORDER — SENNA 8.6 MG PO TABS
2.0000 | ORAL_TABLET | Freq: Every day | ORAL | Status: DC
  Administered 2016-02-29 – 2016-03-01 (×2): 17.2 mg via ORAL
  Filled 2016-02-29 (×2): qty 2

## 2016-02-29 MED ORDER — CEFAZOLIN SODIUM-DEXTROSE 2-4 GM/100ML-% IV SOLN
INTRAVENOUS | Status: AC
  Filled 2016-02-29: qty 100

## 2016-02-29 MED ORDER — BUPIVACAINE-EPINEPHRINE 0.5% -1:200000 IJ SOLN
INTRAMUSCULAR | Status: DC | PRN
  Administered 2016-02-29: 30 mL

## 2016-02-29 MED ORDER — HYDROMORPHONE HCL 1 MG/ML IJ SOLN
0.5000 mg | INTRAMUSCULAR | Status: DC | PRN
  Administered 2016-02-29 – 2016-03-01 (×2): 0.5 mg via INTRAVENOUS
  Filled 2016-02-29 (×2): qty 1

## 2016-02-29 MED ORDER — ALPRAZOLAM 0.5 MG PO TABS
1.0000 mg | ORAL_TABLET | Freq: Three times a day (TID) | ORAL | Status: DC | PRN
  Administered 2016-02-29 – 2016-03-01 (×2): 1 mg via ORAL
  Filled 2016-02-29 (×2): qty 2

## 2016-02-29 MED ORDER — DIPHENHYDRAMINE HCL 12.5 MG/5ML PO ELIX: 12.5000 mg | ORAL_SOLUTION | ORAL | Status: DC | PRN

## 2016-02-29 MED ORDER — 0.9 % SODIUM CHLORIDE (POUR BTL) OPTIME
TOPICAL | Status: DC | PRN
  Administered 2016-02-29: 1000 mL

## 2016-02-29 MED ORDER — SUCCINYLCHOLINE CHLORIDE 200 MG/10ML IV SOSY
PREFILLED_SYRINGE | INTRAVENOUS | Status: AC
  Filled 2016-02-29: qty 10

## 2016-02-29 MED ORDER — LACTATED RINGERS IV SOLN
INTRAVENOUS | Status: DC
  Administered 2016-02-29 (×3): via INTRAVENOUS

## 2016-02-29 MED ORDER — PHENYLEPHRINE HCL 10 MG/ML IJ SOLN
INTRAMUSCULAR | Status: DC | PRN
  Administered 2016-02-29 (×2): 80 mg via INTRAVENOUS
  Administered 2016-02-29: 120 mg via INTRAVENOUS

## 2016-02-29 MED ORDER — PHENOL 1.4 % MT LIQD: 1.0000 | OROMUCOSAL | Status: DC | PRN

## 2016-02-29 MED ORDER — KETOROLAC TROMETHAMINE 15 MG/ML IJ SOLN
7.5000 mg | Freq: Four times a day (QID) | INTRAMUSCULAR | Status: AC
  Administered 2016-02-29 – 2016-03-01 (×4): 7.5 mg via INTRAVENOUS
  Filled 2016-02-29 (×4): qty 1

## 2016-02-29 MED ORDER — DOCUSATE SODIUM 100 MG PO CAPS
100.0000 mg | ORAL_CAPSULE | Freq: Two times a day (BID) | ORAL | Status: DC
  Administered 2016-02-29 – 2016-03-02 (×4): 100 mg via ORAL
  Filled 2016-02-29 (×4): qty 1

## 2016-02-29 MED ORDER — KETOROLAC TROMETHAMINE 30 MG/ML IJ SOLN
INTRAMUSCULAR | Status: AC
  Filled 2016-02-29: qty 1

## 2016-02-29 MED ORDER — NEOSTIGMINE METHYLSULFATE 5 MG/5ML IV SOSY
PREFILLED_SYRINGE | INTRAVENOUS | Status: AC
  Filled 2016-02-29: qty 5

## 2016-02-29 MED ORDER — ROCURONIUM BROMIDE 100 MG/10ML IV SOLN
INTRAVENOUS | Status: DC | PRN
  Administered 2016-02-29: 60 mg via INTRAVENOUS
  Administered 2016-02-29: 20 mg via INTRAVENOUS

## 2016-02-29 MED ORDER — METOCLOPRAMIDE HCL 5 MG/ML IJ SOLN
5.0000 mg | Freq: Three times a day (TID) | INTRAMUSCULAR | Status: DC | PRN
  Administered 2016-02-29: 10 mg via INTRAVENOUS
  Filled 2016-02-29: qty 2

## 2016-02-29 MED ORDER — KETOROLAC TROMETHAMINE 30 MG/ML IJ SOLN
INTRAMUSCULAR | Status: DC | PRN
  Administered 2016-02-29: 30 mg

## 2016-02-29 MED ORDER — LEVOTHYROXINE SODIUM 100 MCG PO TABS
100.0000 ug | ORAL_TABLET | ORAL | Status: DC
  Administered 2016-03-01 – 2016-03-02 (×2): 100 ug via ORAL
  Filled 2016-02-29 (×2): qty 1

## 2016-02-29 MED ORDER — SODIUM CHLORIDE 0.9 % IJ SOLN
INTRAMUSCULAR | Status: DC | PRN
  Administered 2016-02-29: 30 mL

## 2016-02-29 MED ORDER — BUPIVACAINE-EPINEPHRINE (PF) 0.5% -1:200000 IJ SOLN
INTRAMUSCULAR | Status: AC
  Filled 2016-02-29: qty 30

## 2016-02-29 MED ORDER — GLYCOPYRROLATE 0.2 MG/ML IV SOSY
PREFILLED_SYRINGE | INTRAVENOUS | Status: AC
  Filled 2016-02-29: qty 3

## 2016-02-29 MED ORDER — ONDANSETRON HCL 4 MG/2ML IJ SOLN
INTRAMUSCULAR | Status: AC
  Filled 2016-02-29: qty 2

## 2016-02-29 MED ORDER — ACETAMINOPHEN 325 MG PO TABS: 650.0000 mg | ORAL_TABLET | Freq: Four times a day (QID) | ORAL | Status: DC | PRN

## 2016-02-29 MED ORDER — SUGAMMADEX SODIUM 200 MG/2ML IV SOLN
INTRAVENOUS | Status: AC
  Filled 2016-02-29: qty 2

## 2016-02-29 MED ORDER — ONDANSETRON HCL 4 MG/2ML IJ SOLN
4.0000 mg | Freq: Four times a day (QID) | INTRAMUSCULAR | Status: DC | PRN
  Administered 2016-03-01: 4 mg via INTRAVENOUS
  Filled 2016-02-29: qty 2

## 2016-02-29 MED ORDER — ONDANSETRON HCL 4 MG PO TABS: 4.0000 mg | ORAL_TABLET | Freq: Four times a day (QID) | ORAL | Status: DC | PRN

## 2016-02-29 MED ORDER — CEFAZOLIN SODIUM-DEXTROSE 2-4 GM/100ML-% IV SOLN
2.0000 g | Freq: Four times a day (QID) | INTRAVENOUS | Status: AC
  Administered 2016-02-29 – 2016-03-01 (×2): 2 g via INTRAVENOUS
  Filled 2016-02-29 (×2): qty 100

## 2016-02-29 MED ORDER — METHOCARBAMOL 500 MG PO TABS
500.0000 mg | ORAL_TABLET | Freq: Four times a day (QID) | ORAL | Status: DC | PRN
  Filled 2016-02-29: qty 1

## 2016-02-29 MED ORDER — SUCCINYLCHOLINE CHLORIDE 20 MG/ML IJ SOLN
INTRAMUSCULAR | Status: DC | PRN
  Administered 2016-02-29: 100 mg via INTRAVENOUS

## 2016-02-29 MED ORDER — FENTANYL CITRATE (PF) 100 MCG/2ML IJ SOLN
INTRAMUSCULAR | Status: DC | PRN
  Administered 2016-02-29 (×4): 50 ug via INTRAVENOUS

## 2016-02-29 MED ORDER — AMLODIPINE BESYLATE 10 MG PO TABS
10.0000 mg | ORAL_TABLET | Freq: Every day | ORAL | Status: DC
  Filled 2016-02-29 (×2): qty 1

## 2016-02-29 MED ORDER — ROCURONIUM BROMIDE 10 MG/ML (PF) SYRINGE
PREFILLED_SYRINGE | INTRAVENOUS | Status: AC
  Filled 2016-02-29: qty 10

## 2016-02-29 MED ORDER — TRANEXAMIC ACID 1000 MG/10ML IV SOLN
1000.0000 mg | Freq: Once | INTRAVENOUS | Status: AC
  Administered 2016-02-29: 1000 mg via INTRAVENOUS
  Filled 2016-02-29: qty 10

## 2016-02-29 MED ORDER — LISINOPRIL-HYDROCHLOROTHIAZIDE 20-12.5 MG PO TABS: 1.0000 | ORAL_TABLET | Freq: Two times a day (BID) | ORAL | Status: DC

## 2016-02-29 MED ORDER — METHOCARBAMOL 1000 MG/10ML IJ SOLN
500.0000 mg | Freq: Four times a day (QID) | INTRAVENOUS | Status: DC | PRN
  Filled 2016-02-29: qty 5

## 2016-02-29 MED ORDER — HYDROCODONE-ACETAMINOPHEN 5-325 MG PO TABS
1.0000 | ORAL_TABLET | ORAL | Status: DC | PRN
  Administered 2016-02-29 – 2016-03-02 (×6): 1 via ORAL
  Filled 2016-02-29 (×2): qty 2
  Filled 2016-02-29 (×5): qty 1

## 2016-02-29 MED ORDER — FENTANYL CITRATE (PF) 100 MCG/2ML IJ SOLN
25.0000 ug | INTRAMUSCULAR | Status: DC | PRN
  Administered 2016-02-29: 50 ug via INTRAVENOUS

## 2016-02-29 MED ORDER — METOCLOPRAMIDE HCL 5 MG PO TABS: 5.0000 mg | ORAL_TABLET | Freq: Three times a day (TID) | ORAL | Status: DC | PRN

## 2016-02-29 MED ORDER — POLYETHYLENE GLYCOL 3350 17 G PO PACK: 17.0000 g | PACK | Freq: Every day | ORAL | Status: DC | PRN

## 2016-02-29 MED ORDER — SODIUM CHLORIDE 0.9 % IR SOLN
Status: DC | PRN
Start: 2016-02-29 — End: 2016-02-29
  Administered 2016-02-29: 3000 mL

## 2016-02-29 MED ORDER — ASPIRIN 81 MG PO CHEW
81.0000 mg | CHEWABLE_TABLET | Freq: Two times a day (BID) | ORAL | Status: DC
  Administered 2016-02-29 – 2016-03-02 (×4): 81 mg via ORAL
  Filled 2016-02-29 (×4): qty 1

## 2016-02-29 MED ORDER — LIDOCAINE HCL (CARDIAC) 20 MG/ML IV SOLN
INTRAVENOUS | Status: DC | PRN
  Administered 2016-02-29: 100 mg via INTRAVENOUS

## 2016-02-29 MED ORDER — LISINOPRIL 20 MG PO TABS
20.0000 mg | ORAL_TABLET | Freq: Two times a day (BID) | ORAL | Status: DC
  Administered 2016-02-29: 20 mg via ORAL
  Filled 2016-02-29 (×3): qty 1

## 2016-02-29 SURGICAL SUPPLY — 55 items
ALCOHOL ISOPROPYL (RUBBING) (MISCELLANEOUS) ×2 IMPLANT
BLADE SURG ROTATE 9660 (MISCELLANEOUS) IMPLANT
CAPT HIP TOTAL 2 ×2 IMPLANT
CHLORAPREP W/TINT 26ML (MISCELLANEOUS) ×2 IMPLANT
COVER SURGICAL LIGHT HANDLE (MISCELLANEOUS) ×2 IMPLANT
DERMABOND ADHESIVE PROPEN (GAUZE/BANDAGES/DRESSINGS) ×1
DERMABOND ADVANCED (GAUZE/BANDAGES/DRESSINGS) ×2
DERMABOND ADVANCED .7 DNX12 (GAUZE/BANDAGES/DRESSINGS) ×2 IMPLANT
DERMABOND ADVANCED .7 DNX6 (GAUZE/BANDAGES/DRESSINGS) ×1 IMPLANT
DRAPE C-ARM 42X72 X-RAY (DRAPES) ×2 IMPLANT
DRAPE IMP U-DRAPE 54X76 (DRAPES) ×4 IMPLANT
DRAPE STERI IOBAN 125X83 (DRAPES) ×2 IMPLANT
DRAPE U-SHAPE 47X51 STRL (DRAPES) ×6 IMPLANT
DRSG AQUACEL AG ADV 3.5X10 (GAUZE/BANDAGES/DRESSINGS) ×2 IMPLANT
ELECT BLADE 4.0 EZ CLEAN MEGAD (MISCELLANEOUS) ×2
ELECT REM PT RETURN 9FT ADLT (ELECTROSURGICAL) ×2
ELECTRODE BLDE 4.0 EZ CLN MEGD (MISCELLANEOUS) ×1 IMPLANT
ELECTRODE REM PT RTRN 9FT ADLT (ELECTROSURGICAL) ×1 IMPLANT
EVACUATOR 1/8 PVC DRAIN (DRAIN) IMPLANT
GLOVE BIO SURGEON STRL SZ8.5 (GLOVE) ×4 IMPLANT
GLOVE BIOGEL PI IND STRL 8.5 (GLOVE) ×1 IMPLANT
GLOVE BIOGEL PI INDICATOR 8.5 (GLOVE) ×1
GOWN STRL REUS W/ TWL LRG LVL3 (GOWN DISPOSABLE) ×2 IMPLANT
GOWN STRL REUS W/TWL 2XL LVL3 (GOWN DISPOSABLE) ×4 IMPLANT
GOWN STRL REUS W/TWL LRG LVL3 (GOWN DISPOSABLE) ×2
HANDPIECE INTERPULSE COAX TIP (DISPOSABLE) ×1
HOOD PEEL AWAY FACE SHEILD DIS (HOOD) ×4 IMPLANT
KIT BASIN OR (CUSTOM PROCEDURE TRAY) ×2 IMPLANT
KIT ROOM TURNOVER OR (KITS) ×2 IMPLANT
MANIFOLD NEPTUNE II (INSTRUMENTS) ×2 IMPLANT
MARKER SKIN DUAL TIP RULER LAB (MISCELLANEOUS) ×4 IMPLANT
NEEDLE SPNL 18GX3.5 QUINCKE PK (NEEDLE) ×2 IMPLANT
NS IRRIG 1000ML POUR BTL (IV SOLUTION) ×2 IMPLANT
PACK TOTAL JOINT (CUSTOM PROCEDURE TRAY) ×2 IMPLANT
PACK UNIVERSAL I (CUSTOM PROCEDURE TRAY) ×2 IMPLANT
PAD ARMBOARD 7.5X6 YLW CONV (MISCELLANEOUS) ×2 IMPLANT
SAW OSC TIP CART 19.5X105X1.3 (SAW) ×2 IMPLANT
SEALER BIPOLAR AQUA 6.0 (INSTRUMENTS) ×2 IMPLANT
SET HNDPC FAN SPRY TIP SCT (DISPOSABLE) ×1 IMPLANT
SOLUTION BETADINE 4OZ (MISCELLANEOUS) ×2 IMPLANT
SUCTION FRAZIER HANDLE 10FR (MISCELLANEOUS) ×1
SUCTION TUBE FRAZIER 10FR DISP (MISCELLANEOUS) ×1 IMPLANT
SUT ETHIBOND NAB CT1 #1 30IN (SUTURE) ×4 IMPLANT
SUT MNCRL AB 3-0 PS2 18 (SUTURE) ×2 IMPLANT
SUT MON AB 2-0 CT1 36 (SUTURE) ×4 IMPLANT
SUT VIC AB 1 CT1 27 (SUTURE) ×1
SUT VIC AB 1 CT1 27XBRD ANBCTR (SUTURE) ×1 IMPLANT
SUT VIC AB 2-0 CT1 27 (SUTURE) ×1
SUT VIC AB 2-0 CT1 TAPERPNT 27 (SUTURE) ×1 IMPLANT
SUT VLOC 180 0 24IN GS25 (SUTURE) ×4 IMPLANT
SYR 50ML LL SCALE MARK (SYRINGE) ×2 IMPLANT
TOWEL OR 17X24 6PK STRL BLUE (TOWEL DISPOSABLE) ×2 IMPLANT
TOWEL OR 17X26 10 PK STRL BLUE (TOWEL DISPOSABLE) ×2 IMPLANT
TRAY FOLEY CATH 16FR SILVER (SET/KITS/TRAYS/PACK) IMPLANT
WATER STERILE IRR 1000ML POUR (IV SOLUTION) IMPLANT

## 2016-02-29 NOTE — Op Note (Signed)
OPERATIVE REPORT  SURGEON: Rod Can, MD   ASSISTANT: Ky Barban, RNFA.  PREOPERATIVE DIAGNOSIS: Right hip arthritis.   POSTOPERATIVE DIAGNOSIS: Right hip arthritis.   PROCEDURE: Right total hip arthroplasty, anterior approach.   IMPLANTS: DePuy Tri Lock stem, size 3, hi offset. DePuy Pinnacle Cup, size 52 mm. DePuy Altrx liner, size 32 by 52 mm,  neutral. DePuy Biolox ceramic head ball, size 32 + 5 mm.  ANESTHESIA:  General  ESTIMATED BLOOD LOSS: 350 mL.   ANTIBIOTICS: 2 g Ancef.  DRAINS: None.  COMPLICATIONS: None.   CONDITION: PACU - hemodynamically stable.Marland Kitchen   BRIEF CLINICAL NOTE: Karen [name] is a 70 y.o. female with a long-standing history of Right hip arthritis. After failing conservative management, the patient was indicated for total hip arthroplasty. The risks, benefits, and alternatives to the procedure were explained, and the patient elected to proceed.  PROCEDURE IN DETAIL: Surgical site was marked by myself. Spinal anesthesia was obtained in the pre-op holding area. Once inside the operative room, a foley catheter was inserted. The patient was then positioned on the Hana table. All bony prominences were well padded. The hip was prepped and draped in the normal sterile surgical fashion. A time-out was called verifying side and site of surgery. The patient received IV antibiotics within 60 minutes of beginning the procedure.  The direct anterior approach to the hip was performed through the Hueter interval. Lateral femoral circumflex vessels were treated with the Auqumantys. The anterior capsule was exposed and an inverted T capsulotomy was made.The femoral neck cut was made to the level of the templated cut. A corkscrew was placed into the head and the head was removed. The femoral head was found to have eburnated bone. The head was passed to the back table and was measured.  Acetabular exposure was achieved, and the pulvinar and labrum were  excised. Sequental reaming of the acetabulum was then performed up to a size 51 mm reamer. A 52 mm cup was then opened and impacted into place at approximately 40 degrees of abduction and 20 degrees of anteversion. The final polyethylene liner was impacted into place and acetabular osteophytes were removed.   I then gained femoral exposure taking care to protect the abductors and greater trochanter. This was performed using standard external rotation, extension, and adduction. The capsule was peeled off the inner aspect of the greater trochanter, taking care to preserve the short external rotators. A cookie cutter was used to enter the femoral canal, and then the femoral canal finder was placed. Sequential broaching was performed up to a size 3. Calcar planer was used on the femoral neck remnant. I placed a hi offset neck and a trial head ball. The hip was reduced. Leg lengths and offset were checked fluoroscopically. The hip was dislocated and trial components were removed. The final implants were placed, and the hip was reduced.  Fluoroscopy was used to confirm component position and leg lengths. At 90 degrees of external rotation and full extension, the hip was stable to an anterior directed force.  The wound was copiously irrigated with a dilute betadine solution followed by normal saline. Marcaine solution was injected into the periarticular soft tissue. The wound was closed in layers using #1 Vicryl and V-Loc for the fascia, 2-0 Vicryl for the subcutaneous fat, 2-0 Monocryl for the deep dermal layer, 3-0 running Monocryl subcuticular stitch, and Dermabond for the skin. Once the glue was fully dried, an Aquacell Ag dressing was applied. The patient was transported to  the recovery room in stable condition. Sponge, needle, and instrument counts were correct at the end of the case x2. The patient tolerated the procedure well and there were no known complications.

## 2016-02-29 NOTE — Interval H&P Note (Signed)
History and Physical Interval Note:  02/29/2016 11:37 AM  Karen [name]  has presented today for surgery, with the diagnosis of DEGENERATIVE JOINT RIGHT HIP  The various methods of treatment have been discussed with the patient and family. After consideration of risks, benefits and other options for treatment, the patient has consented to  Procedure(s): TOTAL HIP ARTHROPLASTY ANTERIOR APPROACH (Right) as a surgical intervention .  The patient's history has been reviewed, patient examined, no change in status, stable for surgery.  I have reviewed the patient's chart and labs.  Questions were answered to the patient's satisfaction.     Elijah Phommachanh, Horald Pollen

## 2016-02-29 NOTE — H&P (View-Only) (Signed)
TOTAL HIP ADMISSION H&P  Patient is admitted for right total hip arthroplasty.  Subjective:  Chief Complaint: right hip pain  HPI: Karen [name], 70 y.o. female, has a history of pain and functional disability in the right hip(s) due to arthritis and patient has failed non-surgical conservative treatments for greater than 12 weeks to include NSAID's and/or analgesics, flexibility and strengthening excercises, use of assistive devices, weight reduction as appropriate and activity modification.  Onset of symptoms was gradual starting 2 years ago with rapidlly worsening course since that time.The patient noted no past surgery on the right hip(s).  Patient currently rates pain in the right hip at 10 out of 10 with activity. Patient has night pain, worsening of pain with activity and weight bearing, pain that interfers with activities of daily living, pain with passive range of motion and crepitus. Patient has evidence of subchondral cysts, subchondral sclerosis, periarticular osteophytes and joint space narrowing by imaging studies. This condition presents safety issues increasing the risk of falls.There is no current active infection.  Patient Active Problem List   Diagnosis Date Noted  . Wound infection ([place]) 12/17/2014  . Wound infection after surgery 11/07/2014  . Spinal stenosis 09/25/2014  . Hematochezia 03/19/2013  . LLQ pain 01/24/2011  . Umbilical hernia [id number]  . Rectal bleed 01/24/2011  . GERD (gastroesophageal reflux disease) 01/24/2011  . Dysphagia 01/24/2011  . DIVERTICULITIS-COLON 08/30/2010   Past Medical History:  Diagnosis Date  . Breast cancer 2004   bilateral mastectomy-all left lymph nodes removed; chemo. Patient states she had "spot" on the right breast as well.  . Diverticulitis   . GERD (gastroesophageal reflux disease)    since gallbladder removed, no more GERD  . Goiter   . HTN (hypertension)   . Hypothyroidism   . Sleep apnea    has never been tested.  was  dx with cancer first and had this problem on the back burner  . Tubular adenoma     Past Surgical History:  Procedure Laterality Date  . ABDOMINAL HYSTERECTOMY    . bilateral mastectomy w/reconstruction  2004  . BREAST SURGERY    . CHOLECYSTECTOMY    . COLONOSCOPY  2008   colon with few diverticula in sigmoid colon  . COLONOSCOPY  02/16/2011   Dr. Gala Romney- colon and rectal polyps= tubular adenoma, L sided diverticulosis.  . DEBRIDEMENT AND CLOSURE WOUND N/A 12/17/2014   Procedure: CLOSURE WOUND LUMBAR SPINE;  Surgeon: Melina Schools, MD;  Location: [place];  Service: Orthopedics;  Laterality: N/A;  . ESOPHAGOGASTRODUODENOSCOPY  2008   lipoma distal esophagus, gastritis   . ESOPHAGOGASTRODUODENOSCOPY  02/16/2011   Dr. Gala Romney- normal esophagus, small hiatal hernia o/w normal stomach. normal first and second portion of the duodenum  . HERNIA REPAIR     umbilical hernia was repaired  . INJECTION KNEE Right 11/07/2014   Procedure: KNEE INJECTION;  Surgeon: Melina Schools, MD;  Location: [place];  Service: Orthopedics;  Laterality: Right;  . LUMBAR LAMINECTOMY/DECOMPRESSION MICRODISCECTOMY N/A 09/25/2014   Procedure: DECOMPRESSION L3-L5 WITH INSERTION OF COFLEX   ( 2 LEVELS);  Surgeon: Melina Schools, MD;  Location: [place];  Service: Orthopedics;  Laterality: N/A;  . LUMBAR LAMINECTOMY/DECOMPRESSION MICRODISCECTOMY N/A 11/07/2014   Procedure: IRRIGATION AND DEBRIDEMENT BACK WOUND, PLACEMENT OF ANTIBIOTIC BEADS;  Surgeon: Melina Schools, MD;  Location: [place];  Service: Orthopedics;  Laterality: N/A;  . LUMBAR WOUND DEBRIDEMENT N/A 12/01/2014   Procedure: IRRIGATION AND DEBRIDEMENT OF LUMBAR WOUND WITH VAC PLACEMENT;  Surgeon: Melina Schools,  MD;  Location: [place];  Service: Orthopedics;  Laterality: N/A;  . LUMBAR WOUND DEBRIDEMENT N/A 12/03/2014   Procedure: WOUND VAC DRESSING CHANGE ;  Surgeon: Melina Schools, MD;  Location: [place];  Service: Orthopedics;  Laterality: N/A;  Venia Minks DILATION  02/16/2011   Procedure:  Venia Minks DILATION;  Surgeon: Daneil Dolin, MD;  [facility];  Service: Endoscopy;  Laterality: N/A;  . MASTECTOMY     bilateral 2004 left side had 17 nodes removed  . PARTIAL HYSTERECTOMY    . SAVORY DILATION  02/16/2011   Procedure: SAVORY DILATION;  Surgeon: Daneil Dolin, MD;  [facility];  Service: Endoscopy;  Laterality: N/A;     (Not in a hospital admission) Allergies  Allergen Reactions  . Sulfonamide Derivatives Anaphylaxis  . Morphine And Related Swelling    Social History  Substance Use Topics  . Smoking status: Former Smoker    Packs/day: 1.00    Years: 20.00    Quit date: 09/24/1995  . Smokeless tobacco: Not on file     Comment: Quit 1997  . Alcohol use No    Family History  Problem Relation Age of Onset  . Pancreatic cancer Mother     deceased  . Aneurysm Father     deceased, aortic  . Colon cancer Neg Hx   . Breast cancer Sister     deceased 8 years after initial diagnosis  . Breast cancer Sister     survivor  . Diverticulitis Brother     s/p partial colon resection     Review of Systems  Constitutional: Negative.   HENT: Negative.   Eyes: Negative.   Respiratory: Negative.   Cardiovascular: Negative.   Gastrointestinal: Negative.   Genitourinary: Negative.   Musculoskeletal: Positive for joint pain.  Skin: Negative.   Neurological: Negative.   Endo/Heme/Allergies: Negative.   Psychiatric/Behavioral: Negative.     Objective:  Physical Exam  Constitutional: She is oriented to person, place, and time. She appears well-developed and well-nourished.  HENT:  Head: Normocephalic and atraumatic.  Eyes: Conjunctivae and EOM are normal. Pupils are equal, round, and reactive to light.  Neck: Normal range of motion. Neck supple.  Cardiovascular: Normal rate, regular rhythm and intact distal pulses.   Respiratory: Effort normal and breath sounds normal. No respiratory distress.  GI: Soft. Bowel sounds are normal. She exhibits no  distension.  Genitourinary:  Genitourinary Comments: deferred  Musculoskeletal:       Right hip: She exhibits decreased range of motion, bony tenderness and crepitus.  Neurological: She is alert and oriented to person, place, and time. She has normal reflexes.  Skin: Skin is warm and dry.  Psychiatric: She has a normal mood and affect. Her behavior is normal. Judgment and thought content normal.    Vital signs in last 24 hours: @VSRANGES @  Labs:   Estimated body mass index is 30.66 kg/m as calculated from the following:   Height as of 12/17/14: 5\' 3"  (1.6 m).   Weight as of 12/17/14: 78.5 kg (173 lb 1 oz).   Imaging Review Plain radiographs demonstrate severe degenerative joint disease of the right hip(s). The bone quality appears to be adequate for age and reported activity level.  Assessment/Plan:  End stage arthritis, right hip(s)  The patient history, physical examination, clinical judgement of the provider and imaging studies are consistent with end stage degenerative joint disease of the right hip(s) and total hip arthroplasty is deemed medically necessary. The treatment options including  medical management, injection therapy, arthroscopy and arthroplasty were discussed at length. The risks and benefits of total hip arthroplasty were presented and reviewed. The risks due to aseptic loosening, infection, stiffness, dislocation/subluxation,  thromboembolic complications and other imponderables were discussed.  The patient acknowledged the explanation, agreed to proceed with the plan and consent was signed. Patient is being admitted for inpatient treatment for surgery, pain control, PT, OT, prophylactic antibiotics, VTE prophylaxis, progressive ambulation and ADL's and discharge planning.The patient is planning to be discharged home with home health services. Jehovah's witness - hgb pending

## 2016-02-29 NOTE — Anesthesia Postprocedure Evaluation (Signed)
Anesthesia Post Note  Patient: CHENIN BLAISDELL  Procedure(s) Performed: Procedure(s) (LRB): TOTAL HIP ARTHROPLASTY ANTERIOR APPROACH (Right)  Patient location during evaluation: PACU Anesthesia Type: General Level of consciousness: awake and alert Pain management: pain level controlled Vital Signs Assessment: post-procedure vital signs reviewed and stable Respiratory status: spontaneous breathing, nonlabored ventilation and respiratory function stable Cardiovascular status: blood pressure returned to baseline and stable Postop Assessment: no signs of nausea or vomiting Anesthetic complications: no    Last Vitals:  Vitals:   02/29/16 1620 02/29/16 1641  BP:  (!) 102/54  Pulse: 77 68  Resp: 18 20  Temp: 36.2 C 36.2 C    Last Pain:  Vitals:   02/29/16 1550  TempSrc:   PainSc: 10-Worst pain ever                 Nilda Simmer

## 2016-02-29 NOTE — Anesthesia Procedure Notes (Signed)
Procedure Name: Intubation Date/Time: 02/29/2016 12:59 PM Performed by: Scheryl Darter Pre-anesthesia Checklist: Patient identified, Emergency Drugs available, Suction available and Patient being monitored Patient Re-evaluated:Patient Re-evaluated prior to inductionOxygen Delivery Method: Circle System Utilized Preoxygenation: Pre-oxygenation with 100% oxygen Intubation Type: IV induction Ventilation: Mask ventilation without difficulty Laryngoscope Size: Mac and 3 Grade View: Grade II Tube type: Oral Tube size: 7.5 mm Number of attempts: 1 Airway Equipment and Method: Stylet and Oral airway Placement Confirmation: ETT inserted through vocal cords under direct vision,  positive ETCO2 and breath sounds checked- equal and bilateral Tube secured with: Tape Dental Injury: Teeth and Oropharynx as per pre-operative assessment

## 2016-02-29 NOTE — Transfer of Care (Signed)
Immediate Anesthesia Transfer of Care Note  Patient: Karen [name]  Procedure(s) Performed: Procedure(s): TOTAL HIP ARTHROPLASTY ANTERIOR APPROACH (Right)  Patient Location: PACU  Anesthesia Type:General  Level of Consciousness: awake, alert  and oriented  Airway & Oxygen Therapy: Patient Spontanous Breathing and Patient connected to nasal cannula oxygen  Post-op Assessment: Report given to RN, Post -op Vital signs reviewed and stable and Patient moving all extremities  Post vital signs: Reviewed and stable  Last Vitals:  Vitals:   02/29/16 0959 02/29/16 1509  BP: 133/62   Pulse:    Resp:    Temp:  36.7 C    Last Pain:  Vitals:   02/29/16 1011  TempSrc:   PainSc: 5       Patients Stated Pain Goal: 1 ([id number])  Complications: No apparent anesthesia complications

## 2016-03-01 ENCOUNTER — Inpatient Hospital Stay (HOSPITAL_COMMUNITY): Payer: Medicare Other

## 2016-03-01 ENCOUNTER — Encounter (HOSPITAL_COMMUNITY): Payer: Self-pay | Admitting: Radiology

## 2016-03-01 DIAGNOSIS — M1611 Unilateral primary osteoarthritis, right hip: Principal | ICD-10-CM

## 2016-03-01 DIAGNOSIS — E876 Hypokalemia: Secondary | ICD-10-CM

## 2016-03-01 DIAGNOSIS — D649 Anemia, unspecified: Secondary | ICD-10-CM

## 2016-03-01 DIAGNOSIS — R0902 Hypoxemia: Secondary | ICD-10-CM

## 2016-03-01 DIAGNOSIS — Z96641 Presence of right artificial hip joint: Secondary | ICD-10-CM

## 2016-03-01 LAB — BASIC METABOLIC PANEL
Anion gap: 6 (ref 5–15)
BUN: 12 mg/dL (ref 6–20)
CALCIUM: 8.1 mg/dL — AB (ref 8.9–10.3)
CHLORIDE: 104 mmol/L (ref 101–111)
CO2: 30 mmol/L (ref 22–32)
CREATININE: 0.82 mg/dL (ref 0.44–1.00)
GFR calc Af Amer: >60 mL/min (ref >60)
GFR calc non Af Amer: >60 mL/min (ref >60)
Glucose, Bld: 108 mg/dL — ABNORMAL HIGH (ref 65–99)
Potassium: 3.2 mmol/L — ABNORMAL LOW (ref 3.5–5.1)
SODIUM: 140 mmol/L (ref 135–145)

## 2016-03-01 LAB — CBC
HCT: 31.2 % — ABNORMAL LOW (ref 36.0–46.0)
HEMATOCRIT: 28.7 % — AB (ref 36.0–46.0)
HEMOGLOBIN: 9.2 g/dL — AB (ref 12.0–15.0)
HEMOGLOBIN: 9.8 g/dL — AB (ref 12.0–15.0)
MCH: 30.9 pg (ref 26.0–34.0)
MCH: 30.6 pg (ref 26.0–34.0)
MCHC: 32.1 g/dL (ref 30.0–36.0)
MCHC: 31.4 g/dL (ref 30.0–36.0)
MCV: 96.3 fL (ref 78.0–100.0)
MCV: 97.5 fL (ref 78.0–100.0)
Platelets: 177 10*3/uL (ref 150–400)
Platelets: 187 10*3/uL (ref 150–400)
RBC: 2.98 MIL/uL — ABNORMAL LOW (ref 3.87–5.11)
RBC: 3.2 MIL/uL — AB (ref 3.87–5.11)
RDW: 14 % (ref 11.5–15.5)
RDW: 13.9 % (ref 11.5–15.5)
WBC: 10 10*3/uL (ref 4.0–10.5)
WBC: 12.5 10*3/uL — ABNORMAL HIGH (ref 4.0–10.5)

## 2016-03-01 LAB — MAGNESIUM: Magnesium: 1.6 mg/dL — ABNORMAL LOW (ref 1.7–2.4)

## 2016-03-01 LAB — TROPONIN I: Troponin I: <0.03 ng/mL (ref <0.03)

## 2016-03-01 LAB — BRAIN NATRIURETIC PEPTIDE: B Natriuretic Peptide: 176.5 pg/mL — ABNORMAL HIGH (ref 0.0–100.0)

## 2016-03-01 MED ORDER — IOPAMIDOL (ISOVUE-370) INJECTION 76%
INTRAVENOUS | Status: AC
  Administered 2016-03-01: 100 mL
  Filled 2016-03-01: qty 100

## 2016-03-01 MED ORDER — ASPIRIN 81 MG PO CHEW: 81.0000 mg | CHEWABLE_TABLET | Freq: Two times a day (BID) | ORAL | 1 refills | Status: DC

## 2016-03-01 MED ORDER — HYDROCODONE-ACETAMINOPHEN 5-325 MG PO TABS: 1.0000 | ORAL_TABLET | ORAL | 0 refills | Status: DC | PRN

## 2016-03-01 MED ORDER — POTASSIUM CHLORIDE CRYS ER 20 MEQ PO TBCR
40.0000 meq | EXTENDED_RELEASE_TABLET | Freq: Once | ORAL | Status: AC
  Administered 2016-03-01: 40 meq via ORAL
  Filled 2016-03-01: qty 2

## 2016-03-01 NOTE — Evaluation (Signed)
Physical Therapy Evaluation Patient Details Name: Karen [name] MRN: [id number] DOB: Sep 22, 1945 Today's Date: 03/01/2016   History of Present Illness  Karen [name] is an 70 y.o. female breast cancer status post bilateral mastectomy, diverticulosis, GERD, thyroid dysfunction, HTN, OSA presenting to [facility] for elective total hip arthroplasty from an anterior approach. Performed on 02/29/2016  Clinical Impression  Patient presents with limited tolerance to PT this am due to SOB and awaiting CT chest to R/O PE.  She presents with decreased independence with mobility due to deficits listed in PT problem list.  She will benefit from skilled PT in the acute setting to allow return home with family support and follow up HHPT.     Follow Up Recommendations Home health PT    Equipment Recommendations  None recommended by PT    Recommendations for Other Services       Precautions / Restrictions Precautions Precautions: Fall Restrictions Weight Bearing Restrictions: Yes RLE Weight Bearing: Weight bearing as tolerated      Mobility  Bed Mobility               General bed mobility comments: NT pt in chair awaiting transport to CT to R/O PE  Transfers                    Ambulation/Gait                Stairs            Wheelchair Mobility    Modified Rankin (Stroke Patients Only)       Balance                                             Pertinent Vitals/Pain Pain Assessment: Faces Faces Pain Scale: Hurts whole lot Pain Location: R hip with movement Pain Descriptors / Indicators: Discomfort;Sore;Operative site guarding Pain Intervention(s): Monitored during session;Limited activity within patient's tolerance;Ice applied    Home Living Family/patient expects to be discharged to:: Private residence Living Arrangements: Spouse/significant other;Children Available Help at Discharge: Family;Available 24 hours/day Type  of Home: House Home Access: Level entry     Home Layout: Two level;Able to live on main level with bedroom/bathroom Home Equipment: Bedside commode;Grab bars - tub/shower;Grab bars - toilet;Hand held shower head;Walker - 2 wheels;Cane - single point;Tub bench      Prior Function Level of Independence: Independent with assistive device(s)         Comments: used RW when cooking     Hand Dominance   Dominant Hand: Right    Extremity/Trunk Assessment   Upper Extremity Assessment: Generalized weakness           Lower Extremity Assessment: RLE deficits/detail RLE Deficits / Details: ankle AROM WFL, hip flexion limited with pain to about 80, strength knee extension 3/5, hip flexion 2-/5       Communication   Communication: No difficulties  Cognition Arousal/Alertness: Awake/alert Behavior During Therapy: WFL for tasks assessed/performed Overall Cognitive Status: Within Functional Limits for tasks assessed                      General Comments      Exercises     Assessment/Plan    PT Assessment Patient needs continued PT services  PT Problem List Decreased strength;Decreased range of motion;Decreased activity tolerance;Decreased mobility;Pain;Decreased balance;Decreased knowledge of  use of DME          PT Treatment Interventions DME instruction;Gait training;Functional mobility training;Balance training;Therapeutic exercise;Therapeutic activities;Patient/family education    PT Goals (Current goals can be found in the Care Plan section)  Acute Rehab PT Goals Patient Stated Goal: To return home PT Goal Formulation: With patient/family Time For Goal Achievement: 03/05/16 Potential to Achieve Goals: Good    Frequency 7X/week   Barriers to discharge        Co-evaluation               End of Session Equipment Utilized During Treatment: Oxygen Activity Tolerance: Treatment limited secondary to medical complications (Comment) (SOB) Patient  left: in chair;with family/visitor present           Time: 1218-1228 PT Time Calculation (min) (ACUTE ONLY): 10 min   Charges:   PT Evaluation $PT Eval Moderate Complexity: 1 Procedure     PT G CodesReginia [name] 24-Mar-2016, 2:01 PM  Karen [name], [place] 03/24/16

## 2016-03-01 NOTE — Progress Notes (Signed)
   Subjective:  Patient reports pain as mild to moderate.  No c/o.  Objective:   VITALS:   Vitals:   02/29/16 1641 02/29/16 2023 03/01/16 0040 03/01/16 0523  BP: (!) 102/54 (!) 104/48 (!) 95/47 (!) 113/54  Pulse: 68 81 78 82  Resp: 20 18 17 17   Temp: 97.2 F (36.2 C) 98.5 F (36.9 C) 97.6 F (36.4 C) 98.3 F (36.8 C)  TempSrc:  Oral Oral Oral  SpO2: 94% 93% 93% 92%  Weight:      Height:        ABD soft Sensation intact distally Intact pulses distally Dorsiflexion/Plantar flexion intact Incision: dressing C/D/I Compartment soft   Lab Results  Component Value Date   WBC 10.0 03/01/2016   HGB 9.2 (L) 03/01/2016   HCT 28.7 (L) 03/01/2016   MCV 96.3 03/01/2016   PLT 177 03/01/2016   BMET    Component Value Date/Time   NA 140 03/01/2016 0405   K 3.2 (L) 03/01/2016 0405   CL 104 03/01/2016 0405   CO2 30 03/01/2016 0405   GLUCOSE 108 (H) 03/01/2016 0405   BUN 12 03/01/2016 0405   CREATININE 0.82 03/01/2016 0405   CREATININE 0.82 03/19/2013 1535   CALCIUM 8.1 (L) 03/01/2016 0405   GFRNONAA >60 03/01/2016 0405   GFRAA >60 03/01/2016 0405     Assessment/Plan: 1 Day Post-Op   Principal Problem:   Primary osteoarthritis of right hip   WBAT with walker DVT ppx: ASA, SCDs, TEDs PO pain control PT/OT Dispo: d/c home with HHPT    Brinlyn Cena, Horald Pollen 03/01/2016, 7:58 AM   Rod Can, MD Cell [phone]

## 2016-03-01 NOTE — Consult Note (Signed)
Medical Consultation   Karen [name]  [id number]  DOB: 11/16/1945  DOA: 02/29/2016  PCP: Wende Neighbors, MD   Outpatient Specialists: ortho   Requesting physician: Dr. Lyla Glassing - ortho  Reason for consultation: Hypotension, hypoxemia   History of Present Illness: Karen [name] is an 70 y.o. female breast cancer status post bilateral mastectomy, diverticulosis, GERD, thyroid dysfunction, HTN, OSA presenting to [facility] for elective total hip arthroplasty from an anterior approach. Performed on 02/29/2016. Patient is POD #1. In the early morning hours patient was noted to be hypoxic on pulse ox is in the upper 80s. Patient plus pressure also noted to be low. Nursing staff withheld patient's home blood pressure medications this morning. Patient denies any chest pain, palpitations, shortness of breath. Patient does endorse feeling more fatigued today along w/ R hip pain. Denies LE swelling. She denies any history of CHF, DVT/PE, lung disease such as pulmonary fibrosis or COPD. Pt does not smoke.    Review of Systems:  ROS As per HPI otherwise 10 point review of systems negative.    Past Medical History: Past Medical History:  Diagnosis Date  . Arthritis    right hip  . Breast cancer [facility]) 2004   bilateral mastectomy-all left lymph nodes removed; chemo. Patient states she had "spot" on the right breast as well.  . Diverticulitis   . GERD (gastroesophageal reflux disease)    since gallbladder removed, no more GERD  . Goiter   . HTN (hypertension)   . Hypothyroidism   . Pneumonia   . Sleep apnea    has never been tested.  was dx with cancer first and had this problem on the back burner  . Tubular adenoma     Past Surgical History: Past Surgical History:  Procedure Laterality Date  . ABDOMINAL HYSTERECTOMY    . bilateral mastectomy w/reconstruction  2004  . BREAST SURGERY    . CHOLECYSTECTOMY    . COLONOSCOPY  2008   colon with few diverticula in  sigmoid colon  . COLONOSCOPY  02/16/2011   Dr. Gala Romney- colon and rectal polyps= tubular adenoma, L sided diverticulosis.  . DEBRIDEMENT AND CLOSURE WOUND N/A 12/17/2014   Procedure: CLOSURE WOUND LUMBAR SPINE;  Surgeon: Melina Schools, MD;  Location: [place];  Service: Orthopedics;  Laterality: N/A;  . DILATION AND CURETTAGE OF UTERUS    . ESOPHAGOGASTRODUODENOSCOPY  2008   lipoma distal esophagus, gastritis   . ESOPHAGOGASTRODUODENOSCOPY  02/16/2011   Dr. Gala Romney- normal esophagus, small hiatal hernia o/w normal stomach. normal first and second portion of the duodenum  . HERNIA REPAIR     umbilical hernia was repaired  . INJECTION KNEE Right 11/07/2014   Procedure: KNEE INJECTION;  Surgeon: Melina Schools, MD;  Location: [place];  Service: Orthopedics;  Laterality: Right;  . LUMBAR LAMINECTOMY/DECOMPRESSION MICRODISCECTOMY N/A 09/25/2014   Procedure: DECOMPRESSION L3-L5 WITH INSERTION OF COFLEX   ( 2 LEVELS);  Surgeon: Melina Schools, MD;  Location: [place];  Service: Orthopedics;  Laterality: N/A;  . LUMBAR LAMINECTOMY/DECOMPRESSION MICRODISCECTOMY N/A 11/07/2014   Procedure: IRRIGATION AND DEBRIDEMENT BACK WOUND, PLACEMENT OF ANTIBIOTIC BEADS;  Surgeon: Melina Schools, MD;  Location: [place];  Service: Orthopedics;  Laterality: N/A;  . LUMBAR WOUND DEBRIDEMENT N/A 12/01/2014   Procedure: IRRIGATION AND DEBRIDEMENT OF LUMBAR WOUND WITH VAC PLACEMENT;  Surgeon: Melina Schools, MD;  Location: [place];  Service: Orthopedics;  Laterality: N/A;  .  LUMBAR WOUND DEBRIDEMENT N/A 12/03/2014   Procedure: WOUND VAC DRESSING CHANGE ;  Surgeon: Melina Schools, MD;  Location: [place];  Service: Orthopedics;  Laterality: N/A;  Venia Minks DILATION  02/16/2011   Procedure: Venia Minks DILATION;  Surgeon: Daneil Dolin, MD;  [facility];  Service: Endoscopy;  Laterality: N/A;  . MASTECTOMY     bilateral 2004 left side had 17 nodes removed  . PARTIAL HYSTERECTOMY    . SAVORY DILATION  02/16/2011   Procedure: SAVORY DILATION;   Surgeon: Daneil Dolin, MD;  [facility];  Service: Endoscopy;  Laterality: N/A;     Allergies:   Allergies  Allergen Reactions  . Sulfonamide Derivatives Anaphylaxis  . Morphine And Related Swelling    SWELLING REACTION UNSPECIFIED      Social History:  reports that she quit smoking about 20 years ago. She has a 20.00 pack-year smoking history. She has never used smokeless tobacco. She reports that she does not drink alcohol or use drugs.   Family History: Family History  Problem Relation Age of Onset  . Pancreatic cancer Mother     deceased  . Aneurysm Father     deceased, aortic  . Breast cancer Sister     deceased 8 years after initial diagnosis  . Breast cancer Sister     survivor  . Diverticulitis Brother     s/p partial colon resection  . Suicidality Brother   . Colon cancer Neg Hx      Physical Exam: Vitals:   03/01/16 0040 03/01/16 0523 03/01/16 0935 03/01/16 1349  BP: (!) 95/47 (!) 113/54 (!) 90/55 (!) 107/43  Pulse: 78 82 79   Resp: 17 17    Temp: 97.6 F (36.4 C) 98.3 F (36.8 C) 98.5 F (36.9 C)   TempSrc: Oral Oral    SpO2: 93% 92%    Weight:      Height:        General:  Appears calm and comfortable Eyes:  PERRL, EOMI, normal lids, iris ENT:  grossly normal hearing, lips & tongue, mmm Neck:  no LAD, masses or thyromegaly Cardiovascular:  RRR, no m/r/g. No LE edema.  Respiratory:  Normal effort, decreased breath sounds in the bases bilaterally, on 2 L nasal cannula. Abdomen:  soft, ntnd, NABS Skin:  no rash or induration seen on limited exam Musculoskeletal:  2+ distal pulses in the lower chest bilaterally with trace edema. No significant bony abnormalities appreciated. Minimal movement of the right hip secondary to pain. Psychiatric:  grossly normal mood and affect, speech fluent and appropriate, AOx3 Neurologic:  CN 2-12 grossly intact, moves all extremities in coordinated fashion, sensation intact  Data reviewed:  I have  personally reviewed following labs and imaging studies Labs:  CBC:  Recent Labs Lab 03/01/16 0405  WBC 10.0  HGB 9.2*  HCT 28.7*  MCV 96.3  PLT 123XX123    Basic Metabolic Panel:  Recent Labs Lab 03/01/16 0405  NA 140  K 3.2*  CL 104  CO2 30  GLUCOSE 108*  BUN 12  CREATININE 0.82  CALCIUM 8.1*   GFR Estimated Creatinine Clearance: 65.8 mL/min (by C-G formula based on SCr of 0.82 mg/dL). Liver Function Tests: No results for input(s): AST, ALT, ALKPHOS, BILITOT, PROT, ALBUMIN in the last 168 hours. No results for input(s): LIPASE, AMYLASE in the last 168 hours. No results for input(s): AMMONIA in the last 168 hours. Coagulation profile No results for input(s): INR, PROTIME in the last  168 hours.  Cardiac Enzymes: No results for input(s): CKTOTAL, CKMB, CKMBINDEX, TROPONINI in the last 168 hours. BNP: Invalid input(s): POCBNP CBG:  Recent Labs Lab 02/29/16 1517  GLUCAP 133*   D-Dimer No results for input(s): DDIMER in the last 72 hours. Hgb A1c No results for input(s): HGBA1C in the last 72 hours. Lipid Profile No results for input(s): CHOL, HDL, LDLCALC, TRIG, CHOLHDL, LDLDIRECT in the last 72 hours. Thyroid function studies No results for input(s): TSH, T4TOTAL, T3FREE, THYROIDAB in the last 72 hours.  Invalid input(s): FREET3 Anemia work up No results for input(s): VITAMINB12, FOLATE, FERRITIN, TIBC, IRON, RETICCTPCT in the last 72 hours. Urinalysis    Component Value Date/Time   COLORURINE YELLOW 12/01/2014 2012   APPEARANCEUR CLOUDY (A) 12/01/2014 2012   LABSPEC 1.017 12/01/2014 2012   PHURINE 5.0 12/01/2014 2012   GLUCOSEU NEGATIVE 12/01/2014 2012   HGBUR NEGATIVE 12/01/2014 2012   BILIRUBINUR NEGATIVE 12/01/2014 2012   KETONESUR NEGATIVE 12/01/2014 2012   PROTEINUR NEGATIVE 12/01/2014 2012   UROBILINOGEN 0.2 12/01/2014 2012   NITRITE NEGATIVE 12/01/2014 2012   LEUKOCYTESUR SMALL (A) 12/01/2014 2012     Microbiology Recent Results (from the  past 240 hour(s))  Surgical pcr screen     Status: None   Collection Time: 02/22/16 10:51 AM  Result Value Ref Range Status   MRSA, PCR NEGATIVE NEGATIVE Final   Staphylococcus aureus NEGATIVE NEGATIVE Final    Comment:        The Xpert SA Assay (FDA approved for NASAL specimens in patients over 43 years of age), is one component of a comprehensive surveillance program.  Test performance has been validated by [facility] for patients greater than or equal to 37 year old. It is not intended to diagnose infection nor to guide or monitor treatment.        Inpatient Medications:   Scheduled Meds: . amLODipine  10 mg Oral Daily  . aspirin  81 mg Oral BID  . docusate sodium  100 mg Oral BID  . lisinopril  20 mg Oral BID   And  . hydrochlorothiazide  12.5 mg Oral BID  . levothyroxine  100 mcg Oral [id number]  . potassium chloride  40 mEq Oral Once  . senna  2 tablet Oral QHS   Continuous Infusions: . sodium chloride 150 mL/hr at 02/29/16 1704     Radiological Exams on Admission: Dg Pelvis Portable  Result Date: 02/29/2016 CLINICAL DATA:  Postop right hip surgery. EXAM: PORTABLE PELVIS 1-2 VIEWS COMPARISON:  02/29/2016 at 1415 hours FINDINGS: Single view of the pelvis demonstrates a right hip arthroplasty. No evidence for a periprosthetic fracture. Right hip appears to be located on this single view. Soft tissue air compatible with recent surgery. Pelvic bony ring is intact. Scattered phleboliths in the pelvis. IMPRESSION: Right hip arthroplasty without complicating features. Electronically Signed   By: Markus Daft M.D.   On: 02/29/2016 16:03   Dg Chest Port 1 View  Result Date: 03/01/2016 CLINICAL DATA:  Hypoxemia. EXAM: PORTABLE CHEST 1 VIEW COMPARISON:  12/11/2014 FINDINGS: Heart size is normal. There is aortic atherosclerosis. There is poor inspiration with hypoaerated changes at the bases that could relate to poor inspiration or represent true atelectasis. Upper lobes are  clear. No effusions. Pulmonary vascularity is normal. No acute bone finding. IMPRESSION: Poor inspiration versus mild basilar atelectasis. Electronically Signed   By: Nelson Chimes M.D.   On: 03/01/2016 11:56   Dg C-arm 61-120 Min  Result Date: 02/29/2016 CLINICAL  DATA:  Right hip replacement EXAM: OPERATIVE RIGHT HIP WITH PELVIS; DG C-ARM 61-120 MIN COMPARISON:  08/04/2015 FLUOROSCOPY TIME:  Fluoroscopy Time:  Not available Radiation Exposure Index (if provided by the fluoroscopic device): 17 seconds Number of Acquired Spot Images: 2 FINDINGS: Right hip replacement is noted. No acute bony abnormality is seen. No soft tissue changes are noted. IMPRESSION: Right hip replacement Electronically Signed   By: Inez Catalina M.D.   On: 02/29/2016 14:47   Dg Hip Operative Unilat With Pelvis Right  Result Date: 02/29/2016 CLINICAL DATA:  Right hip replacement EXAM: OPERATIVE RIGHT HIP WITH PELVIS; DG C-ARM 61-120 MIN COMPARISON:  08/04/2015 FLUOROSCOPY TIME:  Fluoroscopy Time:  Not available Radiation Exposure Index (if provided by the fluoroscopic device): 17 seconds Number of Acquired Spot Images: 2 FINDINGS: Right hip replacement is noted. No acute bony abnormality is seen. No soft tissue changes are noted. IMPRESSION: Right hip replacement Electronically Signed   By: Inez Catalina M.D.   On: 02/29/2016 14:47    Impression/Recommendations Principal Problem:   Primary osteoarthritis of right hip Active Problems:   History of total right hip arthroplasty   Hypoxemia   Symptomatic anemia   Hypokalemia  Total right hip arthroplasty: POD #1. Performed by Dr.Swinteck on 03/01/16.  - Management per primary team  Hypoxemia: O2 saturations below 90 this am when off O2. No H/o chronic pulmonary pathology. Suspect fluid overload from aggressive hydration during and after surgery w/ possible CHF and anemia. - O2 prn - CXR, consider Echo if shows cardiomegaly - BNP, Trop - CTA to r/o CAP/PE - per Dr  Lyla Glassing  Symptomatic anemia: Hgb 9.2. 13.1 pre-op. Suspect GI source w/ hemodilution as pt on NS 139ml/hr since surgery.  - repeat CBC - transfuse if below 8 and symptomatic.   HypoKalemia: 3.2. - Mag level - Kdur    Thank you for this consultation.  Our [facility] hospitalist team will follow the patient with you.     MERRELL, DAVID J M.D. [facility] 03/01/2016, 1:50 PM

## 2016-03-01 NOTE — Progress Notes (Signed)
Called by RN regarding hypoxia. Patient satting low 80s on 4L [place]. I ordered a STAT chest CT to rule out PE. I spoke with hospitalist who will evaluated the patient.

## 2016-03-01 NOTE — Discharge Summary (Signed)
Physician Discharge Summary  Patient ID: Karen [name] MRN: [id number] DOB/AGE: 1946/04/18 70 y.o.  Admit date: 02/29/2016 Discharge date: 03/02/2016  Admission Diagnoses:  Primary osteoarthritis of right hip  Discharge Diagnoses:  Principal Problem:   Primary osteoarthritis of right hip Active Problems:   History of total right hip arthroplasty   Hypoxemia   Symptomatic anemia   Hypokalemia   Past Medical History:  Diagnosis Date  . Arthritis    right hip  . Breast cancer [facility]) 2004   bilateral mastectomy-all left lymph nodes removed; chemo. Patient states she had "spot" on the right breast as well.  . Diverticulitis   . GERD (gastroesophageal reflux disease)    since gallbladder removed, no more GERD  . Goiter   . HTN (hypertension)   . Hypothyroidism   . Pneumonia   . Sleep apnea    has never been tested.  was dx with cancer first and had this problem on the back burner  . Tubular adenoma     Surgeries: Procedure(s): TOTAL HIP ARTHROPLASTY ANTERIOR APPROACH on 02/29/2016   Consultants (if any):   Discharged Condition: Improved  Hospital Course: Karen [name] is an 70 y.o. female who was admitted 02/29/2016 with a diagnosis of Primary osteoarthritis of right hip and went to the operating room on 02/29/2016 and underwent the above named procedures.    She was given perioperative antibiotics:  Anti-infectives    Start     Dose/Rate Route Frequency Ordered Stop   02/29/16 1800  ceFAZolin (ANCEF) IVPB 2g/100 mL premix     2 g 200 mL/hr over 30 Minutes Intravenous Every 6 hours 02/29/16 1647 03/01/16 0148   02/29/16 1005  ceFAZolin (ANCEF) 2-4 GM/100ML-% IVPB    Comments:  Forte, Lindsi   : cabinet override      02/29/16 1005 02/29/16 1255   02/29/16 0959  ceFAZolin (ANCEF) IVPB 2g/100 mL premix     2 g 200 mL/hr over 30 Minutes Intravenous On call to O.R. 02/29/16 ID:[id number] 02/29/16 1255    .  She was given sequential compression devices, early ambulation, and ASA  for DVT prophylaxis. She developed hypoxia. Hospitalist consult was obtained.  Chest CT was negative for PE. CXR showed atelectasis. She required O2 to sat above 90% and was set up for home O2.  She benefited maximally from the hospital stay and there were no complications.    Recent vital signs:  Vitals:   03/02/16 0643 03/02/16 0835  BP: (!) 105/50 (!) 109/52  Pulse: 75 76  Resp: 16   Temp: 97.6 F (36.4 C)     Recent laboratory studies:  Lab Results  Component Value Date   HGB 9.3 (L) 03/02/2016   HGB 9.8 (L) 03/01/2016   HGB 9.2 (L) 03/01/2016   Lab Results  Component Value Date   WBC 14.0 (H) 03/02/2016   PLT 187 03/02/2016   No results found for: INR Lab Results  Component Value Date   NA 139 03/02/2016   K 3.9 03/02/2016   CL 104 03/02/2016   CO2 29 03/02/2016   BUN 11 03/02/2016   CREATININE 0.71 03/02/2016   GLUCOSE 154 (H) 03/02/2016    Discharge Medications:     Medication List    STOP taking these medications   aspirin EC 81 MG tablet Replaced by:  aspirin 81 MG chewable tablet   ceFAZolin 2-3 GM-% Solr Commonly known as:  ANCEF   HYDROcodone-acetaminophen 10-325 MG tablet Commonly known as:  NORCO Replaced  by:  HYDROcodone-acetaminophen 5-325 MG tablet   methocarbamol 500 MG tablet Commonly known as:  ROBAXIN   OXYCONTIN 15 mg 12 hr tablet Generic drug:  oxyCODONE     TAKE these medications   ALPRAZolam 1 MG tablet Commonly known as:  XANAX Take 1 tablet (1 mg total) by mouth 3 (three) times daily as needed for anxiety.   amLODipine 10 MG tablet Commonly known as:  NORVASC Take 10 mg by mouth daily.   aspirin 81 MG chewable tablet Chew 1 tablet (81 mg total) by mouth 2 (two) times daily. Replaces:  aspirin EC 81 MG tablet   docusate sodium 100 MG capsule Commonly known as:  COLACE Take 1 capsule (100 mg total) by mouth 3 (three) times daily as needed for mild constipation.   HYDROcodone-acetaminophen 5-325 MG tablet Commonly  known as:  NORCO/VICODIN Take 1-2 tablets by mouth every 4 (four) hours as needed (breakthrough pain). Replaces:  HYDROcodone-acetaminophen 10-325 MG tablet   levothyroxine 100 MCG tablet Commonly known as:  SYNTHROID, LEVOTHROID Take 100 mcg by mouth every morning.   lisinopril-hydrochlorothiazide 20-12.5 MG tablet Commonly known as:  PRINZIDE,ZESTORETIC Take 1 tablet by mouth 2 (two) times daily.   ondansetron 4 MG tablet Commonly known as:  ZOFRAN Take 1 tablet (4 mg total) by mouth every 8 (eight) hours as needed for nausea or vomiting.       Diagnostic Studies: Ct Angio Chest Pe W Or Wo Contrast  Result Date: 03/01/2016 CLINICAL DATA:  Hypoxia EXAM: CT ANGIOGRAPHY CHEST WITH CONTRAST TECHNIQUE: Multidetector CT imaging of the chest was performed using the standard protocol during bolus administration of intravenous contrast. Multiplanar CT image reconstructions and MIPs were obtained to evaluate the vascular anatomy. CONTRAST:  100 cc Isovue 370 IV COMPARISON:  Chest x-ray earlier today. FINDINGS: Cardiovascular: No filling defects in the pulmonary arteries to suggest pulmonary emboli. Heart is normal size. Aorta is normal caliber. No dissection. Mediastinum/Nodes: No mediastinal, hilar, or axillary adenopathy. Lungs/Pleura: Linear areas of atelectasis in the lung bases. No pleural effusions. Upper Abdomen: Imaging into the upper abdomen shows no acute findings. Musculoskeletal: Surgical clips in the left axilla. Bilateral breast implants noted. Chest wall soft tissues unremarkable. No acute bony abnormality or focal bone lesion. Review of the MIP images confirms the above findings. IMPRESSION: No evidence of pulmonary embolus. Mild atherosclerotic disease in the aorta. No aneurysm. Bibasilar atelectasis. Electronically Signed   By: Rolm Baptise M.D.   On: 03/01/2016 14:03   Dg Pelvis Portable  Result Date: 02/29/2016 CLINICAL DATA:  Postop right hip surgery. EXAM: PORTABLE PELVIS 1-2  VIEWS COMPARISON:  02/29/2016 at 1415 hours FINDINGS: Single view of the pelvis demonstrates a right hip arthroplasty. No evidence for a periprosthetic fracture. Right hip appears to be located on this single view. Soft tissue air compatible with recent surgery. Pelvic bony ring is intact. Scattered phleboliths in the pelvis. IMPRESSION: Right hip arthroplasty without complicating features. Electronically Signed   By: Markus Daft M.D.   On: 02/29/2016 16:03   Dg Chest Port 1 View  Result Date: 03/01/2016 CLINICAL DATA:  Hypoxemia. EXAM: PORTABLE CHEST 1 VIEW COMPARISON:  12/11/2014 FINDINGS: Heart size is normal. There is aortic atherosclerosis. There is poor inspiration with hypoaerated changes at the bases that could relate to poor inspiration or represent true atelectasis. Upper lobes are clear. No effusions. Pulmonary vascularity is normal. No acute bone finding. IMPRESSION: Poor inspiration versus mild basilar atelectasis. Electronically Signed   By: Jan Fireman.D.  On: 03/01/2016 11:56   Dg C-arm 61-120 Min  Result Date: 02/29/2016 CLINICAL DATA:  Right hip replacement EXAM: OPERATIVE RIGHT HIP WITH PELVIS; DG C-ARM 61-120 MIN COMPARISON:  08/04/2015 FLUOROSCOPY TIME:  Fluoroscopy Time:  Not available Radiation Exposure Index (if provided by the fluoroscopic device): 17 seconds Number of Acquired Spot Images: 2 FINDINGS: Right hip replacement is noted. No acute bony abnormality is seen. No soft tissue changes are noted. IMPRESSION: Right hip replacement Electronically Signed   By: Inez Catalina M.D.   On: 02/29/2016 14:47   Dg Hip Operative Unilat With Pelvis Right  Result Date: 02/29/2016 CLINICAL DATA:  Right hip replacement EXAM: OPERATIVE RIGHT HIP WITH PELVIS; DG C-ARM 61-120 MIN COMPARISON:  08/04/2015 FLUOROSCOPY TIME:  Fluoroscopy Time:  Not available Radiation Exposure Index (if provided by the fluoroscopic device): 17 seconds Number of Acquired Spot Images: 2 FINDINGS: Right hip  replacement is noted. No acute bony abnormality is seen. No soft tissue changes are noted. IMPRESSION: Right hip replacement Electronically Signed   By: Inez Catalina M.D.   On: 02/29/2016 14:47    Disposition: 01-Home or Self Care  Discharge Instructions    Call MD / Call 911    Complete by:  As directed    If you experience chest pain or shortness of breath, CALL 911 and be transported to the hospital emergency room.  If you develope a fever above 101 F, pus (white drainage) or increased drainage or redness at the wound, or calf pain, call your surgeon's office.   Constipation Prevention    Complete by:  As directed    Drink plenty of fluids.  Prune juice may be helpful.  You may use a stool softener, such as Colace (over the counter) 100 mg twice a day.  Use MiraLax (over the counter) for constipation as needed.   Diet - low sodium heart healthy    Complete by:  As directed    Driving restrictions    Complete by:  As directed    No driving for 6 weeks   Increase activity slowly as tolerated    Complete by:  As directed    Lifting restrictions    Complete by:  As directed    No lifting for 6 weeks   TED hose    Complete by:  As directed    Use stockings (TED hose) for 2 weeks on both leg(s).  You may remove them at night for sleeping.      Follow-up Information    Taaliyah Delpriore, Horald Pollen, MD. Schedule an appointment as soon as possible for a visit in 2 week(s).   Specialty:  Orthopedic Surgery Why:  For wound re-check Contact information: [address] [place] [address] [phone]        [facility] .   Why:  Someone from [facility] [employer]), will contact you to arrange start date and time for therapy. Contact information: [address] [place]  [address] [phone]        Wende Neighbors, MD. Schedule an appointment as soon as possible for a visit in 1 week(s).   Specialty:  Internal Medicine Contact  information: [place] [address] [phone]        Wende Neighbors, MD .   Specialty:  Internal Medicine Contact information: [place] [address] [phone]            Signed: Elie Goody 03/02/2016, 12:55 PM

## 2016-03-01 NOTE — Care Management Note (Signed)
Case Management Note  Patient Details  Name: Karen [name] MRN: [id number] Date of Birth: Jan 30, 1946  Subjective/Objective:  70 yr old female s/p right total hip arthroplasty.               Action/Plan:  Case manager spoke with patient's son concerning his mom's discharge plan. Patient currently off unit having CT to r/o PE. Oxygen sats in 80's on 4L [place]. Patient was preoperatively setup with [facility], no changes. Has rolling walker, 3in1 and a cane. Son states her bathroom is handicap accessible.  Patient will have family support at discharge.     Expected Discharge Date:    03/02/16              Expected Discharge Plan:  [place]  In-House Referral:     Discharge planning Services  CM Consult  Post Acute Care Choice:  Home Health Choice offered to:  Adult Children  DME Arranged:  N/A DME Agency:     HH Arranged:  PT HH Agency:  [facility] (now [facility])  Status of Service:  Completed, signed off  If discussed at [place] of Stay Meetings, dates discussed:    Additional Comments:  Ninfa Meeker, RN 03/01/2016, 1:47 PM

## 2016-03-01 NOTE — Progress Notes (Signed)
Physical Therapy Treatment Patient Details Name: Karen [name] MRN: [id number] DOB: 1946/01/08 Today's Date: 03/01/2016    History of Present Illness Karen [name] is an 70 y.o. female breast cancer status post bilateral mastectomy, diverticulosis, GERD, thyroid dysfunction, HTN, OSA presenting to [facility] for elective total hip arthroplasty from an anterior approach. Performed on 02/29/2016    PT Comments    Patient progressing with strength and movement of R leg during ambulation.  Reports not feeling well and did not dropped SpO2 to 86% ambulating on RA, 0.5LPM O2 reapplied at rest with sats up to 91%.  Will follow up for continued gait and exercises prior to d/c.   Follow Up Recommendations  Home health PT     Equipment Recommendations  None recommended by PT    Recommendations for Other Services       Precautions / Restrictions Precautions Precautions: Fall Restrictions Weight Bearing Restrictions: Yes RLE Weight Bearing: Weight bearing as tolerated    Mobility  Bed Mobility               General bed mobility comments: up in recliner  Transfers                    Ambulation/Gait Ambulation/Gait assistance: Min assist Ambulation Distance (Feet): 130 Feet Assistive device: Rolling walker (2 wheeled) Gait Pattern/deviations: Step-to pattern;Step-through pattern;Shuffle;Decreased stride length;Trunk flexed     General Gait Details: initially more antalgic, but improved with increased distance, cues for sequence, and proximity to walker   Stairs            Wheelchair Mobility    Modified Rankin (Stroke Patients Only)       Balance Overall balance assessment: Needs assistance         Standing balance support: Bilateral upper extremity supported Standing balance-Leahy Scale: Poor Standing balance comment: UE support for balance                    Cognition Arousal/Alertness: Awake/alert Behavior During Therapy:  WFL for tasks assessed/performed Overall Cognitive Status: Within Functional Limits for tasks assessed                      Exercises      General Comments        Pertinent Vitals/Pain Pain Assessment: Faces Faces Pain Scale: Hurts even more Pain Location: R hip Pain Descriptors / Indicators: Aching;Grimacing Pain Intervention(s): Monitored during session;Repositioned    Home Living Family/patient expects to be discharged to:: Private residence Living Arrangements: Spouse/significant other;Children Available Help at Discharge: Family;Available 24 hours/day Type of Home: House Home Access: Level entry   Home Layout: Two level;Able to live on main level with bedroom/bathroom Home Equipment: Bedside commode;Grab bars - tub/shower;Grab bars - toilet;Hand held shower head;Walker - 2 wheels;Cane - single point;Tub bench      Prior Function Level of Independence: Independent with assistive device(s)      Comments: used RW when cooking   PT Goals (current goals can now be found in the care plan section) Acute Rehab PT Goals Patient Stated Goal: To return home PT Goal Formulation: With patient/family Time For Goal Achievement: 03/05/16 Potential to Achieve Goals: Good Progress towards PT goals: Progressing toward goals    Frequency    7X/week      PT Plan Current plan remains appropriate    Co-evaluation             End of Session Equipment Utilized  During Treatment: Gait belt Activity Tolerance: Patient tolerated treatment well Patient left: with call bell/phone within reach;in chair;with family/visitor present     Time: 1550-1610 PT Time Calculation (min) (ACUTE ONLY): 20 min  Charges:  $Gait Training: 8-22 mins                    G Codes:      [name] 03-13-2016, 5:27 PM  [name], [place] 2016/03/13

## 2016-03-02 DIAGNOSIS — R0902 Hypoxemia: Secondary | ICD-10-CM

## 2016-03-02 LAB — BASIC METABOLIC PANEL
Anion gap: 6 (ref 5–15)
BUN: 11 mg/dL (ref 6–20)
CHLORIDE: 104 mmol/L (ref 101–111)
CO2: 29 mmol/L (ref 22–32)
CREATININE: 0.71 mg/dL (ref 0.44–1.00)
Calcium: 8.3 mg/dL — ABNORMAL LOW (ref 8.9–10.3)
Glucose, Bld: 154 mg/dL — ABNORMAL HIGH (ref 65–99)
POTASSIUM: 3.9 mmol/L (ref 3.5–5.1)
SODIUM: 139 mmol/L (ref 135–145)

## 2016-03-02 LAB — CBC
HCT: 29.1 % — ABNORMAL LOW (ref 36.0–46.0)
HEMOGLOBIN: 9.3 g/dL — AB (ref 12.0–15.0)
MCH: 31 pg (ref 26.0–34.0)
MCHC: 32 g/dL (ref 30.0–36.0)
MCV: 97 fL (ref 78.0–100.0)
PLATELETS: 187 10*3/uL (ref 150–400)
RBC: 3 MIL/uL — AB (ref 3.87–5.11)
RDW: 14.1 % (ref 11.5–15.5)
WBC: 14 10*3/uL — ABNORMAL HIGH (ref 4.0–10.5)

## 2016-03-02 NOTE — Progress Notes (Signed)
[facility] PROGRESS NOTE  Karen [name] [id number] DOB: 07-20-1945 DOA: 02/29/2016  PCP: Wende Neighbors, MD  Brief History/Interval Summary: 70 year old Caucasian female with a past medical history of breast cancer status post bilateral mastectomy, diverticulosis, GERD, thyroid dysfunction, hypertension, was admitted to [facility] for total hip arthroplasty for severe arthritis. Postoperatively, patient was noted to be hypoxic. Hospitalist was consulted for further management.  Reason for Visit: Hypoxia  Procedures: Right total hip arthroplasty  Antibiotics: None  Subjective/Interval History: Patient feels well this morning. She denies any chest pain or shortness of breath. No nausea or vomiting. No cough. She tells me that she has never been diagnosed with sleep apnea but has been told that he snores a lot at night. She's never had a sleep study.  Objective:  Vital Signs  Vitals:   03/01/16 1716 03/01/16 2113 03/02/16 0643 03/02/16 0835  BP:  (!) 108/53 (!) 105/50 (!) 109/52  Pulse:  76 75 76  Resp:  15 16   Temp: 98.3 F (36.8 C) 98.5 F (36.9 C) 97.6 F (36.4 C)   TempSrc: Oral Oral Oral   SpO2:  91% 96%   Weight:      Height:        Intake/Output Summary (Last 24 hours) at 03/02/16 1406 Last data filed at 03/02/16 0900  Gross per 24 hour  Intake              720 ml  Output                0 ml  Net              720 ml   Filed Weights   02/29/16 0958  Weight: 88 kg (194 lb)    General appearance: alert, cooperative, appears stated age and no distress Resp: Diminished air entry at the bases. No definite crackles, wheezing or rhonchi. Cardio: regular rate and rhythm, S1, S2 normal, no murmur, click, rub or gallop GI: soft, non-tender; bowel sounds normal; no masses,  no organomegaly Extremities: extremities normal, atraumatic, no cyanosis or edema Neurologic: Awake and alert. Oriented 3. No focal neurological deficits.  Lab Results:  Data  Reviewed: I have personally reviewed following labs and imaging studies  CBC:  Recent Labs Lab 03/01/16 0405 03/01/16 1737 03/02/16 0411  WBC 10.0 12.5* 14.0*  HGB 9.2* 9.8* 9.3*  HCT 28.7* 31.2* 29.1*  MCV 96.3 97.5 97.0  PLT 177 187 [id number]    Basic Metabolic Panel:  Recent Labs Lab 03/01/16 0405 03/01/16 1737 03/02/16 0411  NA 140  --  139  K 3.2*  --  3.9  CL 104  --  104  CO2 30  --  29  GLUCOSE 108*  --  154*  BUN 12  --  11  CREATININE 0.82  --  0.71  CALCIUM 8.1*  --  8.3*  MG  --  1.6*  --     GFR: Estimated Creatinine Clearance: 67.5 mL/min (by C-G formula based on SCr of 0.71 mg/dL).  Cardiac Enzymes:  Recent Labs Lab 03/01/16 1737  TROPONINI <0.03    CBG:  Recent Labs Lab 02/29/16 1517  GLUCAP 133*     Recent Results (from the past 240 hour(s))  Surgical pcr screen     Status: None   Collection Time: 02/22/16 10:51 AM  Result Value Ref Range Status   MRSA, PCR NEGATIVE NEGATIVE Final   Staphylococcus aureus NEGATIVE NEGATIVE Final    Comment:  The Xpert SA Assay (FDA approved for NASAL specimens in patients over 56 years of age), is one component of a comprehensive surveillance program.  Test performance has been validated by [facility] for patients greater than or equal to 13 year old. It is not intended to diagnose infection nor to guide or monitor treatment.       Radiology Studies: Ct Angio Chest Pe W Or Wo Contrast  Result Date: 03/01/2016 CLINICAL DATA:  Hypoxia EXAM: CT ANGIOGRAPHY CHEST WITH CONTRAST TECHNIQUE: Multidetector CT imaging of the chest was performed using the standard protocol during bolus administration of intravenous contrast. Multiplanar CT image reconstructions and MIPs were obtained to evaluate the vascular anatomy. CONTRAST:  100 cc Isovue 370 IV COMPARISON:  Chest x-ray earlier today. FINDINGS: Cardiovascular: No filling defects in the pulmonary arteries to suggest pulmonary emboli. Heart is  normal size. Aorta is normal caliber. No dissection. Mediastinum/Nodes: No mediastinal, hilar, or axillary adenopathy. Lungs/Pleura: Linear areas of atelectasis in the lung bases. No pleural effusions. Upper Abdomen: Imaging into the upper abdomen shows no acute findings. Musculoskeletal: Surgical clips in the left axilla. Bilateral breast implants noted. Chest wall soft tissues unremarkable. No acute bony abnormality or focal bone lesion. Review of the MIP images confirms the above findings. IMPRESSION: No evidence of pulmonary embolus. Mild atherosclerotic disease in the aorta. No aneurysm. Bibasilar atelectasis. Electronically Signed   By: Rolm Baptise M.D.   On: 03/01/2016 14:03   Dg Pelvis Portable  Result Date: 02/29/2016 CLINICAL DATA:  Postop right hip surgery. EXAM: PORTABLE PELVIS 1-2 VIEWS COMPARISON:  02/29/2016 at 1415 hours FINDINGS: Single view of the pelvis demonstrates a right hip arthroplasty. No evidence for a periprosthetic fracture. Right hip appears to be located on this single view. Soft tissue air compatible with recent surgery. Pelvic bony ring is intact. Scattered phleboliths in the pelvis. IMPRESSION: Right hip arthroplasty without complicating features. Electronically Signed   By: Markus Daft M.D.   On: 02/29/2016 16:03   Dg Chest Port 1 View  Result Date: 03/01/2016 CLINICAL DATA:  Hypoxemia. EXAM: PORTABLE CHEST 1 VIEW COMPARISON:  12/11/2014 FINDINGS: Heart size is normal. There is aortic atherosclerosis. There is poor inspiration with hypoaerated changes at the bases that could relate to poor inspiration or represent true atelectasis. Upper lobes are clear. No effusions. Pulmonary vascularity is normal. No acute bone finding. IMPRESSION: Poor inspiration versus mild basilar atelectasis. Electronically Signed   By: Nelson Chimes M.D.   On: 03/01/2016 11:56   Dg C-arm 61-120 Min  Result Date: 02/29/2016 CLINICAL DATA:  Right hip replacement EXAM: OPERATIVE RIGHT HIP WITH  PELVIS; DG C-ARM 61-120 MIN COMPARISON:  08/04/2015 FLUOROSCOPY TIME:  Fluoroscopy Time:  Not available Radiation Exposure Index (if provided by the fluoroscopic device): 17 seconds Number of Acquired Spot Images: 2 FINDINGS: Right hip replacement is noted. No acute bony abnormality is seen. No soft tissue changes are noted. IMPRESSION: Right hip replacement Electronically Signed   By: Inez Catalina M.D.   On: 02/29/2016 14:47   Dg Hip Operative Unilat With Pelvis Right  Result Date: 02/29/2016 CLINICAL DATA:  Right hip replacement EXAM: OPERATIVE RIGHT HIP WITH PELVIS; DG C-ARM 61-120 MIN COMPARISON:  08/04/2015 FLUOROSCOPY TIME:  Fluoroscopy Time:  Not available Radiation Exposure Index (if provided by the fluoroscopic device): 17 seconds Number of Acquired Spot Images: 2 FINDINGS: Right hip replacement is noted. No acute bony abnormality is seen. No soft tissue changes are noted. IMPRESSION: Right hip replacement Electronically Signed  By: Inez Catalina M.D.   On: 02/29/2016 14:47     Medications:  Scheduled: . amLODipine  10 mg Oral Daily  . aspirin  81 mg Oral BID  . docusate sodium  100 mg Oral BID  . lisinopril  20 mg Oral BID   And  . hydrochlorothiazide  12.5 mg Oral BID  . levothyroxine  100 mcg Oral BH-q7a  . senna  2 tablet Oral QHS   Continuous:  [id number] **OR** acetaminophen, ALPRAZolam, diphenhydrAMINE, HYDROcodone-acetaminophen, HYDROmorphone (DILAUDID) injection, menthol-cetylpyridinium **OR** phenol, methocarbamol **OR** methocarbamol (ROBAXIN)  IV, metoCLOPramide **OR** metoCLOPramide (REGLAN) injection, ondansetron **OR** ondansetron (ZOFRAN) IV, polyethylene glycol  Assessment/Plan:  Principal Problem:   Primary osteoarthritis of right hip Active Problems:   History of total right hip arthroplasty   Hypoxemia   Symptomatic anemia   Hypokalemia    Hypoxia Etiology remains unclear. Chest x-ray showed only atelectasis. CT angiogram of the chest did not  show any pulmonary embolism. EKG did not show any ischemic changes. It is quite possible the patient does have sleep apnea, which has not been diagnosed with a sleep study. She is sitting on the chair this morning. Saturating between 90-95%. Denies any complaints whatsoever. Her heart size is noted to be normal on chest x-ray. Yesterday's episode could also have been related to medications. I have told her that she will benefit from a sleep study to diagnose her sleep apnea. Meantime, we can do an ambulatory pulse oximetry and if low, she will benefit from going home with oxygen. Further evaluation for this can be pursued as an outpatient. No recent echocardiogram is available in our system and one can be pursued as an outpatient. There is no urgent need to do it in the hospital.  Otherwise, patient is stable. Defer other issues to orthopedic.  From medicine perspective, she is okay for discharge today. She may need to be discharged home on home oxygen.  Thank you for this consult and the opportunity to participate in this patient's care. Please contact me if there are any questions.   LOS: 2 days   [facility] Pager [phone] 03/02/2016, 2:06 PM  If 7PM-7AM, please contact night-coverage at [email or web address], password [facility]

## 2016-03-02 NOTE — Progress Notes (Signed)
Pt ready for discharge. Education/instructions reviewed with pt and family member and all questions/concerns addressed. IV removed and belongings gathered. Pt will be transported out via wheelchair to family member's vehicle. Will continue to monitor

## 2016-03-02 NOTE — Progress Notes (Signed)
Physical Therapy Treatment Patient Details Name: Karen [name] MRN: [id number] DOB: 1946/02/26 Today's Date: 03/02/2016    History of Present Illness Karen [name] is an 70 y.o. female breast cancer status post bilateral mastectomy, diverticulosis, GERD, thyroid dysfunction, HTN, OSA presenting to [facility] for elective total hip arthroplasty from an anterior approach. Performed on 02/29/2016    PT Comments    Pt presented sitting OOB in recliner when PT entered room. Pt's daughter was present throughout session as well. Pt making good progress towards achieving her functional goals and increasing distance ambulated during this session. Pt would continue to benefit from skilled physical therapy services at this time while admitted and after d/c to address her limitations in order to improve her overall safety and independence with functional mobility.   Follow Up Recommendations  Home health PT;Supervision - Intermittent     Equipment Recommendations  None recommended by PT    Recommendations for Other Services       Precautions / Restrictions Precautions Precautions: Fall Restrictions Weight Bearing Restrictions: Yes RLE Weight Bearing: Weight bearing as tolerated    Mobility  Bed Mobility               General bed mobility comments: pt sitting OOB in recliner when PT entered room  Transfers Overall transfer level: Needs assistance Equipment used: Rolling walker (2 wheeled) Transfers: Sit to/from Stand Sit to Stand: Min guard         General transfer comment: pt required increased time and VC'ing for bilateral hand positioning  Ambulation/Gait Ambulation/Gait assistance: Min guard Ambulation Distance (Feet): 150 Feet Assistive device: Rolling walker (2 wheeled) Gait Pattern/deviations: Step-to pattern;Decreased step length - left;Decreased stance time - right;Decreased weight shift to right Gait velocity: decreased Gait velocity interpretation:  Below normal speed for age/gender     Stairs            Wheelchair Mobility    Modified Rankin (Stroke Patients Only)       Balance Overall balance assessment: Needs assistance Sitting-balance support: Feet supported;No upper extremity supported Sitting balance-Leahy Scale: Fair     Standing balance support: During functional activity;Bilateral upper extremity supported Standing balance-Leahy Scale: Poor                      Cognition Arousal/Alertness: Awake/alert Behavior During Therapy: WFL for tasks assessed/performed Overall Cognitive Status: Within Functional Limits for tasks assessed                      Exercises Total Joint Exercises Ankle Circles/Pumps: AROM;Both;10 reps;Seated Quad Sets: AROM;Strengthening;Right;10 reps;Seated Hip ABduction/ADduction: AROM;Strengthening;Right;10 reps;Seated    General Comments        Pertinent Vitals/Pain Pain Assessment: 0-10 Pain Score: 6  Pain Location: R hip Pain Descriptors / Indicators: Guarding;Grimacing Pain Intervention(s): Monitored during session;Repositioned;Ice applied    Home Living                      Prior Function            PT Goals (current goals can now be found in the care plan section) Acute Rehab PT Goals Patient Stated Goal: To return home PT Goal Formulation: With patient/family Time For Goal Achievement: 03/05/16 Potential to Achieve Goals: Good Progress towards PT goals: Progressing toward goals    Frequency    7X/week      PT Plan Current plan remains appropriate    Co-evaluation  End of Session Equipment Utilized During Treatment: Gait belt Activity Tolerance: Patient limited by pain Patient left: in chair;with call bell/phone within reach;with family/visitor present     Time: [id number] PT Time Calculation (min) (ACUTE ONLY): 18 min  Charges:  $Gait Training: 8-22 mins                    G CodesClearnce Sorrel  Maaz Spiering Mar 22, 2016, 9:28 AM Sherie Don, PT, DPT [phone]

## 2016-03-02 NOTE — Discharge Instructions (Signed)
Please talk to your PCP regarding a sleep study and Echocardiogram. Continue wearing Oxygen for now, especially with activity.     Dr. Rod Can Joint Replacement Specialist [facility] [address], [place], [place] [address] [phone]   TOTAL HIP REPLACEMENT POSTOPERATIVE DIRECTIONS    Hip Rehabilitation, Guidelines Following Surgery   WEIGHT BEARING Weight bearing as tolerated with assist device (walker, cane, etc) as directed, use it as long as suggested by your surgeon or therapist, typically at least 4-6 weeks.  The results of a hip operation are greatly improved after range of motion and muscle strengthening exercises. Follow all safety measures which are given to protect your hip. If any of these exercises cause increased pain or swelling in your joint, decrease the amount until you are comfortable again. Then slowly increase the exercises. Call your caregiver if you have problems or questions.   HOME CARE INSTRUCTIONS  Most of the following instructions are designed to prevent the dislocation of your new hip.  Remove items at home which could result in a fall. This includes throw rugs or furniture in walking pathways.  Continue medications as instructed at time of discharge.  You may have some home medications which will be placed on hold until you complete the course of blood thinner medication.  You may start showering once you are discharged home. Do not remove your dressing. Do not put on socks or shoes without following the instructions of your caregivers.   Sit on chairs with arms. Use the chair arms to help push yourself up when arising.  Arrange for the use of a toilet seat elevator so you are not sitting low.   Walk with walker as instructed.  You may resume a sexual relationship in one month or when given the OK by your caregiver.  Use walker as long as suggested by your caregivers.  You may put full weight on your legs and  walk as much as is comfortable. Avoid periods of inactivity such as sitting longer than an hour when not asleep. This helps prevent blood clots.  You may return to work once you are cleared by Engineer, production.  Do not drive a car for 6 weeks or until released by your surgeon.  Do not drive while taking narcotics.  Wear elastic stockings for two weeks following surgery during the day but you may remove then at night.  Make sure you keep all of your appointments after your operation with all of your doctors and caregivers. You should call the office at the above phone number and make an appointment for approximately two weeks after the date of your surgery. Please pick up a stool softener and laxative for home use as long as you are requiring pain medications.  ICE to the affected hip every three hours for 30 minutes at a time and then as needed for pain and swelling. Continue to use ice on the hip for pain and swelling from surgery. You may notice swelling that will progress down to the foot and ankle.  This is normal after surgery.  Elevate the leg when you are not up walking on it.   It is important for you to complete the blood thinner medication as prescribed by your doctor.  Continue to use the breathing machine which will help keep your temperature down.  It is common for your temperature to cycle up and down following surgery, especially at night when you are not up moving around and exerting yourself.  The breathing machine keeps your lungs expanded and your temperature down.  RANGE OF MOTION AND STRENGTHENING EXERCISES  These exercises are designed to help you keep full movement of your hip joint. Follow your caregiver's or physical therapist's instructions. Perform all exercises about fifteen times, three times per day or as directed. Exercise both hips, even if you have had only one joint replacement. These exercises can be done on a training (exercise) mat, on the floor, on a table or on a  bed. Use whatever works the best and is most comfortable for you. Use music or television while you are exercising so that the exercises are a pleasant break in your day. This will make your life better with the exercises acting as a break in routine you can look forward to.  Lying on your back, slowly slide your foot toward your buttocks, raising your knee up off the floor. Then slowly slide your foot back down until your leg is straight again.  Lying on your back spread your legs as far apart as you can without causing discomfort.  Lying on your side, raise your upper leg and foot straight up from the floor as far as is comfortable. Slowly lower the leg and repeat.  Lying on your back, tighten up the muscle in the front of your thigh (quadriceps muscles). You can do this by keeping your leg straight and trying to raise your heel off the floor. This helps strengthen the largest muscle supporting your knee.  Lying on your back, tighten up the muscles of your buttocks both with the legs straight and with the knee bent at a comfortable angle while keeping your heel on the floor.   SKILLED REHAB INSTRUCTIONS: If the patient is transferred to a skilled rehab facility following release from the hospital, a list of the current medications will be sent to the facility for the patient to continue.  When discharged from the skilled rehab facility, please have the facility set up the patient's [place] prior to being released. Also, the skilled facility will be responsible for providing the patient with their medications at time of release from the facility to include their pain medication and their blood thinner medication. If the patient is still at the rehab facility at time of the two week follow up appointment, the skilled rehab facility will also need to assist the patient in arranging follow up appointment in our office and any transportation needs.  MAKE SURE YOU:  Understand these  instructions.  Will watch your condition.  Will get help right away if you are not doing well or get worse.  Pick up stool softner and laxative for home use following surgery while on pain medications. Do not remove your dressing. The dressing is waterproof--it is OK to take showers. Continue to use ice for pain and swelling after surgery. Do not use any lotions or creams on the incision until instructed by your surgeon. Total Hip Protocol.

## 2016-03-02 NOTE — Progress Notes (Signed)
SATURATION QUALIFICATIONS: (This note is used to comply with regulatory documentation for home oxygen)  Patient Saturations on Room Air at Rest = 89-91%  Patient Saturations on Room Air while Ambulating = 79%  Patient Saturations on 2L Liters of oxygen while Ambulating = 98%  Please briefly explain why patient needs home oxygen: Patient has decreased oxygen saturations on room air with ambulation and at times, at rest. She has a history of sleep apnea.

## 2016-03-02 NOTE — Care Management (Signed)
Home Health RN added to orders. Patient will be discharged on Oxygen. Cm notified Ubaldo Glassing, [facility] Liaison.

## 2016-03-02 NOTE — Progress Notes (Addendum)
   Subjective:  Patient reports pain as mild to moderate.  No c/o. Requires 2L O2 by [place] to keep sats > 90.  Objective:   VITALS:   Vitals:   03/01/16 1716 03/01/16 2113 03/02/16 0643 03/02/16 0835  BP:  (!) 108/53 (!) 105/50 (!) 109/52  Pulse:  76 75 76  Resp:  15 16   Temp: 98.3 F (36.8 C) 98.5 F (36.9 C) 97.6 F (36.4 C)   TempSrc: Oral Oral Oral   SpO2:  91% 96%   Weight:      Height:        ABD soft Sensation intact distally Intact pulses distally Dorsiflexion/Plantar flexion intact Incision: dressing C/D/I Compartment soft   Lab Results  Component Value Date   WBC 14.0 (H) 03/02/2016   HGB 9.3 (L) 03/02/2016   HCT 29.1 (L) 03/02/2016   MCV 97.0 03/02/2016   PLT 187 03/02/2016   BMET    Component Value Date/Time   NA 139 03/02/2016 0411   K 3.9 03/02/2016 0411   CL 104 03/02/2016 0411   CO2 29 03/02/2016 0411   GLUCOSE 154 (H) 03/02/2016 0411   BUN 11 03/02/2016 0411   CREATININE 0.71 03/02/2016 0411   CREATININE 0.82 03/19/2013 1535   CALCIUM 8.3 (L) 03/02/2016 0411   GFRNONAA >60 03/02/2016 0411   GFRAA >60 03/02/2016 0411     Assessment/Plan: 2 Days Post-Op   Principal Problem:   Primary osteoarthritis of right hip Active Problems:   History of total right hip arthroplasty   Hypoxemia   Symptomatic anemia   Hypokalemia   WBAT with walker DVT ppx: ASA, SCDs, TEDs PO pain control PT/OT Hypoxia: chest CT (-) for PE, continue IS, cough, deep breathe Dispo: d/c home with HHPT, hospitalist recommends home O2    Octivia Canion, Horald Pollen 03/02/2016, 12:52 PM   Rod Can, MD Cell [phone]

## 2016-03-02 NOTE — Evaluation (Addendum)
Occupational Therapy Evaluation/Discharge Patient Details Name: RIKKA PARAISO MRN: [id number] DOB: [birth year] Today's Date: 03/02/2016    History of Present Illness NISHITA VETH is an 70 y.o. female breast cancer status post bilateral mastectomy, diverticulosis, GERD, thyroid dysfunction, HTN, OSA presenting to [facility] for elective total hip arthroplasty from an anterior approach. Performed on 02/29/2016   Clinical Impression   This 70 y.o female admitted and underwent the above. Prior to admission she was independent with assistive devices for ADLs. She currently requires assistance with dressing, bathing, transfers and functional mobility post-operatively. Pt required min guard assistance for safety during functional mobility tasks as well as minimum assistance for tub/shower simulated transfer. At rest prior to activity SpO2 on room air at 89-91%. With activity SpO2 dropped to 79%. When returned to rest SpO2 on room air returned to 93%.  Pt received all education required and verbalized understanding. Pt has 24 hour assistance and supervision from son at home. OT signing off.    Follow Up Recommendations  No OT follow up;Supervision/Assistance - 24 hour    Equipment Recommendations  None recommended by OT    Recommendations for Other Services       Precautions / Restrictions Precautions Precautions: Fall Restrictions Weight Bearing Restrictions: Yes RLE Weight Bearing: Weight bearing as tolerated      Mobility Bed Mobility               General bed mobility comments: pt sitting OOB in recliner when OT entered room  Transfers Overall transfer level: Needs assistance Equipment used: Rolling walker (2 wheeled) Transfers: Sit to/from Stand Sit to Stand: Min guard         General transfer comment: Pt required min guard assist for safety and balance, VS's for hand placement and RW use    Balance Overall balance assessment: Needs  assistance Sitting-balance support: Feet supported;No upper extremity supported Sitting balance-Leahy Scale: Good     Standing balance support: Single extremity supported;During functional activity Standing balance-Leahy Scale: Poor                              ADL Overall ADL's : Needs assistance/impaired     Grooming: Supervision/safety;Set up;Sitting;Wash/dry hands;Oral care;Wash/dry face   Upper Body Bathing: Supervision/ safety;Sitting   Lower Body Bathing: Minimal assistance;Sit to/from stand Lower Body Bathing Details (indicate cue type and reason): Educated pt on use of long handled sponge Upper Body Dressing : Supervision/safety;Sitting   Lower Body Dressing: Minimal assistance;Sit to/from stand   Toilet Transfer: Min guard;Ambulation;BSC;RW   Toileting- Water quality scientist and Hygiene: Min guard;Sit to/from stand   Tub/ Shower Transfer: Minimal assistance;Ambulation;Rolling walker;Grab bars   Functional mobility during ADLs: Minimal assistance;Rolling walker       Vision Vision Assessment?: No apparent visual deficits   Perception     Praxis      Pertinent Vitals/Pain Pain Assessment: 0-10 Pain Score: 5  Pain Location: R hip Pain Descriptors / Indicators: Burning Pain Intervention(s): Monitored during session;Repositioned;Ice applied   SpO2 on room air, 89-91% at rest  SpO2 on room air, 79% during activity  SpO2 on room air, 93% at rest     Hand Dominance Right   Extremity/Trunk Assessment Upper Extremity Assessment Upper Extremity Assessment: Overall WFL for tasks assessed   Lower Extremity Assessment Lower Extremity Assessment: RLE deficits/detail RLE Deficits / Details: decreased ROM and strength as expected post op       Communication Communication  Communication: No difficulties   Cognition Arousal/Alertness: Awake/alert Behavior During Therapy: WFL for tasks assessed/performed Overall Cognitive Status: Within  Functional Limits for tasks assessed                     General Comments       Exercises Exercises: Total Joint     Shoulder Instructions      Home Living Family/patient expects to be discharged to:: Private residence Living Arrangements: Spouse/significant other;Children Available Help at Discharge: Family;Available 24 hours/day Type of Home: House Home Access: Level entry     Home Layout: Two level;Able to live on main level with bedroom/bathroom     Bathroom Shower/Tub: Tub/shower unit Shower/tub characteristics: Architectural technologist: Standard     Home Equipment: Bedside commode;Grab bars - tub/shower;Grab bars - toilet;Hand held Tourist information centre manager - 2 wheels;Cane - single point;Shower seat          Prior Functioning/Environment Level of Independence: Independent with assistive device(s)        Comments: used RW when cooking        OT Problem List: Impaired balance (sitting and/or standing);Pain;Decreased knowledge of use of DME or AE;Cardiopulmonary status limiting activity;Decreased strength;Decreased range of motion   OT Treatment/Interventions:      OT Goals(Current goals can be found in the care plan section) Acute Rehab OT Goals Patient Stated Goal: To return home OT Goal Formulation: With patient Time For Goal Achievement: 03/16/16 Potential to Achieve Goals: Good  OT Frequency:     Barriers to D/C:            Co-evaluation              End of Session Equipment Utilized During Treatment: Rolling walker Nurse Communication: Mobility status;Other (comment) (O2 stats during activity))  Activity Tolerance: Patient tolerated treatment well Patient left: in chair;with call bell/phone within reach   Time: [id number] OT Time Calculation (min): 18 min Charges:  OT General Charges $OT Visit: 1 Procedure OT Evaluation $OT Eval Moderate Complexity: 1 Procedure G-Codes:    Masie Bermingham A Graylen Noboa, MS OTR/L 03/02/2016, 11:17 AM

## 2016-03-02 NOTE — Progress Notes (Signed)
Physical Therapy Treatment Patient Details Name: Karen [name] MRN: [id number] DOB: May 25, 1946 Today's Date: 03/02/2016    History of Present Illness Karen [name] is an 70 y.o. female breast cancer status post bilateral mastectomy, diverticulosis, GERD, thyroid dysfunction, HTN, OSA presenting to [facility] for elective total hip arthroplasty from an anterior approach. Performed on 02/29/2016    PT Comments    Pt presented supine in bed with HOB elevated, awake and willing to participate in therapy session. Pt's son was present throughout session. Pt ambulated on RA for approximately 50 ft and then ambulated 50 ft on 2L O2 via [place]. With supplemental O2, pt's SPO2 maintained at >90%. Pt was left sitting EOB at her request as her lunch was being delivered, and with 2L O2 via [place] still in place. Pt would continue to benefit from skilled physical therapy services at this time while admitted and after d/c to address her limitations in order to improve her overall safety and independence with functional mobility.   Follow Up Recommendations  Home health PT;Supervision - Intermittent     Equipment Recommendations  None recommended by PT    Recommendations for Other Services       Precautions / Restrictions Precautions Precautions: Fall Restrictions Weight Bearing Restrictions: Yes RLE Weight Bearing: Weight bearing as tolerated    Mobility  Bed Mobility Overal bed mobility: Needs Assistance Bed Mobility: Supine to Sit     Supine to sit: Supervision;HOB elevated     General bed mobility comments: pt required increased time  Transfers Overall transfer level: Needs assistance Equipment used: Rolling walker (2 wheeled) Transfers: Sit to/from Stand Sit to Stand: Min guard         General transfer comment: Pt required min guard assist for safety and balance, VS's for hand placement and RW use  Ambulation/Gait Ambulation/Gait assistance: Min guard Ambulation Distance  (Feet): 100 Feet Assistive device: Rolling walker (2 wheeled) Gait Pattern/deviations: Step-to pattern;Decreased step length - left;Decreased stance time - right;Decreased weight shift to right Gait velocity: decreased Gait velocity interpretation: Below normal speed for age/gender General Gait Details: initially pt ambulating on RA for the first 50 ft, then ambulating on 2L O2 for the second 50 ft. Pt's SPO2 maintained above 90% during ambulation with 2L O2.   Stairs            Wheelchair Mobility    Modified Rankin (Stroke Patients Only)       Balance Overall balance assessment: Needs assistance Sitting-balance support: Feet supported;No upper extremity supported Sitting balance-Leahy Scale: Good     Standing balance support: During functional activity;Bilateral upper extremity supported Standing balance-Leahy Scale: Poor                      Cognition Arousal/Alertness: Awake/alert Behavior During Therapy: WFL for tasks assessed/performed Overall Cognitive Status: Within Functional Limits for tasks assessed                      Exercises      General Comments        Pertinent Vitals/Pain Pain Assessment: 0-10 Pain Score: 6  Pain Location: R hip Pain Descriptors / Indicators: Guarding;Grimacing Pain Intervention(s): Monitored during session;Repositioned    Home Living                      Prior Function            PT Goals (current goals can now be  found in the care plan section) Acute Rehab PT Goals Patient Stated Goal: To return home PT Goal Formulation: With patient/family Time For Goal Achievement: 03/05/16 Potential to Achieve Goals: Good Progress towards PT goals: Progressing toward goals    Frequency    7X/week      PT Plan Current plan remains appropriate    Co-evaluation             End of Session Equipment Utilized During Treatment: Gait belt;Oxygen Activity Tolerance: Patient limited by  pain;Patient limited by fatigue Patient left: in bed;with call bell/phone within reach;with family/visitor present     Time: 1215-1230 PT Time Calculation (min) (ACUTE ONLY): 15 min  Charges:  $Gait Training: 8-22 mins                    G Codes:      Karen [name] 2016-03-26, 2:00 PM [place], [place], DPT [phone]

## 2016-03-04 DIAGNOSIS — D649 Anemia, unspecified: Secondary | ICD-10-CM | POA: Diagnosis not present

## 2016-03-04 DIAGNOSIS — Z9013 Acquired absence of bilateral breasts and nipples: Secondary | ICD-10-CM | POA: Diagnosis not present

## 2016-03-04 DIAGNOSIS — Z7982 Long term (current) use of aspirin: Secondary | ICD-10-CM | POA: Diagnosis not present

## 2016-03-04 DIAGNOSIS — Z87891 Personal history of nicotine dependence: Secondary | ICD-10-CM | POA: Diagnosis not present

## 2016-03-04 DIAGNOSIS — Z853 Personal history of malignant neoplasm of breast: Secondary | ICD-10-CM | POA: Diagnosis not present

## 2016-03-04 DIAGNOSIS — Z79891 Long term (current) use of opiate analgesic: Secondary | ICD-10-CM | POA: Diagnosis not present

## 2016-03-04 DIAGNOSIS — I10 Essential (primary) hypertension: Secondary | ICD-10-CM | POA: Diagnosis not present

## 2016-03-04 DIAGNOSIS — Z471 Aftercare following joint replacement surgery: Secondary | ICD-10-CM | POA: Diagnosis not present

## 2016-03-04 DIAGNOSIS — R509 Fever, unspecified: Secondary | ICD-10-CM | POA: Diagnosis not present

## 2016-03-07 DIAGNOSIS — Z7982 Long term (current) use of aspirin: Secondary | ICD-10-CM | POA: Diagnosis not present

## 2016-03-07 DIAGNOSIS — Z79891 Long term (current) use of opiate analgesic: Secondary | ICD-10-CM | POA: Diagnosis not present

## 2016-03-07 DIAGNOSIS — R509 Fever, unspecified: Secondary | ICD-10-CM | POA: Diagnosis not present

## 2016-03-07 DIAGNOSIS — D649 Anemia, unspecified: Secondary | ICD-10-CM | POA: Diagnosis not present

## 2016-03-07 DIAGNOSIS — Z87891 Personal history of nicotine dependence: Secondary | ICD-10-CM | POA: Diagnosis not present

## 2016-03-07 DIAGNOSIS — Z853 Personal history of malignant neoplasm of breast: Secondary | ICD-10-CM | POA: Diagnosis not present

## 2016-03-07 DIAGNOSIS — Z9013 Acquired absence of bilateral breasts and nipples: Secondary | ICD-10-CM | POA: Diagnosis not present

## 2016-03-07 DIAGNOSIS — Z471 Aftercare following joint replacement surgery: Secondary | ICD-10-CM | POA: Diagnosis not present

## 2016-03-07 DIAGNOSIS — I10 Essential (primary) hypertension: Secondary | ICD-10-CM | POA: Diagnosis not present

## 2016-03-08 DIAGNOSIS — Z853 Personal history of malignant neoplasm of breast: Secondary | ICD-10-CM | POA: Diagnosis not present

## 2016-03-08 DIAGNOSIS — Z9013 Acquired absence of bilateral breasts and nipples: Secondary | ICD-10-CM | POA: Diagnosis not present

## 2016-03-08 DIAGNOSIS — Z79891 Long term (current) use of opiate analgesic: Secondary | ICD-10-CM | POA: Diagnosis not present

## 2016-03-08 DIAGNOSIS — I10 Essential (primary) hypertension: Secondary | ICD-10-CM | POA: Diagnosis not present

## 2016-03-08 DIAGNOSIS — D649 Anemia, unspecified: Secondary | ICD-10-CM | POA: Diagnosis not present

## 2016-03-08 DIAGNOSIS — Z87891 Personal history of nicotine dependence: Secondary | ICD-10-CM | POA: Diagnosis not present

## 2016-03-08 DIAGNOSIS — R509 Fever, unspecified: Secondary | ICD-10-CM | POA: Diagnosis not present

## 2016-03-08 DIAGNOSIS — Z471 Aftercare following joint replacement surgery: Secondary | ICD-10-CM | POA: Diagnosis not present

## 2016-03-08 DIAGNOSIS — Z7982 Long term (current) use of aspirin: Secondary | ICD-10-CM | POA: Diagnosis not present

## 2016-03-09 DIAGNOSIS — D649 Anemia, unspecified: Secondary | ICD-10-CM | POA: Diagnosis not present

## 2016-03-09 DIAGNOSIS — I10 Essential (primary) hypertension: Secondary | ICD-10-CM | POA: Diagnosis not present

## 2016-03-09 DIAGNOSIS — Z9013 Acquired absence of bilateral breasts and nipples: Secondary | ICD-10-CM | POA: Diagnosis not present

## 2016-03-09 DIAGNOSIS — R509 Fever, unspecified: Secondary | ICD-10-CM | POA: Diagnosis not present

## 2016-03-09 DIAGNOSIS — Z7982 Long term (current) use of aspirin: Secondary | ICD-10-CM | POA: Diagnosis not present

## 2016-03-09 DIAGNOSIS — Z87891 Personal history of nicotine dependence: Secondary | ICD-10-CM | POA: Diagnosis not present

## 2016-03-09 DIAGNOSIS — Z471 Aftercare following joint replacement surgery: Secondary | ICD-10-CM | POA: Diagnosis not present

## 2016-03-09 DIAGNOSIS — Z79891 Long term (current) use of opiate analgesic: Secondary | ICD-10-CM | POA: Diagnosis not present

## 2016-03-09 DIAGNOSIS — Z853 Personal history of malignant neoplasm of breast: Secondary | ICD-10-CM | POA: Diagnosis not present

## 2016-03-11 DIAGNOSIS — Z79891 Long term (current) use of opiate analgesic: Secondary | ICD-10-CM | POA: Diagnosis not present

## 2016-03-11 DIAGNOSIS — Z87891 Personal history of nicotine dependence: Secondary | ICD-10-CM | POA: Diagnosis not present

## 2016-03-11 DIAGNOSIS — Z7982 Long term (current) use of aspirin: Secondary | ICD-10-CM | POA: Diagnosis not present

## 2016-03-11 DIAGNOSIS — Z853 Personal history of malignant neoplasm of breast: Secondary | ICD-10-CM | POA: Diagnosis not present

## 2016-03-11 DIAGNOSIS — D649 Anemia, unspecified: Secondary | ICD-10-CM | POA: Diagnosis not present

## 2016-03-11 DIAGNOSIS — R509 Fever, unspecified: Secondary | ICD-10-CM | POA: Diagnosis not present

## 2016-03-11 DIAGNOSIS — Z471 Aftercare following joint replacement surgery: Secondary | ICD-10-CM | POA: Diagnosis not present

## 2016-03-11 DIAGNOSIS — I10 Essential (primary) hypertension: Secondary | ICD-10-CM | POA: Diagnosis not present

## 2016-03-11 DIAGNOSIS — Z9013 Acquired absence of bilateral breasts and nipples: Secondary | ICD-10-CM | POA: Diagnosis not present

## 2016-03-14 DIAGNOSIS — Z96641 Presence of right artificial hip joint: Secondary | ICD-10-CM | POA: Diagnosis not present

## 2016-03-15 DIAGNOSIS — Z9013 Acquired absence of bilateral breasts and nipples: Secondary | ICD-10-CM | POA: Diagnosis not present

## 2016-03-15 DIAGNOSIS — Z471 Aftercare following joint replacement surgery: Secondary | ICD-10-CM | POA: Diagnosis not present

## 2016-03-15 DIAGNOSIS — Z7982 Long term (current) use of aspirin: Secondary | ICD-10-CM | POA: Diagnosis not present

## 2016-03-15 DIAGNOSIS — Z79891 Long term (current) use of opiate analgesic: Secondary | ICD-10-CM | POA: Diagnosis not present

## 2016-03-15 DIAGNOSIS — I10 Essential (primary) hypertension: Secondary | ICD-10-CM | POA: Diagnosis not present

## 2016-03-15 DIAGNOSIS — Z87891 Personal history of nicotine dependence: Secondary | ICD-10-CM | POA: Diagnosis not present

## 2016-03-15 DIAGNOSIS — R509 Fever, unspecified: Secondary | ICD-10-CM | POA: Diagnosis not present

## 2016-03-15 DIAGNOSIS — D649 Anemia, unspecified: Secondary | ICD-10-CM | POA: Diagnosis not present

## 2016-03-15 DIAGNOSIS — Z853 Personal history of malignant neoplasm of breast: Secondary | ICD-10-CM | POA: Diagnosis not present

## 2016-03-17 DIAGNOSIS — Z853 Personal history of malignant neoplasm of breast: Secondary | ICD-10-CM | POA: Diagnosis not present

## 2016-03-17 DIAGNOSIS — Z9013 Acquired absence of bilateral breasts and nipples: Secondary | ICD-10-CM | POA: Diagnosis not present

## 2016-03-17 DIAGNOSIS — Z79891 Long term (current) use of opiate analgesic: Secondary | ICD-10-CM | POA: Diagnosis not present

## 2016-03-17 DIAGNOSIS — I10 Essential (primary) hypertension: Secondary | ICD-10-CM | POA: Diagnosis not present

## 2016-03-17 DIAGNOSIS — Z7982 Long term (current) use of aspirin: Secondary | ICD-10-CM | POA: Diagnosis not present

## 2016-03-17 DIAGNOSIS — R509 Fever, unspecified: Secondary | ICD-10-CM | POA: Diagnosis not present

## 2016-03-17 DIAGNOSIS — Z471 Aftercare following joint replacement surgery: Secondary | ICD-10-CM | POA: Diagnosis not present

## 2016-03-17 DIAGNOSIS — D649 Anemia, unspecified: Secondary | ICD-10-CM | POA: Diagnosis not present

## 2016-03-17 DIAGNOSIS — Z87891 Personal history of nicotine dependence: Secondary | ICD-10-CM | POA: Diagnosis not present

## 2016-03-22 DIAGNOSIS — I10 Essential (primary) hypertension: Secondary | ICD-10-CM | POA: Diagnosis not present

## 2016-03-22 DIAGNOSIS — D649 Anemia, unspecified: Secondary | ICD-10-CM | POA: Diagnosis not present

## 2016-03-22 DIAGNOSIS — Z9013 Acquired absence of bilateral breasts and nipples: Secondary | ICD-10-CM | POA: Diagnosis not present

## 2016-03-22 DIAGNOSIS — Z79891 Long term (current) use of opiate analgesic: Secondary | ICD-10-CM | POA: Diagnosis not present

## 2016-03-22 DIAGNOSIS — Z87891 Personal history of nicotine dependence: Secondary | ICD-10-CM | POA: Diagnosis not present

## 2016-03-22 DIAGNOSIS — Z471 Aftercare following joint replacement surgery: Secondary | ICD-10-CM | POA: Diagnosis not present

## 2016-03-22 DIAGNOSIS — Z7982 Long term (current) use of aspirin: Secondary | ICD-10-CM | POA: Diagnosis not present

## 2016-03-22 DIAGNOSIS — R509 Fever, unspecified: Secondary | ICD-10-CM | POA: Diagnosis not present

## 2016-03-22 DIAGNOSIS — Z853 Personal history of malignant neoplasm of breast: Secondary | ICD-10-CM | POA: Diagnosis not present

## 2016-04-12 DIAGNOSIS — Z96641 Presence of right artificial hip joint: Secondary | ICD-10-CM | POA: Diagnosis not present

## 2016-04-25 DIAGNOSIS — G8929 Other chronic pain: Secondary | ICD-10-CM | POA: Diagnosis not present

## 2016-04-25 DIAGNOSIS — R1030 Lower abdominal pain, unspecified: Secondary | ICD-10-CM | POA: Diagnosis not present

## 2016-04-25 DIAGNOSIS — R404 Transient alteration of awareness: Secondary | ICD-10-CM | POA: Diagnosis not present

## 2016-04-25 DIAGNOSIS — Z79899 Other long term (current) drug therapy: Secondary | ICD-10-CM | POA: Diagnosis not present

## 2016-04-25 DIAGNOSIS — M5489 Other dorsalgia: Secondary | ICD-10-CM | POA: Diagnosis not present

## 2016-04-25 DIAGNOSIS — I10 Essential (primary) hypertension: Secondary | ICD-10-CM | POA: Diagnosis not present

## 2016-04-25 DIAGNOSIS — Z853 Personal history of malignant neoplasm of breast: Secondary | ICD-10-CM | POA: Diagnosis not present

## 2016-04-25 DIAGNOSIS — E039 Hypothyroidism, unspecified: Secondary | ICD-10-CM | POA: Diagnosis not present

## 2016-04-25 DIAGNOSIS — Z9013 Acquired absence of bilateral breasts and nipples: Secondary | ICD-10-CM | POA: Diagnosis not present

## 2016-04-25 DIAGNOSIS — Z96641 Presence of right artificial hip joint: Secondary | ICD-10-CM | POA: Diagnosis not present

## 2016-04-25 DIAGNOSIS — M545 Low back pain: Secondary | ICD-10-CM | POA: Diagnosis not present

## 2016-04-25 DIAGNOSIS — R531 Weakness: Secondary | ICD-10-CM | POA: Diagnosis not present

## 2016-06-21 DIAGNOSIS — G5691 Unspecified mononeuropathy of right upper limb: Secondary | ICD-10-CM | POA: Diagnosis not present

## 2016-06-21 DIAGNOSIS — M792 Neuralgia and neuritis, unspecified: Secondary | ICD-10-CM | POA: Diagnosis not present

## 2016-07-15 ENCOUNTER — Encounter: Payer: Self-pay | Admitting: Internal Medicine

## 2016-07-15 DIAGNOSIS — E039 Hypothyroidism, unspecified: Secondary | ICD-10-CM | POA: Diagnosis not present

## 2016-07-15 DIAGNOSIS — I10 Essential (primary) hypertension: Secondary | ICD-10-CM | POA: Diagnosis not present

## 2016-07-15 DIAGNOSIS — R7301 Impaired fasting glucose: Secondary | ICD-10-CM | POA: Diagnosis not present

## 2016-07-20 DIAGNOSIS — M25511 Pain in right shoulder: Secondary | ICD-10-CM | POA: Diagnosis not present

## 2016-07-20 DIAGNOSIS — E039 Hypothyroidism, unspecified: Secondary | ICD-10-CM | POA: Diagnosis not present

## 2016-07-20 DIAGNOSIS — I10 Essential (primary) hypertension: Secondary | ICD-10-CM | POA: Diagnosis not present

## 2016-07-20 DIAGNOSIS — Z0000 Encounter for general adult medical examination without abnormal findings: Secondary | ICD-10-CM | POA: Diagnosis not present

## 2016-07-20 DIAGNOSIS — R7301 Impaired fasting glucose: Secondary | ICD-10-CM | POA: Diagnosis not present

## 2016-10-25 DIAGNOSIS — I10 Essential (primary) hypertension: Secondary | ICD-10-CM | POA: Diagnosis not present

## 2016-10-25 DIAGNOSIS — M25511 Pain in right shoulder: Secondary | ICD-10-CM | POA: Diagnosis not present

## 2016-10-25 DIAGNOSIS — Z0000 Encounter for general adult medical examination without abnormal findings: Secondary | ICD-10-CM | POA: Diagnosis not present

## 2016-10-25 DIAGNOSIS — R7301 Impaired fasting glucose: Secondary | ICD-10-CM | POA: Diagnosis not present

## 2016-10-25 DIAGNOSIS — E039 Hypothyroidism, unspecified: Secondary | ICD-10-CM | POA: Diagnosis not present

## 2016-12-20 IMAGING — DX DG LUMBAR SPINE 2-3V
3 series · 3 of 3 positions shown · non-contrast
Comparison: 09/25/2014

CLINICAL DATA: Infection and surgical site.

EXAM:
LUMBAR SPINE - 2-3 VIEW

[l-spine ap]
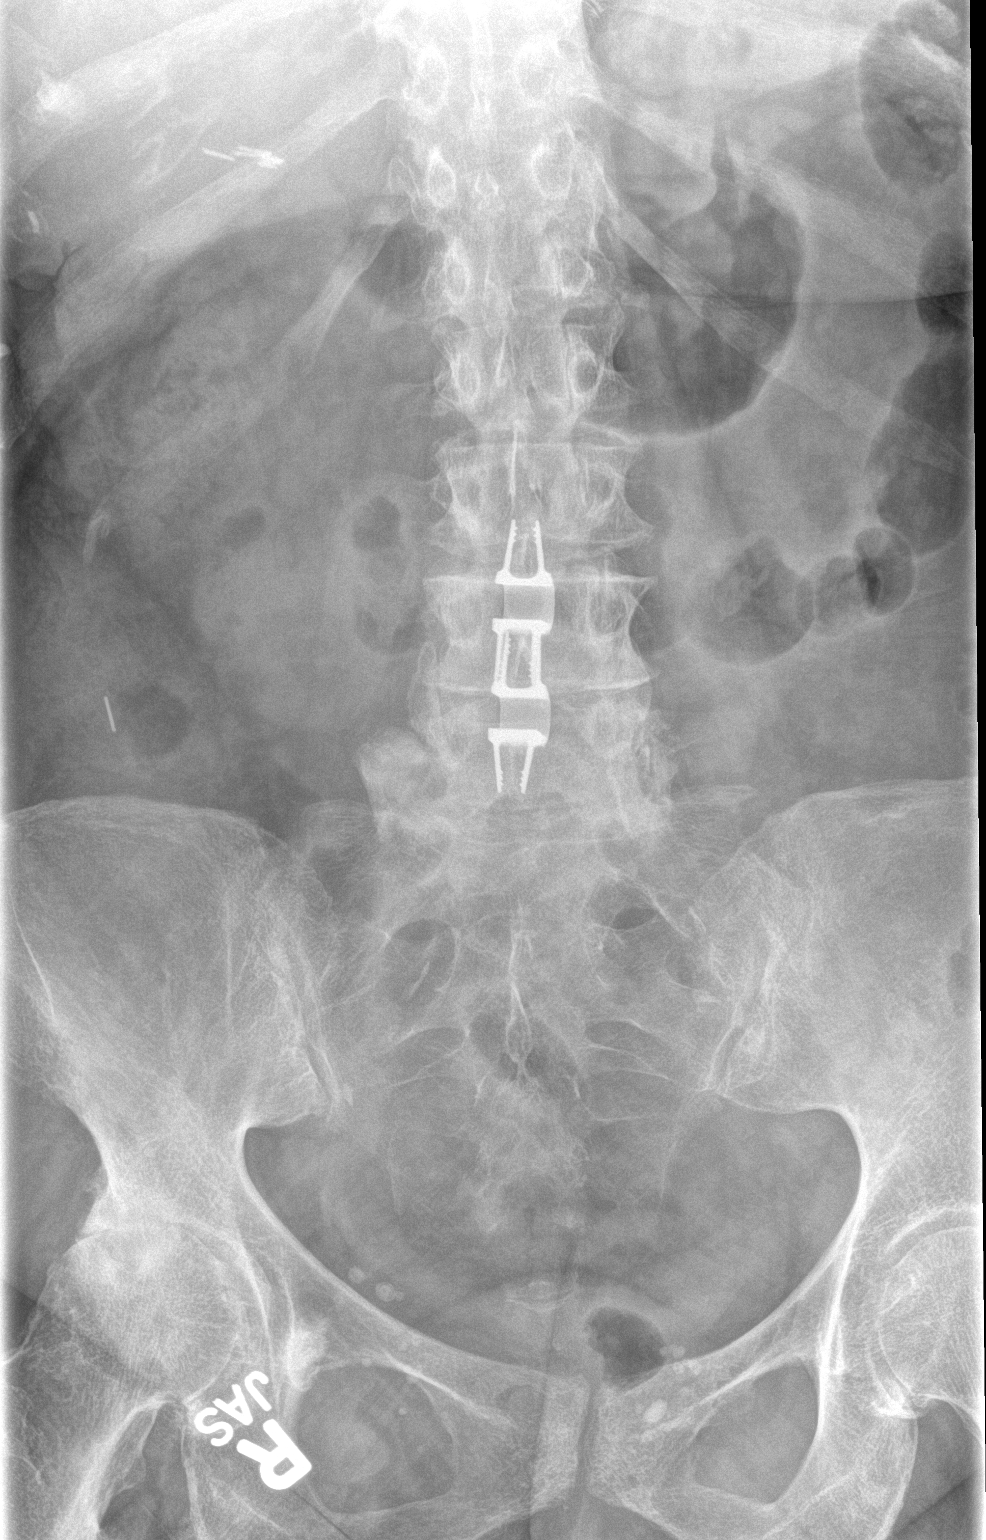

[l-spine lat]
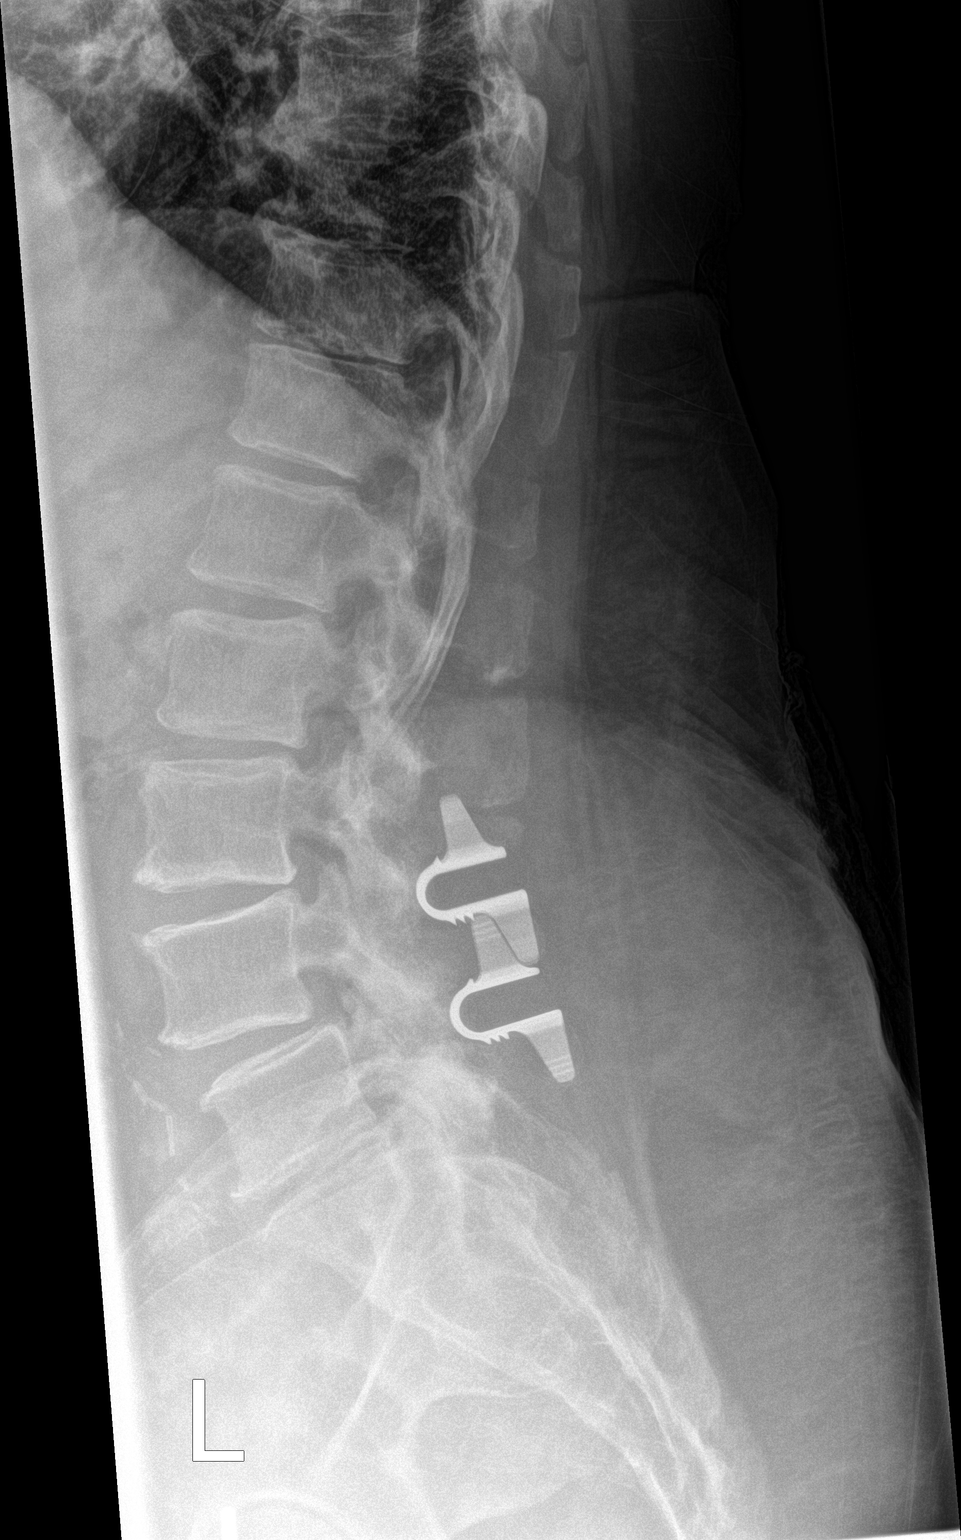

[l-spine spot]
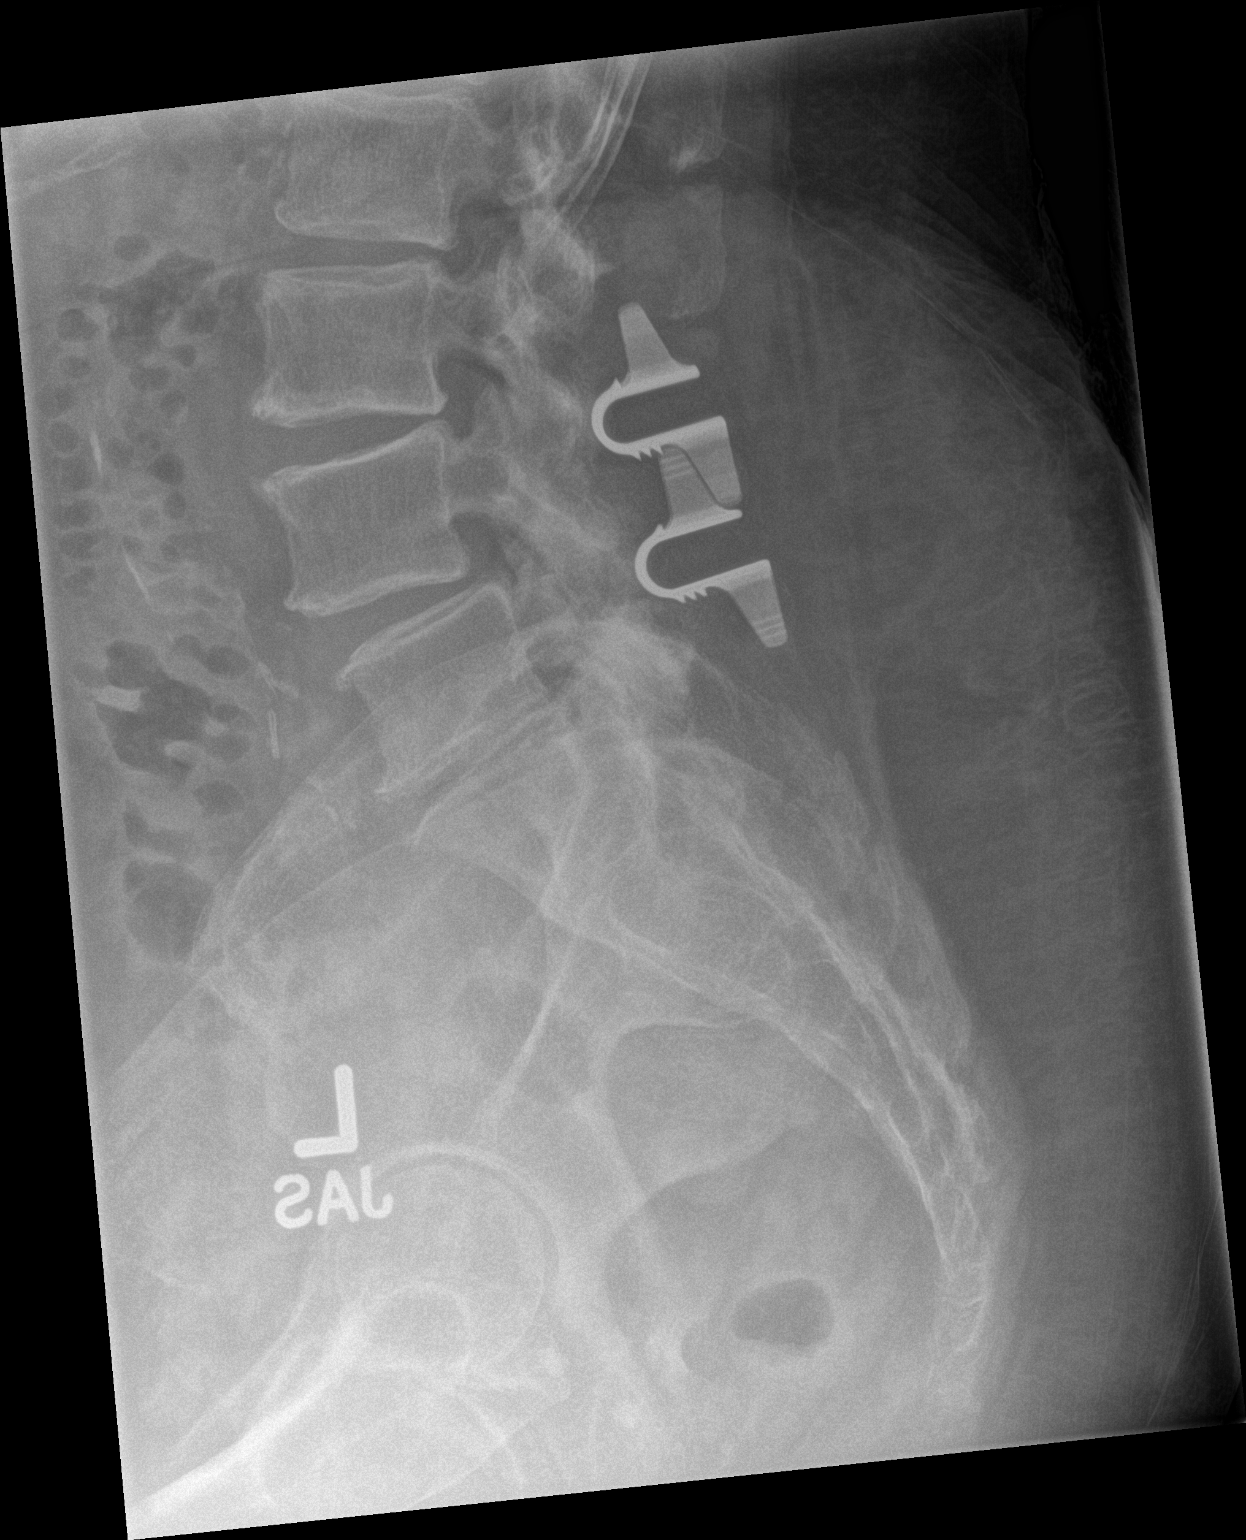

[3 of 3 positions shown; findings below may reference images not displayed]

FINDINGS: Posterior hardware noted at the L3-4 and L4-5 levels. Appearance is
stable since prior study. Degenerative disc and facet disease
throughout the lumbar spine. No fracture. No acute bony abnormality.
IMPRESSION: Postoperative changes in the lower lumbar spine. Degenerative disc
and facet disease. No acute findings.

## 2017-01-17 DIAGNOSIS — I10 Essential (primary) hypertension: Secondary | ICD-10-CM | POA: Diagnosis not present

## 2017-01-17 DIAGNOSIS — Z1159 Encounter for screening for other viral diseases: Secondary | ICD-10-CM | POA: Diagnosis not present

## 2017-01-17 DIAGNOSIS — E039 Hypothyroidism, unspecified: Secondary | ICD-10-CM | POA: Diagnosis not present

## 2017-01-17 DIAGNOSIS — R7301 Impaired fasting glucose: Secondary | ICD-10-CM | POA: Diagnosis not present

## 2017-01-19 DIAGNOSIS — I10 Essential (primary) hypertension: Secondary | ICD-10-CM | POA: Diagnosis not present

## 2017-01-19 DIAGNOSIS — E039 Hypothyroidism, unspecified: Secondary | ICD-10-CM | POA: Diagnosis not present

## 2017-01-19 DIAGNOSIS — L298 Other pruritus: Secondary | ICD-10-CM | POA: Diagnosis not present

## 2017-01-19 DIAGNOSIS — R7301 Impaired fasting glucose: Secondary | ICD-10-CM | POA: Diagnosis not present

## 2017-02-02 ENCOUNTER — Encounter: Payer: Self-pay | Admitting: *Deleted

## 2017-02-03 ENCOUNTER — Ambulatory Visit (INDEPENDENT_AMBULATORY_CARE_PROVIDER_SITE_OTHER): Payer: Medicare Other | Admitting: Obstetrics and Gynecology

## 2017-02-03 ENCOUNTER — Telehealth: Payer: Self-pay | Admitting: Obstetrics and Gynecology

## 2017-02-03 ENCOUNTER — Encounter: Payer: Self-pay | Admitting: Obstetrics and Gynecology

## 2017-02-03 VITALS — BP 162/90 | HR 87 | Ht 62.5 in | Wt 196.0 lb

## 2017-02-03 DIAGNOSIS — N904 Leukoplakia of vulva: Secondary | ICD-10-CM

## 2017-02-03 MED ORDER — CLOBETASOL PROPIONATE 0.05 % EX CREA: 1.0000 "application " | TOPICAL_CREAM | Freq: Two times a day (BID) | CUTANEOUS | 0 refills | Status: DC

## 2017-02-03 NOTE — Telephone Encounter (Signed)
Apothecary to Let Patient Know When Her Medications Are Ready

## 2017-02-03 NOTE — Progress Notes (Signed)
[facility] Visit  02/03/2017            Patient name: Karen [name] MRN [id number]  Date of birth: 1946-04-23  CC & HPI:  Karen [name] is a 70 y.o. female presenting today for vaginal itching with onset 5 years ago. Pt has seen her PCP for this issue, and has tried multiple medications with no relief. She says it began when she had severe cystocele. Her OB/GYN in the past, dr. Barrie Dunker provided her with a pessary, which was painful for her to use and provided no relief. She was treated with "multiple creams " Associated symptoms of light clear discharge. Pt states the itching wakes her up at night. She feels it mainly on the vulva, and denies itching inside the vagina. She denies any past yeast infections. She has had a hysterectomy, and had a bladder tack done by Dr. Leanor Kail.  ROS:  ROS +vaginal itching +light clear discharge -fever  Pertinent History Reviewed:   Reviewed: Significant for bilateral mastectomy,  Medical         Past Medical History:  Diagnosis Date  . Arthritis    right hip  . Breast cancer [facility]) 2004   bilateral mastectomy-all left lymph nodes removed; chemo. Patient states she had "spot" on the right breast as well.  . Diverticulitis   . GERD (gastroesophageal reflux disease)    since gallbladder removed, no more GERD  . Goiter   . HTN (hypertension)   . Hypothyroidism   . Mental disorder    Major depressive disorder; generalized anxiety disorder; mood disorder  . Pneumonia   . Sleep apnea    has never been tested.  was dx with cancer first and had this problem on the back burner  . Tubular adenoma                               Surgical Hx:    Past Surgical History:  Procedure Laterality Date  . ABDOMINAL HYSTERECTOMY    . bilateral mastectomy w/reconstruction  2004  . BREAST SURGERY    . CHOLECYSTECTOMY    . COLONOSCOPY  2008   colon with few diverticula in sigmoid colon  . COLONOSCOPY  02/16/2011   Dr. Gala Romney- colon and rectal polyps=  tubular adenoma, L sided diverticulosis.  . DEBRIDEMENT AND CLOSURE WOUND N/A 12/17/2014   Procedure: CLOSURE WOUND LUMBAR SPINE;  Surgeon: Melina Schools, MD;  Location: [place];  Service: Orthopedics;  Laterality: N/A;  . DILATION AND CURETTAGE OF UTERUS    . ESOPHAGOGASTRODUODENOSCOPY  2008   lipoma distal esophagus, gastritis   . ESOPHAGOGASTRODUODENOSCOPY  02/16/2011   Dr. Gala Romney- normal esophagus, small hiatal hernia o/w normal stomach. normal first and second portion of the duodenum  . HERNIA REPAIR     umbilical hernia was repaired  . INJECTION KNEE Right 11/07/2014   Procedure: KNEE INJECTION;  Surgeon: Melina Schools, MD;  Location: [place];  Service: Orthopedics;  Laterality: Right;  . LUMBAR LAMINECTOMY/DECOMPRESSION MICRODISCECTOMY N/A 09/25/2014   Procedure: DECOMPRESSION L3-L5 WITH INSERTION OF COFLEX   ( 2 LEVELS);  Surgeon: Melina Schools, MD;  Location: [place];  Service: Orthopedics;  Laterality: N/A;  . LUMBAR LAMINECTOMY/DECOMPRESSION MICRODISCECTOMY N/A 11/07/2014   Procedure: IRRIGATION AND DEBRIDEMENT BACK WOUND, PLACEMENT OF ANTIBIOTIC BEADS;  Surgeon: Melina Schools, MD;  Location: [place];  Service: Orthopedics;  Laterality: N/A;  . LUMBAR WOUND DEBRIDEMENT N/A 12/01/2014  Procedure: IRRIGATION AND DEBRIDEMENT OF LUMBAR WOUND WITH VAC PLACEMENT;  Surgeon: Melina Schools, MD;  Location: [place];  Service: Orthopedics;  Laterality: N/A;  . LUMBAR WOUND DEBRIDEMENT N/A 12/03/2014   Procedure: WOUND VAC DRESSING CHANGE ;  Surgeon: Melina Schools, MD;  Location: [place];  Service: Orthopedics;  Laterality: N/A;  Venia Minks DILATION  02/16/2011   Procedure: Venia Minks DILATION;  Surgeon: Daneil Dolin, MD;  [facility];  Service: Endoscopy;  Laterality: N/A;  . MASTECTOMY     bilateral 2004 left side had 17 nodes removed  . PARTIAL HYSTERECTOMY    . SAVORY DILATION  02/16/2011   Procedure: SAVORY DILATION;  Surgeon: Daneil Dolin, MD;  [facility];  Service: Endoscopy;   Laterality: N/A;  . TOTAL HIP ARTHROPLASTY Right 02/29/2016   Procedure: TOTAL HIP ARTHROPLASTY ANTERIOR APPROACH;  Surgeon: Rod Can, MD;  Location: [place];  Service: Orthopedics;  Laterality: Right;   Medications: Reviewed & Updated - see associated section                       Current Outpatient Prescriptions:  .  ALPRAZolam (XANAX) 1 MG tablet, Take 1 tablet (1 mg total) by mouth 3 (three) times daily as needed for anxiety., Disp: 90 tablet, Rfl: 5 .  aspirin 81 MG chewable tablet, Chew 1 tablet (81 mg total) by mouth 2 (two) times daily. (Patient taking differently: Chew 81 mg by mouth daily. ), Disp: 60 tablet, Rfl: 1 .  levothyroxine (SYNTHROID, LEVOTHROID) 100 MCG tablet, Take 100 mcg by mouth every morning. , Disp: , Rfl: 0 .  lisinopril-hydrochlorothiazide (PRINZIDE,ZESTORETIC) 20-12.5 MG per tablet, Take 1 tablet by mouth 2 (two) times daily., Disp: , Rfl:  .  meloxicam (MOBIC) 15 MG tablet, Take 15 mg by mouth daily., Disp: , Rfl:    Social History: Reviewed -  reports that she quit smoking about 21 years ago. She has a 20.00 pack-year smoking history. She has never used smokeless tobacco.  Objective Findings:  Vitals: Blood pressure (!) 162/90, pulse 87, height 5' 2.5" (1.588 m), [name] 196 lb (88.9 kg).  Physical Examination: General appearance - alert, well appearing, and in no distress Mental status - alert, oriented to person, place, and time Pelvic -  VULVA: slighty white, thick tissue on labia majora, labia minora are present VAGINA: normal appearing vagina with normal color and discharge, no lesions, good support, slight cystocele CERVIX: surgically absent UTERUS: surgically absent ADNEXA: surgically absent    Assessment & Plan:   A:  1. Vulvar dystrophy, chronic Mild obesity P:  1. Prescribe steroid cream Temovate 0.05 % 1x daily before bedtime for 1 week, then 3x a week, and Testosterone Propionate 2% cream applied daily Patient follow dry vulva  regimen, loose clothing after written coming home, use of a small fantail assistant drying the perineum post bathing 2. F/u in 3 months to assess medication results      By signing my name below, I, Izna Ahmed, attest that this documentation has been prepared under the direction and in the presence of Jonnie Kind, MD. Electronically Signed: Jabier Gauss, Medical Scribe. 02/03/17. 10:42 AM.  I personally performed the services described in this documentation, which was SCRIBED in my presence. The recorded information has been reviewed and considered accurate. It has been edited as necessary during review. Jonnie Kind, MD

## 2017-03-08 ENCOUNTER — Ambulatory Visit: Payer: Medicare Other | Admitting: Obstetrics and Gynecology

## 2017-07-21 DIAGNOSIS — D518 Other vitamin B12 deficiency anemias: Secondary | ICD-10-CM | POA: Diagnosis not present

## 2017-07-21 DIAGNOSIS — I10 Essential (primary) hypertension: Secondary | ICD-10-CM | POA: Diagnosis not present

## 2017-07-21 DIAGNOSIS — E559 Vitamin D deficiency, unspecified: Secondary | ICD-10-CM | POA: Diagnosis not present

## 2017-07-21 DIAGNOSIS — E039 Hypothyroidism, unspecified: Secondary | ICD-10-CM | POA: Diagnosis not present

## 2017-07-21 DIAGNOSIS — R5381 Other malaise: Secondary | ICD-10-CM | POA: Diagnosis not present

## 2017-07-21 DIAGNOSIS — R7301 Impaired fasting glucose: Secondary | ICD-10-CM | POA: Diagnosis not present

## 2017-07-24 DIAGNOSIS — E039 Hypothyroidism, unspecified: Secondary | ICD-10-CM | POA: Diagnosis not present

## 2017-08-17 DIAGNOSIS — M1990 Unspecified osteoarthritis, unspecified site: Secondary | ICD-10-CM | POA: Diagnosis not present

## 2017-08-17 DIAGNOSIS — Z853 Personal history of malignant neoplasm of breast: Secondary | ICD-10-CM | POA: Diagnosis not present

## 2017-08-17 DIAGNOSIS — Z8249 Family history of ischemic heart disease and other diseases of the circulatory system: Secondary | ICD-10-CM | POA: Diagnosis not present

## 2017-08-17 DIAGNOSIS — K219 Gastro-esophageal reflux disease without esophagitis: Secondary | ICD-10-CM | POA: Diagnosis not present

## 2017-08-17 DIAGNOSIS — I10 Essential (primary) hypertension: Secondary | ICD-10-CM | POA: Diagnosis not present

## 2017-08-17 DIAGNOSIS — Z79899 Other long term (current) drug therapy: Secondary | ICD-10-CM | POA: Diagnosis not present

## 2017-08-17 DIAGNOSIS — B349 Viral infection, unspecified: Secondary | ICD-10-CM | POA: Diagnosis not present

## 2017-08-17 DIAGNOSIS — E039 Hypothyroidism, unspecified: Secondary | ICD-10-CM | POA: Diagnosis not present

## 2017-08-17 DIAGNOSIS — R05 Cough: Secondary | ICD-10-CM | POA: Diagnosis not present

## 2017-08-17 DIAGNOSIS — J069 Acute upper respiratory infection, unspecified: Secondary | ICD-10-CM | POA: Diagnosis not present

## 2018-01-19 DIAGNOSIS — E039 Hypothyroidism, unspecified: Secondary | ICD-10-CM | POA: Diagnosis not present

## 2018-01-19 DIAGNOSIS — R7301 Impaired fasting glucose: Secondary | ICD-10-CM | POA: Diagnosis not present

## 2018-01-19 DIAGNOSIS — G5691 Unspecified mononeuropathy of right upper limb: Secondary | ICD-10-CM | POA: Diagnosis not present

## 2018-01-19 DIAGNOSIS — I10 Essential (primary) hypertension: Secondary | ICD-10-CM | POA: Diagnosis not present

## 2018-01-22 DIAGNOSIS — R7303 Prediabetes: Secondary | ICD-10-CM | POA: Diagnosis not present

## 2018-01-22 DIAGNOSIS — E039 Hypothyroidism, unspecified: Secondary | ICD-10-CM | POA: Diagnosis not present

## 2018-01-22 DIAGNOSIS — I10 Essential (primary) hypertension: Secondary | ICD-10-CM | POA: Diagnosis not present

## 2018-01-22 DIAGNOSIS — E876 Hypokalemia: Secondary | ICD-10-CM | POA: Diagnosis not present

## 2018-04-18 ENCOUNTER — Other Ambulatory Visit: Payer: Self-pay

## 2018-04-18 NOTE — Patient Outreach (Signed)
[facility]) Care Management  04/18/2018  Karen [name] [birth year] [id number]   Medication Adherence call to Karen [name] left a message for patient to call back patient is due on Lisinopril/ HCTZ 20/12.5 mg.Karen [name] is showing past due under [place].   [facility] Management Direct Dial [phone]  Fax [phone] [name]

## 2018-05-22 DIAGNOSIS — I10 Essential (primary) hypertension: Secondary | ICD-10-CM | POA: Diagnosis not present

## 2018-05-22 DIAGNOSIS — Z853 Personal history of malignant neoplasm of breast: Secondary | ICD-10-CM | POA: Diagnosis not present

## 2018-06-15 DIAGNOSIS — I10 Essential (primary) hypertension: Secondary | ICD-10-CM | POA: Diagnosis not present

## 2018-06-15 DIAGNOSIS — Z853 Personal history of malignant neoplasm of breast: Secondary | ICD-10-CM | POA: Diagnosis not present

## 2018-08-07 DIAGNOSIS — R7301 Impaired fasting glucose: Secondary | ICD-10-CM | POA: Diagnosis not present

## 2018-08-07 DIAGNOSIS — I10 Essential (primary) hypertension: Secondary | ICD-10-CM | POA: Diagnosis not present

## 2018-08-07 DIAGNOSIS — E039 Hypothyroidism, unspecified: Secondary | ICD-10-CM | POA: Diagnosis not present

## 2018-08-13 DIAGNOSIS — I10 Essential (primary) hypertension: Secondary | ICD-10-CM | POA: Diagnosis not present

## 2018-08-13 DIAGNOSIS — E039 Hypothyroidism, unspecified: Secondary | ICD-10-CM | POA: Diagnosis not present

## 2018-08-13 DIAGNOSIS — R7303 Prediabetes: Secondary | ICD-10-CM | POA: Diagnosis not present

## 2018-09-14 DIAGNOSIS — R7301 Impaired fasting glucose: Secondary | ICD-10-CM | POA: Diagnosis not present

## 2018-09-14 DIAGNOSIS — E039 Hypothyroidism, unspecified: Secondary | ICD-10-CM | POA: Diagnosis not present

## 2018-09-14 DIAGNOSIS — I10 Essential (primary) hypertension: Secondary | ICD-10-CM | POA: Diagnosis not present

## 2018-10-12 DIAGNOSIS — E039 Hypothyroidism, unspecified: Secondary | ICD-10-CM | POA: Diagnosis not present

## 2018-10-12 DIAGNOSIS — I10 Essential (primary) hypertension: Secondary | ICD-10-CM | POA: Diagnosis not present

## 2018-10-12 DIAGNOSIS — R7301 Impaired fasting glucose: Secondary | ICD-10-CM | POA: Diagnosis not present

## 2018-10-16 DIAGNOSIS — Z0000 Encounter for general adult medical examination without abnormal findings: Secondary | ICD-10-CM | POA: Diagnosis not present

## 2018-11-12 DIAGNOSIS — E039 Hypothyroidism, unspecified: Secondary | ICD-10-CM | POA: Diagnosis not present

## 2018-11-12 DIAGNOSIS — C50919 Malignant neoplasm of unspecified site of unspecified female breast: Secondary | ICD-10-CM | POA: Diagnosis not present

## 2018-11-12 DIAGNOSIS — I10 Essential (primary) hypertension: Secondary | ICD-10-CM | POA: Diagnosis not present

## 2018-12-12 DIAGNOSIS — E039 Hypothyroidism, unspecified: Secondary | ICD-10-CM | POA: Diagnosis not present

## 2018-12-12 DIAGNOSIS — I10 Essential (primary) hypertension: Secondary | ICD-10-CM | POA: Diagnosis not present

## 2018-12-12 DIAGNOSIS — C50919 Malignant neoplasm of unspecified site of unspecified female breast: Secondary | ICD-10-CM | POA: Diagnosis not present

## 2018-12-28 DIAGNOSIS — E039 Hypothyroidism, unspecified: Secondary | ICD-10-CM | POA: Diagnosis not present

## 2018-12-28 DIAGNOSIS — R7303 Prediabetes: Secondary | ICD-10-CM | POA: Diagnosis not present

## 2018-12-28 DIAGNOSIS — E876 Hypokalemia: Secondary | ICD-10-CM | POA: Diagnosis not present

## 2018-12-28 DIAGNOSIS — R7301 Impaired fasting glucose: Secondary | ICD-10-CM | POA: Diagnosis not present

## 2018-12-28 DIAGNOSIS — I10 Essential (primary) hypertension: Secondary | ICD-10-CM | POA: Diagnosis not present

## 2019-02-05 DIAGNOSIS — I10 Essential (primary) hypertension: Secondary | ICD-10-CM | POA: Diagnosis not present

## 2019-02-05 DIAGNOSIS — C50919 Malignant neoplasm of unspecified site of unspecified female breast: Secondary | ICD-10-CM | POA: Diagnosis not present

## 2019-02-05 DIAGNOSIS — E039 Hypothyroidism, unspecified: Secondary | ICD-10-CM | POA: Diagnosis not present

## 2019-02-19 DIAGNOSIS — C50919 Malignant neoplasm of unspecified site of unspecified female breast: Secondary | ICD-10-CM | POA: Diagnosis not present

## 2019-02-19 DIAGNOSIS — E039 Hypothyroidism, unspecified: Secondary | ICD-10-CM | POA: Diagnosis not present

## 2019-02-19 DIAGNOSIS — I10 Essential (primary) hypertension: Secondary | ICD-10-CM | POA: Diagnosis not present

## 2019-02-28 DIAGNOSIS — R6889 Other general symptoms and signs: Secondary | ICD-10-CM | POA: Diagnosis not present

## 2019-03-18 DIAGNOSIS — E039 Hypothyroidism, unspecified: Secondary | ICD-10-CM | POA: Diagnosis not present

## 2019-03-18 DIAGNOSIS — I10 Essential (primary) hypertension: Secondary | ICD-10-CM | POA: Diagnosis not present

## 2019-03-18 DIAGNOSIS — C50919 Malignant neoplasm of unspecified site of unspecified female breast: Secondary | ICD-10-CM | POA: Diagnosis not present

## 2019-04-26 DIAGNOSIS — I10 Essential (primary) hypertension: Secondary | ICD-10-CM | POA: Diagnosis not present

## 2019-05-20 ENCOUNTER — Other Ambulatory Visit: Payer: Self-pay

## 2019-05-20 NOTE — Patient Outreach (Signed)
[facility]) Care Management  05/20/2019  Karen [name] July 22, 1945 [id number]   Medication Adherence call to [name] [place] Identifiers Verify spoke with patient she is past due on Olmesartan/Hctz 40/25 mg,patient explain she takes 1 tablet daily,patient ask to call pharmacy to order this medication and levothyroxine patient ask to have them deliver,pharmacy will deliver today.[name] is showing past due under [place].   [facility] Management Direct Dial [phone]  Fax [phone] [email or web address]

## 2019-05-21 DIAGNOSIS — I10 Essential (primary) hypertension: Secondary | ICD-10-CM | POA: Diagnosis not present

## 2019-05-21 DIAGNOSIS — C50919 Malignant neoplasm of unspecified site of unspecified female breast: Secondary | ICD-10-CM | POA: Diagnosis not present

## 2019-06-11 ENCOUNTER — Other Ambulatory Visit: Payer: Self-pay | Admitting: Internal Medicine

## 2019-06-11 DIAGNOSIS — L989 Disorder of the skin and subcutaneous tissue, unspecified: Secondary | ICD-10-CM | POA: Diagnosis not present

## 2019-06-11 DIAGNOSIS — Z853 Personal history of malignant neoplasm of breast: Secondary | ICD-10-CM | POA: Diagnosis not present

## 2019-06-11 DIAGNOSIS — N644 Mastodynia: Secondary | ICD-10-CM | POA: Diagnosis not present

## 2019-06-18 ENCOUNTER — Other Ambulatory Visit: Payer: Medicare Other

## 2019-06-19 ENCOUNTER — Ambulatory Visit
Admission: RE | Admit: 2019-06-19 | Discharge: 2019-06-19 | Disposition: A | Discharge status: Home / Self Care | Payer: Medicare Other | Source: Ambulatory Visit | Attending: Internal Medicine | Admitting: Internal Medicine

## 2019-06-19 ENCOUNTER — Other Ambulatory Visit: Payer: Self-pay

## 2019-06-19 DIAGNOSIS — N644 Mastodynia: Secondary | ICD-10-CM

## 2019-06-19 DIAGNOSIS — L989 Disorder of the skin and subcutaneous tissue, unspecified: Secondary | ICD-10-CM

## 2019-06-19 DIAGNOSIS — Z853 Personal history of malignant neoplasm of breast: Secondary | ICD-10-CM

## 2019-06-19 DIAGNOSIS — T8542XA Displacement of breast prosthesis and implant, initial encounter: Secondary | ICD-10-CM | POA: Diagnosis not present

## 2019-06-20 DIAGNOSIS — C50919 Malignant neoplasm of unspecified site of unspecified female breast: Secondary | ICD-10-CM | POA: Diagnosis not present

## 2019-06-20 DIAGNOSIS — I10 Essential (primary) hypertension: Secondary | ICD-10-CM | POA: Diagnosis not present

## 2019-07-02 DIAGNOSIS — Z471 Aftercare following joint replacement surgery: Secondary | ICD-10-CM | POA: Diagnosis not present

## 2019-07-02 DIAGNOSIS — Z96641 Presence of right artificial hip joint: Secondary | ICD-10-CM | POA: Diagnosis not present

## 2019-07-02 DIAGNOSIS — M1612 Unilateral primary osteoarthritis, left hip: Secondary | ICD-10-CM | POA: Diagnosis not present

## 2019-07-04 DIAGNOSIS — R7301 Impaired fasting glucose: Secondary | ICD-10-CM | POA: Diagnosis not present

## 2019-07-04 DIAGNOSIS — I10 Essential (primary) hypertension: Secondary | ICD-10-CM | POA: Diagnosis not present

## 2019-07-04 DIAGNOSIS — E039 Hypothyroidism, unspecified: Secondary | ICD-10-CM | POA: Diagnosis not present

## 2019-07-04 DIAGNOSIS — R7303 Prediabetes: Secondary | ICD-10-CM | POA: Diagnosis not present

## 2019-07-08 DIAGNOSIS — E039 Hypothyroidism, unspecified: Secondary | ICD-10-CM | POA: Diagnosis not present

## 2019-07-08 DIAGNOSIS — M25552 Pain in left hip: Secondary | ICD-10-CM | POA: Diagnosis not present

## 2019-07-08 DIAGNOSIS — I10 Essential (primary) hypertension: Secondary | ICD-10-CM | POA: Diagnosis not present

## 2019-07-08 DIAGNOSIS — R7303 Prediabetes: Secondary | ICD-10-CM | POA: Diagnosis not present

## 2019-07-16 ENCOUNTER — Ambulatory Visit: Payer: Self-pay | Admitting: Orthopedic Surgery

## 2019-07-16 NOTE — Patient Instructions (Addendum)
DUE TO COVID-19 ONLY ONE VISITOR IS ALLOWED TO COME WITH YOU AND STAY IN THE WAITING ROOM ONLY DURING PRE OP AND PROCEDURE DAY OF SURGERY. THE 1 VISITOR MAY VISIT WITH YOU AFTER SURGERY IN YOUR PRIVATE ROOM DURING VISITING HOURS ONLY!  YOU NEED TO HAVE A COVID 19 TEST ON 07/22/2019 @8 :50 AM, THIS TEST MUST BE DONE BEFORE SURGERY, COME  Karen [name], [place] , [id number].  ([place]) ONCE YOUR COVID TEST IS COMPLETED, PLEASE BEGIN THE QUARANTINE INSTRUCTIONS AS OUTLINED IN YOUR HANDOUT.                Karen [name]   Your procedure is scheduled on: 07/25/2019   Report to [facility] Main  Entrance    Report to [place] at 5:30AM     Call this number if you have problems the morning of surgery [phone]    Remember: NO SOLID FOOD AFTER MIDNIGHT THE NIGHT PRIOR TO SURGERY. YOU MAY DRINK CLEAR LIQUIDS.   STOP CLEAR LIQUIDS AT ____4:30AM_____ AND THEN DRINK THE ENSURE PRE-SURGERY [place]. NOTHING BY MOUTH AFTER THE ENSURE DRINK!    CLEAR LIQUID DIET   Foods Allowed                                                                     Foods Excluded  Coffee and tea, regular and decaf                             liquids that you cannot  Plain Jell-O any favor except red or purple                                           see through such as: Fruit ices (not with fruit pulp)                                     milk, soups, orange juice  Iced Popsicles                                    All solid food Carbonated beverages, regular and diet                                    Cranberry, grape and apple juices Sports drinks like Gatorade Lightly seasoned clear broth or consume(fat free) Sugar, honey syrup  Sample Menu Breakfast                                Lunch                                     Supper Cranberry juice  Beef broth                            Chicken broth Jell-O                                     Grape juice                            Apple juice Coffee or tea                        Jell-O                                      Popsicle                                                Coffee or tea                        Coffee or tea  _____________________________________________________________________   BRUSH YOUR TEETH MORNING OF SURGERY AND RINSE YOUR MOUTH OUT, NO CHEWING GUM CANDY OR MINTS.     Take these medicines the morning of surgery with A SIP OF WATER: LEVOTHYROXINE, XANAX IF NEEDED                                  You may not have any metal on your body including hair pins and              piercings  Do not wear jewelry, make-up, lotions, powders or perfumes, deodorant             Do not wear nail polish on your fingernails.  Do not shave  48 hours prior to surgery.     Do not bring valuables to the hospital. [place].  Contacts, dentures or bridgework may not be worn into surgery.  Leave suitcase in the car. After surgery it may be brought to your room.     Special Instructions: N/A              Please read over the following fact sheets you were given: _____________________________________________________________________             [facility] - Preparing for Surgery Before surgery, you can play an important role.  Because skin is not sterile, your skin needs to be as free of germs as possible.  You can reduce the number of germs on your skin by washing with CHG (chlorahexidine gluconate) soap before surgery.  CHG is an antiseptic cleaner which kills germs and bonds with the skin to continue killing germs even after washing. Please DO NOT use if you have an allergy to CHG or antibacterial soaps.  If your skin becomes reddened/irritated stop using the CHG and inform your nurse when you arrive at Short Stay. Do not shave (including legs and underarms) for at least 48  hours prior to the first CHG shower.  You may shave your face/neck. Please  follow these instructions carefully:  1.  Shower with CHG Soap the night before surgery and the  morning of Surgery.  2.  If you choose to wash your hair, wash your hair first as usual with your  normal  shampoo.  3.  After you shampoo, rinse your hair and body thoroughly to remove the  shampoo.                           4.  Use CHG as you would any other liquid soap.  You can apply chg directly  to the skin and wash                       Gently with a scrungie or clean washcloth.  5.  Apply the CHG Soap to your body ONLY FROM THE NECK DOWN.   Do not use on face/ open                           Wound or open sores. Avoid contact with eyes, ears mouth and genitals (private parts).                       Wash face,  Genitals (private parts) with your normal soap.             6.  Wash thoroughly, paying special attention to the area where your surgery  will be performed.  7.  Thoroughly rinse your body with warm water from the neck down.  8.  DO NOT shower/wash with your normal soap after using and rinsing off  the CHG Soap.                9.  Pat yourself dry with a clean towel.            10.  Wear clean pajamas.            11.  Place clean sheets on your bed the night of your first shower and do not  sleep with pets. Day of Surgery : Do not apply any lotions/deodorants the morning of surgery.  Please wear clean clothes to the hospital/surgery center.  FAILURE TO FOLLOW THESE INSTRUCTIONS MAY RESULT IN THE CANCELLATION OF YOUR SURGERY PATIENT SIGNATURE_________________________________  NURSE SIGNATURE__________________________________  ________________________________________________________________________   Karen [name]  An incentive spirometer is a tool that can help keep your lungs clear and active. This tool measures how well you are filling your lungs with each breath. Taking long deep breaths may help reverse or decrease the chance of developing breathing (pulmonary) problems  (especially infection) following:  A long period of time when you are unable to move or be active. BEFORE THE PROCEDURE   If the spirometer includes an indicator to show your best effort, your nurse or respiratory therapist will set it to a desired goal.  If possible, sit up straight or lean slightly forward. Try not to slouch.  Hold the incentive spirometer in an upright position. INSTRUCTIONS FOR USE  1. Sit on the edge of your bed if possible, or sit up as far as you can in bed or on a chair. 2. Hold the incentive spirometer in an upright position. 3. Breathe out normally. 4. Place the mouthpiece in your mouth and seal your lips tightly around it. 5.  Breathe in slowly and as deeply as possible, raising the piston or the ball toward the top of the column. 6. Hold your breath for 3-5 seconds or for as long as possible. Allow the piston or ball to fall to the bottom of the column. 7. Remove the mouthpiece from your mouth and breathe out normally. 8. Rest for a few seconds and repeat Steps 1 through 7 at least 10 times every 1-2 hours when you are awake. Take your time and take a few normal breaths between deep breaths. 9. The spirometer may include an indicator to show your best effort. Use the indicator as a goal to work toward during each repetition. 10. After each set of 10 deep breaths, practice coughing to be sure your lungs are clear. If you have an incision (the cut made at the time of surgery), support your incision when coughing by placing a pillow or rolled up towels firmly against it. Once you are able to get out of bed, walk around indoors and cough well. You may stop using the incentive spirometer when instructed by your caregiver.  RISKS AND COMPLICATIONS  Take your time so you do not get dizzy or light-headed.  If you are in pain, you may need to take or ask for pain medication before doing incentive spirometry. It is harder to take a deep breath if you are having  pain. AFTER USE  Rest and breathe slowly and easily.  It can be helpful to keep track of a log of your progress. Your caregiver can provide you with a simple table to help with this. If you are using the spirometer at home, follow these instructions: [place] IF:   You are having difficultly using the spirometer.  You have trouble using the spirometer as often as instructed.  Your pain medication is not giving enough relief while using the spirometer.  You develop fever of 100.5 F (38.1 C) or higher. SEEK IMMEDIATE MEDICAL CARE IF:   You cough up bloody sputum that had not been present before.  You develop fever of 102 F (38.9 C) or greater.  You develop worsening pain at or near the incision site. MAKE SURE YOU:   Understand these instructions.  Will watch your condition.  Will get help right away if you are not doing well or get worse. Document Released: 10/10/2006 Document Revised: 08/22/2011 Document Reviewed: 12/11/2006 [employer] Patient Information 2014 [place], [place].   ________________________________________________________________________

## 2019-07-17 ENCOUNTER — Encounter (HOSPITAL_COMMUNITY): Payer: Self-pay

## 2019-07-17 ENCOUNTER — Other Ambulatory Visit: Payer: Self-pay

## 2019-07-17 ENCOUNTER — Encounter (HOSPITAL_COMMUNITY)
Admission: RE | Admit: 2019-07-17 | Discharge: 2019-07-17 | Disposition: A | Discharge status: Home / Self Care | Payer: Medicare Other | Source: Ambulatory Visit | Attending: Orthopedic Surgery | Admitting: Orthopedic Surgery

## 2019-07-17 DIAGNOSIS — Z01812 Encounter for preprocedural laboratory examination: Secondary | ICD-10-CM | POA: Insufficient documentation

## 2019-07-17 LAB — COMPREHENSIVE METABOLIC PANEL
ALT: 16 U/L (ref 0–44)
AST: 20 U/L (ref 15–41)
Albumin: 3.8 g/dL (ref 3.5–5.0)
Alkaline Phosphatase: 102 U/L (ref 38–126)
Anion gap: 7 (ref 5–15)
BUN: 20 mg/dL (ref 8–23)
CO2: 31 mmol/L (ref 22–32)
Calcium: 9.3 mg/dL (ref 8.9–10.3)
Chloride: 104 mmol/L (ref 98–111)
Creatinine, Ser: 0.82 mg/dL (ref 0.44–1.00)
GFR calc Af Amer: >60 mL/min (ref >60)
GFR calc non Af Amer: >60 mL/min (ref >60)
Glucose, Bld: 97 mg/dL (ref 70–99)
Potassium: 4.3 mmol/L (ref 3.5–5.1)
Sodium: 142 mmol/L (ref 135–145)
Total Bilirubin: 0.6 mg/dL (ref 0.3–1.2)
Total Protein: 7.6 g/dL (ref 6.5–8.1)

## 2019-07-17 LAB — URINALYSIS, ROUTINE W REFLEX MICROSCOPIC
Bilirubin Urine: NEGATIVE
Glucose, UA: NEGATIVE mg/dL
Hgb urine dipstick: NEGATIVE
Ketones, ur: 5 mg/dL — AB
Nitrite: NEGATIVE
Protein, ur: NEGATIVE mg/dL
Specific Gravity, Urine: 1.027 (ref 1.005–1.030)
pH: 5 (ref 5.0–8.0)

## 2019-07-17 LAB — CBC
HCT: 41 % (ref 36.0–46.0)
Hemoglobin: 12.9 g/dL (ref 12.0–15.0)
MCH: 31 pg (ref 26.0–34.0)
MCHC: 31.5 g/dL (ref 30.0–36.0)
MCV: 98.6 fL (ref 80.0–100.0)
Platelets: 242 10*3/uL (ref 150–400)
RBC: 4.16 MIL/uL (ref 3.87–5.11)
RDW: 13.6 % (ref 11.5–15.5)
WBC: 7.5 10*3/uL (ref 4.0–10.5)
nRBC: 0 % (ref 0.0–0.2)

## 2019-07-17 LAB — SURGICAL PCR SCREEN
MRSA, PCR: NEGATIVE
Staphylococcus aureus: NEGATIVE

## 2019-07-17 LAB — PROTIME-INR
INR: 0.9 (ref 0.8–1.2)
Prothrombin Time: 12.3 seconds (ref 11.4–15.2)

## 2019-07-17 NOTE — Progress Notes (Signed)
Patient is a blood refusal. Surgical consent and blood refusal to be signed day of surgery. NO BLOOD PRODUCTS order signed and held;  to be released day of surgery .  FYI

## 2019-07-17 NOTE — Progress Notes (Signed)
PCP - Celene Squibb, MD Cardiologist -   Chest x-ray -  EKG - obtained today 07/17/2019 epic  Stress Test -  ECHO -  Cardiac Cath -   Sleep Study -  CPAP -   Fasting Blood Sugar -  Checks Blood Sugar _____ times a day  Blood Thinner Instructions: Aspirin Instructions:  Patient has not been advised on aspirin at this time.  Last Dose:  Anesthesia review:   Patient denies shortness of breath, fever, cough and chest pain at PAT appointment   Patient verbalized understanding of instructions that were given to them at the PAT appointment. Patient was also instructed that they will need to review over the PAT instructions again at home before surgery.

## 2019-07-22 ENCOUNTER — Ambulatory Visit: Payer: Self-pay | Admitting: Orthopedic Surgery

## 2019-07-22 ENCOUNTER — Other Ambulatory Visit (HOSPITAL_COMMUNITY)
Admission: RE | Admit: 2019-07-22 | Discharge: 2019-07-22 | Disposition: A | Discharge status: Home / Self Care | Payer: Medicare Other | Source: Ambulatory Visit | Attending: Orthopedic Surgery | Admitting: Orthopedic Surgery

## 2019-07-22 DIAGNOSIS — Z20822 Contact with and (suspected) exposure to covid-19: Secondary | ICD-10-CM | POA: Insufficient documentation

## 2019-07-22 DIAGNOSIS — Z01812 Encounter for preprocedural laboratory examination: Secondary | ICD-10-CM | POA: Insufficient documentation

## 2019-07-22 LAB — SARS CORONAVIRUS 2 (TAT 6-24 HRS): SARS Coronavirus 2: NEGATIVE

## 2019-07-22 NOTE — H&P (Signed)
TOTAL HIP ADMISSION H&P  Patient is admitted for left total hip arthroplasty.  Subjective:  Chief Complaint: left hip pain  HPI: Karen [name], 74 y.o. female, has a history of pain and functional disability in the left hip(s) due to arthritis and patient has failed non-surgical conservative treatments for greater than 12 weeks to include NSAID's and/or analgesics, flexibility and strengthening excercises, supervised PT with diminished ADL's post treatment, use of assistive devices, weight reduction as appropriate and activity modification.  Onset of symptoms was gradual starting >10 years ago with rapidlly worsening course since that time.The patient noted no past surgery on the left hip(s).  Patient currently rates pain in the left hip at 10 out of 10 with activity. Patient has night pain, worsening of pain with activity and weight bearing, trendelenberg gait, pain that interfers with activities of daily living and pain with passive range of motion. Patient has evidence of subchondral cysts, subchondral sclerosis, periarticular osteophytes and joint space narrowing by imaging studies. This condition presents safety issues increasing the risk of falls. There is no current active infection.  Patient Active Problem List   Diagnosis Date Noted  . History of total right hip arthroplasty 03/01/2016  . Hypoxemia 03/01/2016  . Symptomatic anemia 03/01/2016  . Hypokalemia 03/01/2016  . Primary osteoarthritis of right hip 02/29/2016  . Wound infection 12/17/2014  . Wound infection after surgery 11/07/2014  . Spinal stenosis 09/25/2014  . Hematochezia 03/19/2013  . LLQ pain 01/24/2011  . Umbilical hernia [id number]  . Rectal bleed 01/24/2011  . GERD (gastroesophageal reflux disease) 01/24/2011  . Dysphagia 01/24/2011  . DIVERTICULITIS-COLON 08/30/2010   Past Medical History:  Diagnosis Date  . Arthritis    right hip  . Breast cancer [facility]) 2004   bilateral mastectomy-all left lymph nodes  removed; chemo. Patient states she had "spot" on the right breast as well.  . Diverticulitis   . GERD (gastroesophageal reflux disease)    since gallbladder removed, no more GERD  . Goiter   . HTN (hypertension)   . Hypothyroidism   . Mental disorder    Major depressive disorder; generalized anxiety disorder; mood disorder  . Pneumonia   . Sleep apnea    has never been tested.  was dx with cancer first and had this problem on the back burner  . Tubular adenoma     Past Surgical History:  Procedure Laterality Date  . ABDOMINAL HYSTERECTOMY    . bilateral mastectomy w/reconstruction  2004  . BREAST SURGERY    . CHOLECYSTECTOMY    . COLONOSCOPY  2008   colon with few diverticula in sigmoid colon  . COLONOSCOPY  02/16/2011   Dr. Gala Romney- colon and rectal polyps= tubular adenoma, L sided diverticulosis.  . DEBRIDEMENT AND CLOSURE WOUND N/A 12/17/2014   Procedure: CLOSURE WOUND LUMBAR SPINE;  Surgeon: Melina Schools, MD;  Location: [place];  Service: Orthopedics;  Laterality: N/A;  . DILATION AND CURETTAGE OF UTERUS    . ESOPHAGOGASTRODUODENOSCOPY  2008   lipoma distal esophagus, gastritis   . ESOPHAGOGASTRODUODENOSCOPY  02/16/2011   Dr. Gala Romney- normal esophagus, small hiatal hernia o/w normal stomach. normal first and second portion of the duodenum  . HERNIA REPAIR     umbilical hernia was repaired  . INJECTION KNEE Right 11/07/2014   Procedure: KNEE INJECTION;  Surgeon: Melina Schools, MD;  Location: [place];  Service: Orthopedics;  Laterality: Right;  . LUMBAR LAMINECTOMY/DECOMPRESSION MICRODISCECTOMY N/A 09/25/2014   Procedure: DECOMPRESSION L3-L5 WITH INSERTION OF COFLEX   (  2 LEVELS);  Surgeon: Melina Schools, MD;  Location: [place];  Service: Orthopedics;  Laterality: N/A;  . LUMBAR LAMINECTOMY/DECOMPRESSION MICRODISCECTOMY N/A 11/07/2014   Procedure: IRRIGATION AND DEBRIDEMENT BACK WOUND, PLACEMENT OF ANTIBIOTIC BEADS;  Surgeon: Melina Schools, MD;  Location: [place];  Service: Orthopedics;   Laterality: N/A;  . LUMBAR WOUND DEBRIDEMENT N/A 12/01/2014   Procedure: IRRIGATION AND DEBRIDEMENT OF LUMBAR WOUND WITH VAC PLACEMENT;  Surgeon: Melina Schools, MD;  Location: [place];  Service: Orthopedics;  Laterality: N/A;  . LUMBAR WOUND DEBRIDEMENT N/A 12/03/2014   Procedure: WOUND VAC DRESSING CHANGE ;  Surgeon: Melina Schools, MD;  Location: [place];  Service: Orthopedics;  Laterality: N/A;  Venia Minks DILATION  02/16/2011   Procedure: Venia Minks DILATION;  Surgeon: Daneil Dolin, MD;  [facility];  Service: Endoscopy;  Laterality: N/A;  . MASTECTOMY     bilateral 2004 left side had 17 nodes removed  . PARTIAL HYSTERECTOMY    . SAVORY DILATION  02/16/2011   Procedure: SAVORY DILATION;  Surgeon: Daneil Dolin, MD;  [facility];  Service: Endoscopy;  Laterality: N/A;  . TOTAL HIP ARTHROPLASTY Right 02/29/2016   Procedure: TOTAL HIP ARTHROPLASTY ANTERIOR APPROACH;  Surgeon: Rod Can, MD;  Location: [place];  Service: Orthopedics;  Laterality: Right;    Current Outpatient Medications  Medication Sig Dispense Refill Last Dose  . acetaminophen (TYLENOL) 500 MG tablet Take 500-1,000 mg by mouth every 6 (six) hours as needed (for pain.).     Marland Kitchen ALPRAZolam (XANAX) 1 MG tablet Take 1 tablet (1 mg total) by mouth 3 (three) times daily as needed for anxiety. 90 tablet 5   . aspirin EC 81 MG tablet Take 81 mg by mouth daily.      Marland Kitchen ibuprofen (ADVIL) 200 MG tablet Take 400 mg by mouth every 6 (six) hours as needed (hip pain.).     Marland Kitchen levothyroxine (SYNTHROID, LEVOTHROID) 100 MCG tablet Take 100 mcg by mouth daily before breakfast.   0   . olmesartan-hydrochlorothiazide (BENICAR HCT) 40-25 MG tablet Take 1 tablet by mouth daily.      No current facility-administered medications for this visit.   Allergies  Allergen Reactions  . Sulfonamide Derivatives Anaphylaxis  . Morphine And Related Swelling    SWELLING REACTION UNSPECIFIED     Social History   Tobacco Use  . Smoking  status: Former Smoker    Packs/day: 1.00    Years: 20.00    Pack years: 20.00    Quit date: 09/24/1995    Years since quitting: 23.8  . Smokeless tobacco: Never Used  . Tobacco comment: Quit 1997  Substance Use Topics  . Alcohol use: No    Family History  Problem Relation Age of Onset  . Pancreatic cancer Mother        deceased  . Aneurysm Father        deceased, aortic  . Breast cancer Sister        deceased 8 years after initial diagnosis  . Breast cancer Sister        survivor  . Diverticulitis Brother        s/p partial colon resection  . Suicidality Brother   . Cancer Son        lung  . Colon cancer Neg Hx      Review of Systems  Constitutional: Negative.   HENT: Negative.   Eyes: Negative.   Respiratory: Negative.   Cardiovascular: Negative.   Gastrointestinal: Negative.  Endocrine: Negative.   Genitourinary: Negative.   Musculoskeletal: Positive for arthralgias and back pain.  Skin: Negative.   Allergic/Immunologic: Negative.   Neurological: Negative.   Hematological: Negative.   Psychiatric/Behavioral: Negative.     Objective:  Physical Exam  Vital signs in last 24 hours: @VSRANGES @  Labs:   Estimated body mass index is 35.76 kg/m as calculated from the following:   Height as of 07/17/19: 5\' 2"  (1.575 m).   Weight as of 07/17/19: 88.7 kg.   Imaging Review Plain radiographs demonstrate severe degenerative joint disease of the left hip(s). The bone quality appears to be adequate for age and reported activity level.      Assessment/Plan:  End stage arthritis, left hip(s)  The patient history, physical examination, clinical judgement of the provider and imaging studies are consistent with end stage degenerative joint disease of the left hip(s) and total hip arthroplasty is deemed medically necessary. The treatment options including medical management, injection therapy, arthroscopy and arthroplasty were discussed at length. The risks and  benefits of total hip arthroplasty were presented and reviewed. The risks due to aseptic loosening, infection, stiffness, dislocation/subluxation,  thromboembolic complications and other imponderables were discussed.  The patient acknowledged the explanation, agreed to proceed with the plan and consent was signed. Patient is being admitted for inpatient treatment for surgery, pain control, PT, OT, prophylactic antibiotics, VTE prophylaxis, progressive ambulation and ADL's and discharge planning.The patient is planning to be discharged home with HEP    Patient's anticipated LOS is less than 2 midnights, meeting these requirements: - Younger than 19 - Lives within 1 hour of care - Has a competent adult at home to recover with post-op recover - NO history of  - Chronic pain requiring opiods  - Diabetes  - Coronary Artery Disease  - Heart failure  - Heart attack  - Stroke  - DVT/VTE  - Cardiac arrhythmia  - Respiratory Failure/COPD  - Renal failure  - Anemia  - Advanced Liver disease

## 2019-07-22 NOTE — H&P (View-Only) (Signed)
TOTAL HIP ADMISSION H&P  Patient is admitted for left total hip arthroplasty.  Subjective:  Chief Complaint: left hip pain  HPI: Karen [name], 74 y.o. female, has a history of pain and functional disability in the left hip(s) due to arthritis and patient has failed non-surgical conservative treatments for greater than 12 weeks to include NSAID's and/or analgesics, flexibility and strengthening excercises, supervised PT with diminished ADL's post treatment, use of assistive devices, weight reduction as appropriate and activity modification.  Onset of symptoms was gradual starting >10 years ago with rapidlly worsening course since that time.The patient noted no past surgery on the left hip(s).  Patient currently rates pain in the left hip at 10 out of 10 with activity. Patient has night pain, worsening of pain with activity and weight bearing, trendelenberg gait, pain that interfers with activities of daily living and pain with passive range of motion. Patient has evidence of subchondral cysts, subchondral sclerosis, periarticular osteophytes and joint space narrowing by imaging studies. This condition presents safety issues increasing the risk of falls. There is no current active infection.  Patient Active Problem List   Diagnosis Date Noted  . History of total right hip arthroplasty 03/01/2016  . Hypoxemia 03/01/2016  . Symptomatic anemia 03/01/2016  . Hypokalemia 03/01/2016  . Primary osteoarthritis of right hip 02/29/2016  . Wound infection 12/17/2014  . Wound infection after surgery 11/07/2014  . Spinal stenosis 09/25/2014  . Hematochezia 03/19/2013  . LLQ pain 01/24/2011  . Umbilical hernia [id number]  . Rectal bleed 01/24/2011  . GERD (gastroesophageal reflux disease) 01/24/2011  . Dysphagia 01/24/2011  . DIVERTICULITIS-COLON 08/30/2010   Past Medical History:  Diagnosis Date  . Arthritis    right hip  . Breast cancer [facility]) 2004   bilateral mastectomy-all left lymph nodes  removed; chemo. Patient states she had "spot" on the right breast as well.  . Diverticulitis   . GERD (gastroesophageal reflux disease)    since gallbladder removed, no more GERD  . Goiter   . HTN (hypertension)   . Hypothyroidism   . Mental disorder    Major depressive disorder; generalized anxiety disorder; mood disorder  . Pneumonia   . Sleep apnea    has never been tested.  was dx with cancer first and had this problem on the back burner  . Tubular adenoma     Past Surgical History:  Procedure Laterality Date  . ABDOMINAL HYSTERECTOMY    . bilateral mastectomy w/reconstruction  2004  . BREAST SURGERY    . CHOLECYSTECTOMY    . COLONOSCOPY  2008   colon with few diverticula in sigmoid colon  . COLONOSCOPY  02/16/2011   Dr. Gala Romney- colon and rectal polyps= tubular adenoma, L sided diverticulosis.  . DEBRIDEMENT AND CLOSURE WOUND N/A 12/17/2014   Procedure: CLOSURE WOUND LUMBAR SPINE;  Surgeon: Melina Schools, MD;  Location: [place];  Service: Orthopedics;  Laterality: N/A;  . DILATION AND CURETTAGE OF UTERUS    . ESOPHAGOGASTRODUODENOSCOPY  2008   lipoma distal esophagus, gastritis   . ESOPHAGOGASTRODUODENOSCOPY  02/16/2011   Dr. Gala Romney- normal esophagus, small hiatal hernia o/w normal stomach. normal first and second portion of the duodenum  . HERNIA REPAIR     umbilical hernia was repaired  . INJECTION KNEE Right 11/07/2014   Procedure: KNEE INJECTION;  Surgeon: Melina Schools, MD;  Location: [place];  Service: Orthopedics;  Laterality: Right;  . LUMBAR LAMINECTOMY/DECOMPRESSION MICRODISCECTOMY N/A 09/25/2014   Procedure: DECOMPRESSION L3-L5 WITH INSERTION OF COFLEX   (  2 LEVELS);  Surgeon: Melina Schools, MD;  Location: [place];  Service: Orthopedics;  Laterality: N/A;  . LUMBAR LAMINECTOMY/DECOMPRESSION MICRODISCECTOMY N/A 11/07/2014   Procedure: IRRIGATION AND DEBRIDEMENT BACK WOUND, PLACEMENT OF ANTIBIOTIC BEADS;  Surgeon: Melina Schools, MD;  Location: [place];  Service: Orthopedics;   Laterality: N/A;  . LUMBAR WOUND DEBRIDEMENT N/A 12/01/2014   Procedure: IRRIGATION AND DEBRIDEMENT OF LUMBAR WOUND WITH VAC PLACEMENT;  Surgeon: Melina Schools, MD;  Location: [place];  Service: Orthopedics;  Laterality: N/A;  . LUMBAR WOUND DEBRIDEMENT N/A 12/03/2014   Procedure: WOUND VAC DRESSING CHANGE ;  Surgeon: Melina Schools, MD;  Location: [place];  Service: Orthopedics;  Laterality: N/A;  Venia Minks DILATION  02/16/2011   Procedure: Venia Minks DILATION;  Surgeon: Daneil Dolin, MD;  [facility];  Service: Endoscopy;  Laterality: N/A;  . MASTECTOMY     bilateral 2004 left side had 17 nodes removed  . PARTIAL HYSTERECTOMY    . SAVORY DILATION  02/16/2011   Procedure: SAVORY DILATION;  Surgeon: Daneil Dolin, MD;  [facility];  Service: Endoscopy;  Laterality: N/A;  . TOTAL HIP ARTHROPLASTY Right 02/29/2016   Procedure: TOTAL HIP ARTHROPLASTY ANTERIOR APPROACH;  Surgeon: Rod Can, MD;  Location: [place];  Service: Orthopedics;  Laterality: Right;    Current Outpatient Medications  Medication Sig Dispense Refill Last Dose  . acetaminophen (TYLENOL) 500 MG tablet Take 500-1,000 mg by mouth every 6 (six) hours as needed (for pain.).     Marland Kitchen ALPRAZolam (XANAX) 1 MG tablet Take 1 tablet (1 mg total) by mouth 3 (three) times daily as needed for anxiety. 90 tablet 5   . aspirin EC 81 MG tablet Take 81 mg by mouth daily.      Marland Kitchen ibuprofen (ADVIL) 200 MG tablet Take 400 mg by mouth every 6 (six) hours as needed (hip pain.).     Marland Kitchen levothyroxine (SYNTHROID, LEVOTHROID) 100 MCG tablet Take 100 mcg by mouth daily before breakfast.   0   . olmesartan-hydrochlorothiazide (BENICAR HCT) 40-25 MG tablet Take 1 tablet by mouth daily.      No current facility-administered medications for this visit.   Allergies  Allergen Reactions  . Sulfonamide Derivatives Anaphylaxis  . Morphine And Related Swelling    SWELLING REACTION UNSPECIFIED     Social History   Tobacco Use  . Smoking  status: Former Smoker    Packs/day: 1.00    Years: 20.00    Pack years: 20.00    Quit date: 09/24/1995    Years since quitting: 23.8  . Smokeless tobacco: Never Used  . Tobacco comment: Quit 1997  Substance Use Topics  . Alcohol use: No    Family History  Problem Relation Age of Onset  . Pancreatic cancer Mother        deceased  . Aneurysm Father        deceased, aortic  . Breast cancer Sister        deceased 8 years after initial diagnosis  . Breast cancer Sister        survivor  . Diverticulitis Brother        s/p partial colon resection  . Suicidality Brother   . Cancer Son        lung  . Colon cancer Neg Hx      Review of Systems  Constitutional: Negative.   HENT: Negative.   Eyes: Negative.   Respiratory: Negative.   Cardiovascular: Negative.   Gastrointestinal: Negative.  Endocrine: Negative.   Genitourinary: Negative.   Musculoskeletal: Positive for arthralgias and back pain.  Skin: Negative.   Allergic/Immunologic: Negative.   Neurological: Negative.   Hematological: Negative.   Psychiatric/Behavioral: Negative.     Objective:  Physical Exam  Vital signs in last 24 hours: @VSRANGES @  Labs:   Estimated body mass index is 35.76 kg/m as calculated from the following:   Height as of 07/17/19: 5\' 2"  (1.575 m).   Weight as of 07/17/19: 88.7 kg.   Imaging Review Plain radiographs demonstrate severe degenerative joint disease of the left hip(s). The bone quality appears to be adequate for age and reported activity level.      Assessment/Plan:  End stage arthritis, left hip(s)  The patient history, physical examination, clinical judgement of the provider and imaging studies are consistent with end stage degenerative joint disease of the left hip(s) and total hip arthroplasty is deemed medically necessary. The treatment options including medical management, injection therapy, arthroscopy and arthroplasty were discussed at length. The risks and  benefits of total hip arthroplasty were presented and reviewed. The risks due to aseptic loosening, infection, stiffness, dislocation/subluxation,  thromboembolic complications and other imponderables were discussed.  The patient acknowledged the explanation, agreed to proceed with the plan and consent was signed. Patient is being admitted for inpatient treatment for surgery, pain control, PT, OT, prophylactic antibiotics, VTE prophylaxis, progressive ambulation and ADL's and discharge planning.The patient is planning to be discharged home with HEP    Patient's anticipated LOS is less than 2 midnights, meeting these requirements: - Younger than 49 - Lives within 1 hour of care - Has a competent adult at home to recover with post-op recover - NO history of  - Chronic pain requiring opiods  - Diabetes  - Coronary Artery Disease  - Heart failure  - Heart attack  - Stroke  - DVT/VTE  - Cardiac arrhythmia  - Respiratory Failure/COPD  - Renal failure  - Anemia  - Advanced Liver disease

## 2019-07-24 NOTE — Anesthesia Preprocedure Evaluation (Addendum)
Anesthesia Evaluation  Patient identified by MRN, date of birth, ID band Patient awake    Reviewed: Allergy & Precautions, NPO status , Patient's Chart, lab work & pertinent test results  History of Anesthesia Complications Negative for: history of anesthetic complications  Airway Mallampati: III  TM Distance: >3 FB Neck ROM: Full   Comment: Previous grade I view with MAC 3 Dental  (+) Missing, Dental Advisory Given, Poor Dentition,    Pulmonary neg shortness of breath, sleep apnea (symptoms suggestive (STOP-BANG 6/8)) , neg COPD, neg recent URI, former smoker,    Pulmonary exam normal breath sounds clear to auscultation       Cardiovascular hypertension, Pt. on medications (-) angina(-) Past MI, (-) Cardiac Stents and (-) CABG  Rhythm:Regular Rate:Normal     Neuro/Psych neg Seizures negative neurological ROS     GI/Hepatic Neg liver ROS, GERD  Controlled,diverticulitis   Endo/Other  neg diabetesHypothyroidism   Renal/GU negative Renal ROS     Musculoskeletal  (+) Arthritis , Osteoarthritis,    Abdominal (+) + obese,   Peds  Hematology negative hematology ROS (+)   Anesthesia Other Findings   Reproductive/Obstetrics                            Anesthesia Physical  Anesthesia Plan  ASA: III  Anesthesia Plan: General   Post-op Pain Management:    Induction: Intravenous  PONV Risk Score and Plan:   Airway Management Planned: Oral ETT  Additional Equipment:   Intra-op Plan:   Post-operative Plan: Extubation in OR  Informed Consent: I have reviewed the patients History and Physical, chart, labs and discussed the procedure including the risks, benefits and alternatives for the proposed anesthesia with the patient or authorized representative who has indicated his/her understanding and acceptance.     Dental advisory given  Plan Discussed with: CRNA  Anesthesia Plan  Comments: (Risks of general anesthesia discussed including, but not limited to, sore throat, hoarse voice, chipped/damaged teeth, injury to vocal cords, nausea and vomiting, allergic reactions, lung infection, heart attack, stroke, and death. All questions answered.  Patient is a Sales promotion account executive Witness and does not consent to pRBCs or whole blood. She states that she is okay accepting plasma and platelets if necessary. She will accept albumin. She is okay with cell saver.)        Anesthesia Quick Evaluation

## 2019-07-25 ENCOUNTER — Observation Stay (HOSPITAL_COMMUNITY)
Admission: RE | Admit: 2019-07-25 | Discharge: 2019-07-26 | Disposition: A | Discharge status: Home / Self Care | Payer: Medicare Other | Source: Other Acute Inpatient Hospital | Attending: Orthopedic Surgery | Admitting: Orthopedic Surgery

## 2019-07-25 ENCOUNTER — Ambulatory Visit (HOSPITAL_COMMUNITY): Payer: Medicare Other | Admitting: Anesthesiology

## 2019-07-25 ENCOUNTER — Encounter (HOSPITAL_COMMUNITY): Payer: Self-pay | Admitting: Orthopedic Surgery

## 2019-07-25 ENCOUNTER — Observation Stay (HOSPITAL_COMMUNITY): Payer: Medicare Other

## 2019-07-25 ENCOUNTER — Ambulatory Visit (HOSPITAL_COMMUNITY): Payer: Medicare Other

## 2019-07-25 ENCOUNTER — Ambulatory Visit (HOSPITAL_COMMUNITY): Payer: Medicare Other | Admitting: Physician Assistant

## 2019-07-25 ENCOUNTER — Encounter (HOSPITAL_COMMUNITY)
Admission: RE | Disposition: A | Discharge status: Home / Self Care | Payer: Self-pay | Source: Other Acute Inpatient Hospital | Attending: Orthopedic Surgery

## 2019-07-25 ENCOUNTER — Other Ambulatory Visit: Payer: Self-pay

## 2019-07-25 DIAGNOSIS — E039 Hypothyroidism, unspecified: Secondary | ICD-10-CM | POA: Insufficient documentation

## 2019-07-25 DIAGNOSIS — Z853 Personal history of malignant neoplasm of breast: Secondary | ICD-10-CM | POA: Diagnosis not present

## 2019-07-25 DIAGNOSIS — Z96642 Presence of left artificial hip joint: Secondary | ICD-10-CM | POA: Diagnosis not present

## 2019-07-25 DIAGNOSIS — G4730 Sleep apnea, unspecified: Secondary | ICD-10-CM | POA: Diagnosis not present

## 2019-07-25 DIAGNOSIS — I10 Essential (primary) hypertension: Secondary | ICD-10-CM | POA: Diagnosis not present

## 2019-07-25 DIAGNOSIS — D638 Anemia in other chronic diseases classified elsewhere: Secondary | ICD-10-CM | POA: Diagnosis not present

## 2019-07-25 DIAGNOSIS — Z7982 Long term (current) use of aspirin: Secondary | ICD-10-CM | POA: Insufficient documentation

## 2019-07-25 DIAGNOSIS — Z96641 Presence of right artificial hip joint: Secondary | ICD-10-CM | POA: Diagnosis not present

## 2019-07-25 DIAGNOSIS — Z9221 Personal history of antineoplastic chemotherapy: Secondary | ICD-10-CM | POA: Insufficient documentation

## 2019-07-25 DIAGNOSIS — M1612 Unilateral primary osteoarthritis, left hip: Secondary | ICD-10-CM | POA: Diagnosis not present

## 2019-07-25 DIAGNOSIS — Z79890 Hormone replacement therapy (postmenopausal): Secondary | ICD-10-CM | POA: Diagnosis not present

## 2019-07-25 DIAGNOSIS — Z87891 Personal history of nicotine dependence: Secondary | ICD-10-CM | POA: Diagnosis not present

## 2019-07-25 DIAGNOSIS — Z79899 Other long term (current) drug therapy: Secondary | ICD-10-CM | POA: Insufficient documentation

## 2019-07-25 DIAGNOSIS — Z471 Aftercare following joint replacement surgery: Secondary | ICD-10-CM | POA: Diagnosis not present

## 2019-07-25 DIAGNOSIS — M25552 Pain in left hip: Secondary | ICD-10-CM | POA: Diagnosis present

## 2019-07-25 DIAGNOSIS — Z419 Encounter for procedure for purposes other than remedying health state, unspecified: Secondary | ICD-10-CM

## 2019-07-25 DIAGNOSIS — Z09 Encounter for follow-up examination after completed treatment for conditions other than malignant neoplasm: Secondary | ICD-10-CM

## 2019-07-25 HISTORY — PX: TOTAL HIP ARTHROPLASTY: SHX124

## 2019-07-25 LAB — NO BLOOD PRODUCTS

## 2019-07-25 SURGERY — ARTHROPLASTY, HIP, TOTAL, ANTERIOR APPROACH
Anesthesia: General | Site: Hip | Laterality: Left

## 2019-07-25 MED ORDER — LIDOCAINE 2% (20 MG/ML) 5 ML SYRINGE
INTRAMUSCULAR | Status: AC
  Filled 2019-07-25: qty 5

## 2019-07-25 MED ORDER — METHOCARBAMOL 500 MG PO TABS: 500.0000 mg | ORAL_TABLET | Freq: Four times a day (QID) | ORAL | Status: DC | PRN

## 2019-07-25 MED ORDER — ASPIRIN 81 MG PO CHEW
81.0000 mg | CHEWABLE_TABLET | Freq: Two times a day (BID) | ORAL | Status: DC
  Administered 2019-07-25 – 2019-07-26 (×2): 81 mg via ORAL
  Filled 2019-07-25 (×2): qty 1

## 2019-07-25 MED ORDER — METOCLOPRAMIDE HCL 5 MG/ML IJ SOLN: 5.0000 mg | Freq: Three times a day (TID) | INTRAMUSCULAR | Status: DC | PRN

## 2019-07-25 MED ORDER — BUPIVACAINE HCL (PF) 0.25 % IJ SOLN
INTRAMUSCULAR | Status: DC | PRN
  Administered 2019-07-25: 30 mL

## 2019-07-25 MED ORDER — FENTANYL CITRATE (PF) 250 MCG/5ML IJ SOLN
INTRAMUSCULAR | Status: AC
  Filled 2019-07-25: qty 5

## 2019-07-25 MED ORDER — PROPOFOL 10 MG/ML IV BOLUS
INTRAVENOUS | Status: DC | PRN
  Administered 2019-07-25: 150 mg via INTRAVENOUS

## 2019-07-25 MED ORDER — MIDAZOLAM HCL 2 MG/2ML IJ SOLN
INTRAMUSCULAR | Status: AC
  Filled 2019-07-25: qty 2

## 2019-07-25 MED ORDER — OXYCODONE HCL 5 MG PO TABS: 5.0000 mg | ORAL_TABLET | Freq: Once | ORAL | Status: DC | PRN

## 2019-07-25 MED ORDER — HYDROCODONE-ACETAMINOPHEN 5-325 MG PO TABS
1.0000 | ORAL_TABLET | ORAL | Status: DC | PRN
  Administered 2019-07-25: 1 via ORAL
  Administered 2019-07-25: 2 via ORAL
  Filled 2019-07-25: qty 1
  Filled 2019-07-25 (×2): qty 2

## 2019-07-25 MED ORDER — LEVOTHYROXINE SODIUM 100 MCG PO TABS
100.0000 ug | ORAL_TABLET | Freq: Every day | ORAL | Status: DC
  Administered 2019-07-26: 100 ug via ORAL
  Filled 2019-07-25: qty 1

## 2019-07-25 MED ORDER — MORPHINE SULFATE (PF) 2 MG/ML IV SOLN: 0.5000 mg | INTRAVENOUS | Status: DC | PRN

## 2019-07-25 MED ORDER — DEXAMETHASONE SODIUM PHOSPHATE 10 MG/ML IJ SOLN
INTRAMUSCULAR | Status: AC
  Filled 2019-07-25: qty 1

## 2019-07-25 MED ORDER — KETOROLAC TROMETHAMINE 30 MG/ML IJ SOLN
INTRAMUSCULAR | Status: DC | PRN
  Administered 2019-07-25: 30 mg

## 2019-07-25 MED ORDER — ALPRAZOLAM 1 MG PO TABS
1.0000 mg | ORAL_TABLET | Freq: Three times a day (TID) | ORAL | Status: DC | PRN
  Administered 2019-07-25: 1 mg via ORAL
  Filled 2019-07-25: qty 1

## 2019-07-25 MED ORDER — POVIDONE-IODINE 10 % EX SWAB: 2.0000 "application " | Freq: Once | CUTANEOUS | Status: DC

## 2019-07-25 MED ORDER — HYDROCHLOROTHIAZIDE 25 MG PO TABS
25.0000 mg | ORAL_TABLET | Freq: Every day | ORAL | Status: DC
  Administered 2019-07-26: 25 mg via ORAL
  Filled 2019-07-25: qty 1

## 2019-07-25 MED ORDER — KETOROLAC TROMETHAMINE 15 MG/ML IJ SOLN
7.5000 mg | Freq: Four times a day (QID) | INTRAMUSCULAR | Status: AC
  Administered 2019-07-25 – 2019-07-26 (×4): 7.5 mg via INTRAVENOUS
  Filled 2019-07-25 (×4): qty 1

## 2019-07-25 MED ORDER — OLMESARTAN MEDOXOMIL-HCTZ 40-25 MG PO TABS: 1.0000 | ORAL_TABLET | Freq: Every day | ORAL | Status: DC

## 2019-07-25 MED ORDER — DIPHENHYDRAMINE HCL 12.5 MG/5ML PO ELIX: 12.5000 mg | ORAL_SOLUTION | ORAL | Status: DC | PRN

## 2019-07-25 MED ORDER — METHOCARBAMOL 500 MG IVPB - SIMPLE MED
INTRAVENOUS | Status: AC
  Administered 2019-07-25: 500 mg via INTRAVENOUS
  Filled 2019-07-25: qty 50

## 2019-07-25 MED ORDER — ACETAMINOPHEN 10 MG/ML IV SOLN
1000.0000 mg | INTRAVENOUS | Status: AC
  Administered 2019-07-25: 1000 mg via INTRAVENOUS
  Filled 2019-07-25: qty 100

## 2019-07-25 MED ORDER — FENTANYL CITRATE (PF) 100 MCG/2ML IJ SOLN
INTRAMUSCULAR | Status: AC
  Administered 2019-07-25: 25 ug via INTRAVENOUS
  Filled 2019-07-25: qty 2

## 2019-07-25 MED ORDER — ONDANSETRON HCL 4 MG/2ML IJ SOLN
INTRAMUSCULAR | Status: AC
  Filled 2019-07-25: qty 2

## 2019-07-25 MED ORDER — SODIUM CHLORIDE 0.9 % IV SOLN: INTRAVENOUS | Status: DC

## 2019-07-25 MED ORDER — HYDROMORPHONE HCL 1 MG/ML IJ SOLN
0.5000 mg | INTRAMUSCULAR | Status: DC | PRN
  Administered 2019-07-25: 0.5 mg via INTRAVENOUS
  Filled 2019-07-25: qty 0.5

## 2019-07-25 MED ORDER — MENTHOL 3 MG MT LOZG: 1.0000 | LOZENGE | OROMUCOSAL | Status: DC | PRN

## 2019-07-25 MED ORDER — ONDANSETRON HCL 4 MG/2ML IJ SOLN
4.0000 mg | Freq: Four times a day (QID) | INTRAMUSCULAR | Status: DC | PRN
  Administered 2019-07-25 – 2019-07-26 (×2): 4 mg via INTRAVENOUS
  Filled 2019-07-25 (×2): qty 2

## 2019-07-25 MED ORDER — SODIUM CHLORIDE 0.9 % IR SOLN
Status: DC | PRN
  Administered 2019-07-25: 4000 mL

## 2019-07-25 MED ORDER — FENTANYL CITRATE (PF) 100 MCG/2ML IJ SOLN: 25.0000 ug | INTRAMUSCULAR | Status: DC | PRN

## 2019-07-25 MED ORDER — BUPIVACAINE HCL 0.25 % IJ SOLN
INTRAMUSCULAR | Status: AC
  Filled 2019-07-25: qty 1

## 2019-07-25 MED ORDER — POLYETHYLENE GLYCOL 3350 17 G PO PACK: 17.0000 g | PACK | Freq: Every day | ORAL | Status: DC | PRN

## 2019-07-25 MED ORDER — METHOCARBAMOL 500 MG IVPB - SIMPLE MED
500.0000 mg | Freq: Four times a day (QID) | INTRAVENOUS | Status: DC | PRN
  Filled 2019-07-25: qty 50

## 2019-07-25 MED ORDER — MEPERIDINE HCL 50 MG/ML IJ SOLN: 6.2500 mg | INTRAMUSCULAR | Status: DC | PRN

## 2019-07-25 MED ORDER — LIDOCAINE 2% (20 MG/ML) 5 ML SYRINGE
INTRAMUSCULAR | Status: DC | PRN
  Administered 2019-07-25: 100 mg via INTRAVENOUS

## 2019-07-25 MED ORDER — CHLORHEXIDINE GLUCONATE 4 % EX LIQD: 60.0000 mL | Freq: Once | CUTANEOUS | Status: DC

## 2019-07-25 MED ORDER — SENNA 8.6 MG PO TABS
1.0000 | ORAL_TABLET | Freq: Two times a day (BID) | ORAL | Status: DC
  Administered 2019-07-25 – 2019-07-26 (×2): 8.6 mg via ORAL
  Filled 2019-07-25 (×2): qty 1

## 2019-07-25 MED ORDER — CEFAZOLIN SODIUM-DEXTROSE 2-4 GM/100ML-% IV SOLN
2.0000 g | INTRAVENOUS | Status: AC
  Administered 2019-07-25: 08:00:00 2 g via INTRAVENOUS
  Filled 2019-07-25: qty 100

## 2019-07-25 MED ORDER — IRRISEPT - 450ML BOTTLE WITH 0.05% CHG IN STERILE WATER, USP 99.95% OPTIME
TOPICAL | Status: DC | PRN
  Administered 2019-07-25: 450 mL

## 2019-07-25 MED ORDER — ISOPROPYL ALCOHOL 70 % SOLN
Status: DC | PRN
  Administered 2019-07-25: 1 via TOPICAL

## 2019-07-25 MED ORDER — KETOROLAC TROMETHAMINE 30 MG/ML IJ SOLN
INTRAMUSCULAR | Status: AC
  Filled 2019-07-25: qty 1

## 2019-07-25 MED ORDER — IRBESARTAN 150 MG PO TABS
300.0000 mg | ORAL_TABLET | Freq: Every day | ORAL | Status: DC
  Filled 2019-07-25: qty 2

## 2019-07-25 MED ORDER — DEXMEDETOMIDINE HCL IN NACL 200 MCG/50ML IV SOLN
INTRAVENOUS | Status: DC | PRN
  Administered 2019-07-25: 20 ug via INTRAVENOUS

## 2019-07-25 MED ORDER — HYDROCODONE-ACETAMINOPHEN 7.5-325 MG PO TABS: 1.0000 | ORAL_TABLET | ORAL | Status: DC | PRN

## 2019-07-25 MED ORDER — SODIUM CHLORIDE (PF) 0.9 % IJ SOLN
INTRAMUSCULAR | Status: DC | PRN
  Administered 2019-07-25: 30 mL

## 2019-07-25 MED ORDER — DEXAMETHASONE SODIUM PHOSPHATE 10 MG/ML IJ SOLN
INTRAMUSCULAR | Status: DC | PRN
  Administered 2019-07-25: 10 mg via INTRAVENOUS

## 2019-07-25 MED ORDER — ROCURONIUM BROMIDE 10 MG/ML (PF) SYRINGE
PREFILLED_SYRINGE | INTRAVENOUS | Status: AC
  Filled 2019-07-25: qty 10

## 2019-07-25 MED ORDER — SUGAMMADEX SODIUM 200 MG/2ML IV SOLN
INTRAVENOUS | Status: DC | PRN
  Administered 2019-07-25: 200 mg via INTRAVENOUS

## 2019-07-25 MED ORDER — SODIUM CHLORIDE 0.9 % IV SOLN: 0.5000 mg/h | INTRAVENOUS | Status: DC | PRN

## 2019-07-25 MED ORDER — TRANEXAMIC ACID-NACL 1000-0.7 MG/100ML-% IV SOLN
1000.0000 mg | INTRAVENOUS | Status: AC
  Administered 2019-07-25: 08:00:00 1000 mg via INTRAVENOUS
  Filled 2019-07-25: qty 100

## 2019-07-25 MED ORDER — ACETAMINOPHEN 325 MG PO TABS
325.0000 mg | ORAL_TABLET | Freq: Four times a day (QID) | ORAL | Status: DC | PRN
  Filled 2019-07-25: qty 2

## 2019-07-25 MED ORDER — WATER FOR IRRIGATION, STERILE IR SOLN
Status: DC | PRN
  Administered 2019-07-25: 2000 mL

## 2019-07-25 MED ORDER — PHENYLEPHRINE 40 MCG/ML (10ML) SYRINGE FOR IV PUSH (FOR BLOOD PRESSURE SUPPORT)
PREFILLED_SYRINGE | INTRAVENOUS | Status: AC
  Filled 2019-07-25: qty 10

## 2019-07-25 MED ORDER — PROPOFOL 10 MG/ML IV BOLUS
INTRAVENOUS | Status: AC
  Filled 2019-07-25: qty 40

## 2019-07-25 MED ORDER — ROCURONIUM BROMIDE 10 MG/ML (PF) SYRINGE
PREFILLED_SYRINGE | INTRAVENOUS | Status: DC | PRN
  Administered 2019-07-25: 100 mg via INTRAVENOUS

## 2019-07-25 MED ORDER — OXYCODONE HCL 5 MG/5ML PO SOLN: 5.0000 mg | Freq: Once | ORAL | Status: DC | PRN

## 2019-07-25 MED ORDER — DEXAMETHASONE SODIUM PHOSPHATE 10 MG/ML IJ SOLN
10.0000 mg | Freq: Once | INTRAMUSCULAR | Status: AC
  Administered 2019-07-26: 10 mg via INTRAVENOUS
  Filled 2019-07-25: qty 1

## 2019-07-25 MED ORDER — LACTATED RINGERS IV SOLN: INTRAVENOUS | Status: DC

## 2019-07-25 MED ORDER — METOCLOPRAMIDE HCL 5 MG PO TABS: 5.0000 mg | ORAL_TABLET | Freq: Three times a day (TID) | ORAL | Status: DC | PRN

## 2019-07-25 MED ORDER — ACETAMINOPHEN 160 MG/5ML PO SOLN: 325.0000 mg | ORAL | Status: DC | PRN

## 2019-07-25 MED ORDER — ALUM & MAG HYDROXIDE-SIMETH 200-200-20 MG/5ML PO SUSP: 30.0000 mL | ORAL | Status: DC | PRN

## 2019-07-25 MED ORDER — CEFAZOLIN SODIUM-DEXTROSE 2-4 GM/100ML-% IV SOLN
2.0000 g | Freq: Four times a day (QID) | INTRAVENOUS | Status: AC
  Administered 2019-07-25 (×2): 2 g via INTRAVENOUS
  Filled 2019-07-25 (×2): qty 100

## 2019-07-25 MED ORDER — ONDANSETRON HCL 4 MG/2ML IJ SOLN
4.0000 mg | Freq: Once | INTRAMUSCULAR | Status: AC | PRN
  Administered 2019-07-25: 4 mg via INTRAVENOUS

## 2019-07-25 MED ORDER — ONDANSETRON HCL 4 MG PO TABS: 4.0000 mg | ORAL_TABLET | Freq: Four times a day (QID) | ORAL | Status: DC | PRN

## 2019-07-25 MED ORDER — EPHEDRINE SULFATE-NACL 50-0.9 MG/10ML-% IV SOSY
PREFILLED_SYRINGE | INTRAVENOUS | Status: DC | PRN
  Administered 2019-07-25 (×3): 10 mg via INTRAVENOUS

## 2019-07-25 MED ORDER — SODIUM CHLORIDE (PF) 0.9 % IJ SOLN
INTRAMUSCULAR | Status: AC
  Filled 2019-07-25: qty 50

## 2019-07-25 MED ORDER — PHENOL 1.4 % MT LIQD: 1.0000 | OROMUCOSAL | Status: DC | PRN

## 2019-07-25 MED ORDER — FENTANYL CITRATE (PF) 250 MCG/5ML IJ SOLN
INTRAMUSCULAR | Status: DC | PRN
  Administered 2019-07-25 (×5): 50 ug via INTRAVENOUS

## 2019-07-25 MED ORDER — MIDAZOLAM HCL 5 MG/5ML IJ SOLN
INTRAMUSCULAR | Status: DC | PRN
  Administered 2019-07-25: 2 mg via INTRAVENOUS

## 2019-07-25 MED ORDER — LACTATED RINGERS IV SOLN: INTRAVENOUS | Status: DC | PRN

## 2019-07-25 MED ORDER — ACETAMINOPHEN 325 MG PO TABS: 325.0000 mg | ORAL_TABLET | ORAL | Status: DC | PRN

## 2019-07-25 MED ORDER — DOCUSATE SODIUM 100 MG PO CAPS
100.0000 mg | ORAL_CAPSULE | Freq: Two times a day (BID) | ORAL | Status: DC
  Administered 2019-07-25 – 2019-07-26 (×2): 100 mg via ORAL
  Filled 2019-07-25 (×2): qty 1

## 2019-07-25 SURGICAL SUPPLY — 56 items
BAG DECANTER FOR FLEXI CONT (MISCELLANEOUS) IMPLANT
BAG ZIPLOCK 12X15 (MISCELLANEOUS) IMPLANT
BLADE SURG SZ10 CARB STEEL (BLADE) IMPLANT
CHLORAPREP W/TINT 26 (MISCELLANEOUS) ×2 IMPLANT
COVER PERINEAL POST (MISCELLANEOUS) ×2 IMPLANT
COVER SURGICAL LIGHT HANDLE (MISCELLANEOUS) ×2 IMPLANT
COVER WAND RF STERILE (DRAPES) IMPLANT
DECANTER SPIKE VIAL GLASS SM (MISCELLANEOUS) ×2 IMPLANT
DERMABOND ADVANCED (GAUZE/BANDAGES/DRESSINGS) ×2
DERMABOND ADVANCED .7 DNX12 (GAUZE/BANDAGES/DRESSINGS) ×2 IMPLANT
DRAPE IMP U-DRAPE 54X76 (DRAPES) ×2 IMPLANT
DRAPE SHEET LG 3/4 BI-LAMINATE (DRAPES) ×6 IMPLANT
DRAPE STERI IOBAN 125X83 (DRAPES) IMPLANT
DRAPE U-SHAPE 47X51 STRL (DRAPES) ×4 IMPLANT
DRSG AQUACEL AG ADV 3.5X10 (GAUZE/BANDAGES/DRESSINGS) ×2 IMPLANT
ELECT REM PT RETURN 15FT ADLT (MISCELLANEOUS) ×2 IMPLANT
GAUZE SPONGE 4X4 12PLY STRL (GAUZE/BANDAGES/DRESSINGS) ×2 IMPLANT
GLOVE BIO SURGEON STRL SZ8.5 (GLOVE) ×4 IMPLANT
GLOVE BIOGEL PI IND STRL 8.5 (GLOVE) ×1 IMPLANT
GLOVE BIOGEL PI INDICATOR 8.5 (GLOVE) ×1
GOWN SPEC L3 XXLG W/TWL (GOWN DISPOSABLE) ×2 IMPLANT
HANDPIECE INTERPULSE COAX TIP (DISPOSABLE) ×1
HEAD FEMORAL 32 CERAMIC (Hips) ×1 IMPLANT
HOLDER FOLEY CATH W/STRAP (MISCELLANEOUS) ×2 IMPLANT
HOOD PEEL AWAY FLYTE STAYCOOL (MISCELLANEOUS) ×8 IMPLANT
JET LAVAGE IRRISEPT WOUND (IRRIGATION / IRRIGATOR) ×2
KIT TURNOVER KIT A (KITS) IMPLANT
LAVAGE JET IRRISEPT WOUND (IRRIGATION / IRRIGATOR) ×1 IMPLANT
LINER PINN ACET NEUT 32X52 ×1 IMPLANT
MANIFOLD NEPTUNE II (INSTRUMENTS) ×2 IMPLANT
MARKER SKIN DUAL TIP RULER LAB (MISCELLANEOUS) ×2 IMPLANT
NDL SAFETY ECLIPSE 18X1.5 (NEEDLE) ×1 IMPLANT
NDL SPNL 18GX3.5 QUINCKE PK (NEEDLE) ×1 IMPLANT
NEEDLE HYPO 18GX1.5 SHARP (NEEDLE) ×1
NEEDLE SPNL 18GX3.5 QUINCKE PK (NEEDLE) ×2 IMPLANT
PACK ANTERIOR HIP CUSTOM (KITS) ×2 IMPLANT
PENCIL SMOKE EVACUATOR (MISCELLANEOUS) IMPLANT
PIN SECTOR W/GRIP ACE CUP 52MM (Hips) ×1 IMPLANT
SAW OSC TIP CART 19.5X105X1.3 (SAW) ×2 IMPLANT
SEALER BIPOLAR AQUA 6.0 (INSTRUMENTS) ×2 IMPLANT
SET HNDPC FAN SPRY TIP SCT (DISPOSABLE) ×1 IMPLANT
STEM TRI LOC BPS SZ4 W GRIPTON (Hips) IMPLANT
SUT ETHIBOND NAB CT1 #1 30IN (SUTURE) ×3 IMPLANT
SUT MNCRL AB 3-0 PS2 18 (SUTURE) ×2 IMPLANT
SUT MNCRL AB 4-0 PS2 18 (SUTURE) ×2 IMPLANT
SUT MON AB 2-0 CT1 36 (SUTURE) ×3 IMPLANT
SUT STRATAFIX PDO 1 14 VIOLET (SUTURE) ×1
SUT STRATFX PDO 1 14 VIOLET (SUTURE) ×1
SUT VIC AB 2-0 CT1 27 (SUTURE) ×1
SUT VIC AB 2-0 CT1 TAPERPNT 27 (SUTURE) ×1 IMPLANT
SUTURE STRATFX PDO 1 14 VIOLET (SUTURE) ×1 IMPLANT
SYR 3ML LL SCALE MARK (SYRINGE) ×2 IMPLANT
TRAY FOLEY MTR SLVR 16FR STAT (SET/KITS/TRAYS/PACK) IMPLANT
TRI LOC BPS SZ 4 W GRIPTON (Hips) ×2 IMPLANT
WATER STERILE IRR 1000ML POUR (IV SOLUTION) ×2 IMPLANT
YANKAUER SUCT BULB TIP 10FT TU (MISCELLANEOUS) ×2 IMPLANT

## 2019-07-25 NOTE — Op Note (Signed)
OPERATIVE REPORT  SURGEON: Rod Can, MD   ASSISTANT: Nehemiah Massed, PA-C.  PREOPERATIVE DIAGNOSIS: Left hip arthritis.   POSTOPERATIVE DIAGNOSIS: Left hip arthritis.   PROCEDURE: Left total hip arthroplasty, anterior approach.   IMPLANTS: DePuy Tri Lock stem, size 4, hi offset. DePuy Pinnacle Cup, size 52 mm. DePuy Altrx liner, size 32 by 52 mm, neutral. DePuy Biolox ceramic head ball, size 32 + 1 mm.  ANESTHESIA:  General  ESTIMATED BLOOD LOSS:-450 mL    ANTIBIOTICS: 2 g Ancef.  DRAINS: None.  COMPLICATIONS: None.   CONDITION: PACU - hemodynamically stable.   BRIEF CLINICAL NOTE: Karen [name] is a 74 y.o. female with a long-standing history of Left hip arthritis. After failing conservative management, the patient was indicated for total hip arthroplasty. The risks, benefits, and alternatives to the procedure were explained, and the patient elected to proceed.  PROCEDURE IN DETAIL: Surgical site was marked by myself in the pre-op holding area. Once inside the operating room, spinal anesthesia was obtained, and a foley catheter was inserted. The patient was then positioned on the Hana table.  All bony prominences were well padded.  The hip was prepped and draped in the normal sterile surgical fashion.  A time-out was called verifying side and site of surgery. The patient received IV antibiotics within 60 minutes of beginning the procedure.   The direct anterior approach to the hip was performed through the Hueter interval.  Lateral femoral circumflex vessels were treated with the Auqumantys. The anterior capsule was exposed and an inverted T capsulotomy was made. The femoral neck cut was made to the level of the templated cut.  A corkscrew was placed into the head and the head was removed.  The femoral head was found to have eburnated bone. The head was passed to the back table and was measured.   Acetabular exposure was achieved, and the pulvinar and labrum were  excised. Sequential reaming of the acetabulum was then performed up to a size 51 mm reamer. A 52 mm cup was then opened and impacted into place at approximately 40 degrees of abduction and 20 degrees of anteversion. The final polyethylene liner was impacted into place and acetabular osteophytes were removed.    I then gained femoral exposure taking care to protect the abductors and greater trochanter.  This was performed using standard external rotation, extension, and adduction.  The capsule was peeled off the inner aspect of the greater trochanter, taking care to preserve the short external rotators. A cookie cutter was used to enter the femoral canal, and then the femoral canal finder was placed.  Sequential broaching was performed up to a size 4.  Calcar planer was used on the femoral neck remnant.  I placed a hi offset neck and a trial head ball.  The hip was reduced.  Leg lengths and offset were checked fluoroscopically.  The hip was dislocated and trial components were removed.  The final implants were placed, and the hip was reduced.  Fluoroscopy was used to confirm component position and leg lengths.  At 90 degrees of external rotation and full extension, the hip was stable to an anterior directed force.   The wound was copiously irrigated with Irrisept solution and normal saline using pule lavage.  Marcaine solution was injected into the periarticular soft tissue.  The wound was closed in layers using #1 Stratafix for the fascia, 2-0 Vicryl for the subcutaneous fat, 2-0 Monocryl for the deep dermal layer, 3-0 running Monocryl subcuticular stitch, and  Dermabond for the skin.  Once the glue was fully dried, an Aquacell Ag dressing was applied.  The patient was transported to the recovery room in stable condition.  Sponge, needle, and instrument counts were correct at the end of the case x2.  The patient tolerated the procedure well and there were no known complications.  Please note that a surgical  assistant was a medical necessity for this procedure to perform it in a safe and expeditious manner. Assistant was necessary to provide appropriate retraction of vital neurovascular structures, to prevent femoral fracture, and to allow for anatomic placement of the prosthesis.

## 2019-07-25 NOTE — Evaluation (Signed)
Physical Therapy Evaluation Patient Details Name: Karen [name] MRN: [id number] DOB: 10-Sep-1945 Today's Date: 07/25/2019   History of Present Illness  Patient is 74 y.o. female s/p Lt THA anterior approach on 07/25/19 with PMH significant forbreast cancer s/p bilateral mastectomy, diverticulosis, GERD, thyroid dysfunction, HTN, OSA Rt THA in 2017.    Clinical Impression  Karen [name] is a 74 y.o. female POD 0 s/p Lt THA. Patient reports independence with mobility at baseline. Patient is now limited by functional impairments (see PT problem list below) and requires min guard for transfers and gait with RW. Patient was able to ambulate ~100 feet with RW and min guard assist. Patient instructed in exercise to facilitate circulation. Patient will benefit from continued skilled PT interventions to address impairments and progress towards PLOF. Acute PT will follow to progress mobility and stair training in preparation for safe discharge home.    Follow Up Recommendations Follow surgeon's recommendation for DC plan and follow-up therapies    Equipment Recommendations  Rolling walker with 5" wheels(may need youth vs regular, check in AM tomorrow)    Recommendations for Other Services       Precautions / Restrictions Precautions Precautions: Fall Restrictions Weight Bearing Restrictions: No      Mobility  Bed Mobility Overal bed mobility: Needs Assistance Bed Mobility: Supine to Sit     Supine to sit: Supervision;HOB elevated     General bed mobility comments: no assist or cues required, pt with increased time but moving well  Transfers Overall transfer level: Needs assistance Equipment used: Rolling walker (2 wheeled) Transfers: Sit to/from Stand Sit to Stand: Min guard         General transfer comment: no cues needed for safe hand placement/technique, pt able to perform power up and complete rise without assist, guarding for safety  Ambulation/Gait Ambulation/Gait  assistance: Min guard Gait Distance (Feet): 100 Feet Assistive device: Rolling walker (2 wheeled) Gait Pattern/deviations: Step-through pattern;Decreased step length - right;Decreased stance time - left;Decreased weight shift to left Gait velocity: WNLs   General Gait Details: pt with slight deecrease in weight shift to Lt LE able to [employer] herself with RW, no overt LOB  Stairs            Wheelchair Mobility    Modified Rankin (Stroke Patients Only)       Balance Overall balance assessment: Needs assistance Sitting-balance support: Feet supported Sitting balance-Leahy Scale: Good     Standing balance support: During functional activity;Bilateral upper extremity supported Standing balance-Leahy Scale: Fair            Pertinent Vitals/Pain Pain Assessment: 0-10 Pain Score: 3  Pain Location: Lt hip Pain Descriptors / Indicators: Aching;Burning Pain Intervention(s): Limited activity within patient's tolerance;Monitored during session;Repositioned    Home Living Family/patient expects to be discharged to:: Private residence Living Arrangements: Spouse/significant other;Children Available Help at Discharge: Family;Available 24 hours/day Type of Home: House Home Access: Stairs to enter Entrance Stairs-Rails: None Entrance Stairs-Number of Steps: 1 Home Layout: Two level;Full bath on main level;Able to live on main level with bedroom/bathroom Home Equipment: Walker - standard;Bedside commode;Grab bars - tub/shower;Shower seat - built in;Hand held shower head      Prior Function Level of Independence: Independent         Comments: hip gradualy got worse but pt has not requried AD to mobilize     Hand Dominance   Dominant Hand: Right    Extremity/Trunk Assessment   Upper Extremity Assessment Upper Extremity Assessment: Overall  WFL for tasks assessed    Lower Extremity Assessment Lower Extremity Assessment: Overall WFL for tasks assessed    Cervical  / Trunk Assessment Cervical / Trunk Assessment: Normal  Communication   Communication: No difficulties  Cognition Arousal/Alertness: Awake/alert Behavior During Therapy: WFL for tasks assessed/performed Overall Cognitive Status: Within Functional Limits for tasks assessed       General Comments      Exercises Total Joint Exercises Ankle Circles/Pumps: AROM;10 reps;Seated;Both   Assessment/Plan    PT Assessment Patient needs continued PT services  PT Problem List Decreased strength;Decreased balance;Decreased mobility;Decreased range of motion;Decreased activity tolerance;Decreased knowledge of use of DME       PT Treatment Interventions DME instruction;Functional mobility training;Balance training;Patient/family education;Therapeutic activities;Gait training;Stair training;Therapeutic exercise    PT Goals (Current goals can be found in the Care Plan section)  Acute Rehab PT Goals Patient Stated Goal: to get back to walking for exercise PT Goal Formulation: With patient Time For Goal Achievement: 08/01/19 Potential to Achieve Goals: Good    Frequency 7X/week    AM-PAC PT "6 Clicks" Mobility  Outcome Measure Help needed turning from your back to your side while in a flat bed without using bedrails?: None Help needed moving from lying on your back to sitting on the side of a flat bed without using bedrails?: A Little Help needed moving to and from a bed to a chair (including a wheelchair)?: A Little Help needed standing up from a chair using your arms (e.g., wheelchair or bedside chair)?: A Little Help needed to walk in hospital room?: A Little Help needed climbing 3-5 steps with a railing? : A Little 6 Click Score: 19    End of Session Equipment Utilized During Treatment: Gait belt Activity Tolerance: Patient tolerated treatment well Patient left: with call bell/phone within reach;in chair;with chair alarm set Nurse Communication: Mobility status PT Visit Diagnosis:  Muscle weakness (generalized) (M62.81);Difficulty in walking, not elsewhere classified (R26.2)    Time: [id number] PT Time Calculation (min) (ACUTE ONLY): 32 min   Charges:   PT Evaluation $PT Eval Low Complexity: 1 Low PT Treatments $Gait Training: 8-22 mins       Verner Mould, DPT Physical Therapist with [facility] [phone]  07/25/2019 7:10 PM

## 2019-07-25 NOTE — Discharge Instructions (Signed)
°Dr. Nesreen Albano °Joint Replacement Specialist °[facility] °[address] °[place], [place] [address] °[phone] ° ° °TOTAL HIP REPLACEMENT POSTOPERATIVE DIRECTIONS ° ° ° °Hip Rehabilitation, Guidelines Following Surgery  ° °WEIGHT BEARING °Weight bearing as tolerated with assist device (walker, cane, etc) as directed, use it as long as suggested by your surgeon or therapist, typically at least 4-6 weeks. ° °The results of a hip operation are greatly improved after range of motion and muscle strengthening exercises. Follow all safety measures which are given to protect your hip. If any of these exercises cause increased pain or swelling in your joint, decrease the amount until you are comfortable again. Then slowly increase the exercises. Call your caregiver if you have problems or questions.  ° °HOME CARE INSTRUCTIONS  °Most of the following instructions are designed to prevent the dislocation of your new hip.  °Remove items at home which could result in a fall. This includes throw rugs or furniture in walking pathways.  °Continue medications as instructed at time of discharge. °· You may have some home medications which will be placed on hold until you complete the course of blood thinner medication. °· You may start showering once you are discharged home. Do not remove your dressing. °Do not put on socks or shoes without following the instructions of your caregivers.   °Sit on chairs with arms. Use the chair arms to help push yourself up when arising.  °Arrange for the use of a toilet seat elevator so you are not sitting low.  °· Walk with walker as instructed.  °You may resume a sexual relationship in one month or when given the OK by your caregiver.  °Use walker as long as suggested by your caregivers.  °You may put full weight on your legs and walk as much as is comfortable. °Avoid periods of inactivity such as sitting longer than an hour when not asleep. This helps prevent  blood clots.  °You may return to work once you are cleared by your surgeon.  °Do not drive a car for 6 weeks or until released by your surgeon.  °Do not drive while taking narcotics.  °Wear elastic stockings for two weeks following surgery during the day but you may remove then at night.  °Make sure you keep all of your appointments after your operation with all of your doctors and caregivers. You should call the office at the above phone number and make an appointment for approximately two weeks after the date of your surgery. °Please pick up a stool softener and laxative for home use as long as you are requiring pain medications. °· ICE to the affected hip every three hours for 30 minutes at a time and then as needed for pain and swelling. Continue to use ice on the hip for pain and swelling from surgery. You may notice swelling that will progress down to the foot and ankle.  This is normal after surgery.  Elevate the leg when you are not up walking on it.   °It is important for you to complete the blood thinner medication as prescribed by your doctor. °· Continue to use the breathing machine which will help keep your temperature down.  It is common for your temperature to cycle up and down following surgery, especially at night when you are not up moving around and exerting yourself.  The breathing machine keeps your lungs expanded and your temperature down. ° °RANGE OF MOTION AND STRENGTHENING EXERCISES  °These exercises are   designed to help you keep full movement of your hip joint. Follow your caregiver's or physical therapist's instructions. Perform all exercises about fifteen times, three times per day or as directed. Exercise both hips, even if you have had only one joint replacement. These exercises can be done on a training (exercise) mat, on the floor, on a table or on a bed. Use whatever works the best and is most comfortable for you. Use music or television while you are exercising so that the exercises  are a pleasant break in your day. This will make your life better with the exercises acting as a break in routine you can look forward to.  °Lying on your back, slowly slide your foot toward your buttocks, raising your knee up off the floor. Then slowly slide your foot back down until your leg is straight again.  °Lying on your back spread your legs as far apart as you can without causing discomfort.  °Lying on your side, raise your upper leg and foot straight up from the floor as far as is comfortable. Slowly lower the leg and repeat.  °Lying on your back, tighten up the muscle in the front of your thigh (quadriceps muscles). You can do this by keeping your leg straight and trying to raise your heel off the floor. This helps strengthen the largest muscle supporting your knee.  °Lying on your back, tighten up the muscles of your buttocks both with the legs straight and with the knee bent at a comfortable angle while keeping your heel on the floor.  ° °SKILLED REHAB INSTRUCTIONS: °If the patient is transferred to a skilled rehab facility following release from the hospital, a list of the current medications will be sent to the facility for the patient to continue.  When discharged from the skilled rehab facility, please have the facility set up the patient's Home Health Physical Therapy prior to being released. Also, the skilled facility will be responsible for providing the patient with their medications at time of release from the facility to include their pain medication and their blood thinner medication. If the patient is still at the rehab facility at time of the two week follow up appointment, the skilled rehab facility will also need to assist the patient in arranging follow up appointment in our office and any transportation needs. ° °MAKE SURE YOU:  °Understand these instructions.  °Will watch your condition.  °Will get help right away if you are not doing well or get worse. ° °Pick up stool softner and  laxative for home use following surgery while on pain medications. °Do not remove your dressing. °The dressing is waterproof--it is OK to take showers. °Continue to use ice for pain and swelling after surgery. °Do not use any lotions or creams on the incision until instructed by your surgeon. °Total Hip Protocol. ° ° °

## 2019-07-25 NOTE — Progress Notes (Signed)
PT Cancellation Note  Patient Details Name: Karen [name] MRN: [id number] DOB: [birth year]   Cancelled Treatment:    Reason Eval/Treat Not Completed: Medical issues which prohibited therapy(Pt sitting up resting in bed at therapist arrival. Pt agreeable to therapy however pt immediately experienced a bout of green emesis and became diaphoretic. RN in room taking vitals and obtained nausea meds for patient. Will follow up at later date/time as schedule allows and pt able.)  Verner Mould, DPT Physical Therapist with [facility] [phone]  07/25/2019 3:13 PM

## 2019-07-25 NOTE — Interval H&P Note (Signed)
History and Physical Interval Note:  07/25/2019 7:34 AM  Karen [name]  has presented today for surgery, with the diagnosis of degenerative joint disease left hip.  The various methods of treatment have been discussed with the patient and family. After consideration of risks, benefits and other options for treatment, the patient has consented to  Procedure(s): TOTAL HIP ARTHROPLASTY ANTERIOR APPROACH (Left) as a surgical intervention.  The patient's history has been reviewed, patient examined, no change in status, stable for surgery.  I have reviewed the patient's chart and labs.  Questions were answered to the patient's satisfaction.     Hilton Cork Dashia Caldeira

## 2019-07-25 NOTE — Transfer of Care (Signed)
Immediate Anesthesia Transfer of Care Note  Patient: Karen [name]  Procedure(s) Performed: TOTAL HIP ARTHROPLASTY ANTERIOR APPROACH (Left Hip)  Patient Location: PACU  Anesthesia Type:General  Level of Consciousness: awake, sedated and patient cooperative  Airway & Oxygen Therapy: Patient Spontanous Breathing and Patient connected to face mask oxygen  Post-op Assessment: Report given to RN and Post -op Vital signs reviewed and stable  Post vital signs: stable  Last Vitals:  Vitals Value Taken Time  BP 148/76 07/25/19 1031  Temp    Pulse 68 07/25/19 1032  Resp 12 07/25/19 1032  SpO2 100 % 07/25/19 1032  Vitals shown include unvalidated device data.  Last Pain:  Vitals:   07/25/19 0553  TempSrc: Oral  PainSc:          Complications: No apparent anesthesia complications

## 2019-07-25 NOTE — Progress Notes (Signed)
Swinteck, Aaron Edelman, MD gave verbal orders to d/c the PRN Morphine and add IV Dilaudid 0.5 q-2 PRN for severe pain.

## 2019-07-25 NOTE — Anesthesia Postprocedure Evaluation (Signed)
Anesthesia Post Note  Patient: Karen [name]  Procedure(s) Performed: TOTAL HIP ARTHROPLASTY ANTERIOR APPROACH (Left Hip)     Patient location during evaluation: PACU Anesthesia Type: General Level of consciousness: awake and alert Pain management: pain level controlled Vital Signs Assessment: post-procedure vital signs reviewed and stable Respiratory status: spontaneous breathing, nonlabored ventilation, respiratory function stable and patient connected to nasal cannula oxygen Cardiovascular status: blood pressure returned to baseline and stable Postop Assessment: no apparent nausea or vomiting Anesthetic complications: no    Last Vitals:  Vitals:   07/25/19 1156 07/25/19 1312  BP: 133/72 128/70  Pulse: 67 72  Resp: 14 16  Temp: (!) 36.4 C   SpO2: 95% 94%    Last Pain:  Vitals:   07/25/19 1226  TempSrc:   PainSc: 8                  [name]

## 2019-07-25 NOTE — Anesthesia Procedure Notes (Signed)
Procedure Name: Intubation Date/Time: 07/25/2019 7:52 AM Performed by: Pilar Grammes, CRNA Pre-anesthesia Checklist: Patient identified, Emergency Drugs available, Suction available, Patient being monitored and Timeout performed Patient Re-evaluated:Patient Re-evaluated prior to induction Oxygen Delivery Method: Circle system utilized Preoxygenation: Pre-oxygenation with 100% oxygen Induction Type: IV induction Ventilation: Mask ventilation without difficulty Laryngoscope Size: Miller, 3 and Mac Grade View: Grade I Tube type: Oral Tube size: 7.5 mm Number of attempts: 1 Airway Equipment and Method: Stylet Placement Confirmation: positive ETCO2,  ETT inserted through vocal cords under direct vision,  CO2 detector and breath sounds checked- equal and bilateral Secured at: 21 cm Tube secured with: Tape Dental Injury: Teeth and Oropharynx as per pre-operative assessment

## 2019-07-26 ENCOUNTER — Encounter: Payer: Self-pay | Admitting: *Deleted

## 2019-07-26 DIAGNOSIS — Z96641 Presence of right artificial hip joint: Secondary | ICD-10-CM | POA: Diagnosis not present

## 2019-07-26 DIAGNOSIS — Z96642 Presence of left artificial hip joint: Secondary | ICD-10-CM | POA: Diagnosis not present

## 2019-07-26 DIAGNOSIS — Z7982 Long term (current) use of aspirin: Secondary | ICD-10-CM | POA: Diagnosis not present

## 2019-07-26 DIAGNOSIS — Z853 Personal history of malignant neoplasm of breast: Secondary | ICD-10-CM | POA: Diagnosis not present

## 2019-07-26 DIAGNOSIS — G4730 Sleep apnea, unspecified: Secondary | ICD-10-CM | POA: Diagnosis not present

## 2019-07-26 DIAGNOSIS — Z79899 Other long term (current) drug therapy: Secondary | ICD-10-CM | POA: Diagnosis not present

## 2019-07-26 DIAGNOSIS — Z87891 Personal history of nicotine dependence: Secondary | ICD-10-CM | POA: Diagnosis not present

## 2019-07-26 DIAGNOSIS — M1612 Unilateral primary osteoarthritis, left hip: Secondary | ICD-10-CM | POA: Diagnosis not present

## 2019-07-26 DIAGNOSIS — I10 Essential (primary) hypertension: Secondary | ICD-10-CM | POA: Diagnosis not present

## 2019-07-26 DIAGNOSIS — Z9221 Personal history of antineoplastic chemotherapy: Secondary | ICD-10-CM | POA: Diagnosis not present

## 2019-07-26 DIAGNOSIS — E039 Hypothyroidism, unspecified: Secondary | ICD-10-CM | POA: Diagnosis not present

## 2019-07-26 LAB — BASIC METABOLIC PANEL
Anion gap: 6 (ref 5–15)
BUN: 22 mg/dL (ref 8–23)
CO2: 29 mmol/L (ref 22–32)
Calcium: 8.1 mg/dL — ABNORMAL LOW (ref 8.9–10.3)
Chloride: 102 mmol/L (ref 98–111)
Creatinine, Ser: 0.82 mg/dL (ref 0.44–1.00)
GFR calc Af Amer: >60 mL/min (ref >60)
GFR calc non Af Amer: >60 mL/min (ref >60)
Glucose, Bld: 126 mg/dL — ABNORMAL HIGH (ref 70–99)
Potassium: 3.9 mmol/L (ref 3.5–5.1)
Sodium: 137 mmol/L (ref 135–145)

## 2019-07-26 LAB — CBC
HCT: 31.4 % — ABNORMAL LOW (ref 36.0–46.0)
Hemoglobin: 10.2 g/dL — ABNORMAL LOW (ref 12.0–15.0)
MCH: 32.4 pg (ref 26.0–34.0)
MCHC: 32.5 g/dL (ref 30.0–36.0)
MCV: 99.7 fL (ref 80.0–100.0)
Platelets: 176 10*3/uL (ref 150–400)
RBC: 3.15 MIL/uL — ABNORMAL LOW (ref 3.87–5.11)
RDW: 13.6 % (ref 11.5–15.5)
WBC: 12.1 10*3/uL — ABNORMAL HIGH (ref 4.0–10.5)
nRBC: 0 % (ref 0.0–0.2)

## 2019-07-26 MED ORDER — SENNA 8.6 MG PO TABS: 2.0000 | ORAL_TABLET | Freq: Every day | ORAL | 1 refills | Status: AC

## 2019-07-26 MED ORDER — ONDANSETRON HCL 4 MG PO TABS: 4.0000 mg | ORAL_TABLET | Freq: Four times a day (QID) | ORAL | 0 refills | Status: DC | PRN

## 2019-07-26 MED ORDER — HYDROCODONE-ACETAMINOPHEN 5-325 MG PO TABS: 1.0000 | ORAL_TABLET | ORAL | 0 refills | Status: DC | PRN

## 2019-07-26 MED ORDER — ASPIRIN 81 MG PO CHEW: 81.0000 mg | CHEWABLE_TABLET | Freq: Two times a day (BID) | ORAL | 1 refills | Status: DC

## 2019-07-26 MED ORDER — DOCUSATE SODIUM 100 MG PO CAPS: 100.0000 mg | ORAL_CAPSULE | Freq: Two times a day (BID) | ORAL | 1 refills | Status: DC

## 2019-07-26 NOTE — Progress Notes (Signed)
Physical Therapy Treatment Patient Details Name: Karen [name] MRN: [id number] DOB: 12-Nov-1945 Today's Date: 07/26/2019    History of Present Illness Patient is 74 y.o. female s/p Lt THA anterior approach on 07/25/19 with PMH significant forbreast cancer s/p bilateral mastectomy, diverticulosis, GERD, thyroid dysfunction, HTN, OSA Rt THA in 2017.    PT Comments    Patient is POD 1 s/p Lt THA and is progressing well with therapy. Patient pain has been managed well and she reports no nausea this AM. Patient was able to increase ambulation distance with RW to ~200' and supervision level. She was instructed on safe stair management and performed single step up with RW and supervision for safety. Patient also instructed in exercise for HEP to facilitate ROM and circulation. She demonstrated competence with all exercises. Patient is safe for discharge home at this time, she will need a youth RW. Acute PT will continue to progress mobility throughout acute stay.   Follow Up Recommendations  Follow surgeon's recommendation for DC plan and follow-up therapies     Equipment Recommendations  Rolling walker with 5" wheels(may need youth vs regular, check in AM tomorrow)    Recommendations for Other Services       Precautions / Restrictions Precautions Precautions: Fall Restrictions Weight Bearing Restrictions: No LLE Weight Bearing: Weight bearing as tolerated    Mobility  Bed Mobility        General bed mobility comments: pt OOB in recliner at start of session  Transfers Overall transfer level: Needs assistance Equipment used: Rolling walker (2 wheeled) Transfers: Sit to/from Stand Sit to Stand: Supervision         General transfer comment: pt with good safety and carryover for hand placement and technique with RW. no assist required.  Ambulation/Gait Ambulation/Gait assistance: Supervision Gait Distance (Feet): 200 Feet Assistive device: Rolling walker (2 wheeled) Gait  Pattern/deviations: Step-through pattern;Decreased stride length Gait velocity: WNLs   General Gait Details: pt with good safety awareness and maintained safe proximity and correct hand placement with RW. no overt LOB. pt with more normalized gait and no antalgia noted.   Stairs Stairs: Yes Stairs assistance: Supervision Stair Management: No rails;With walker;Step to pattern;Forwards Number of Stairs: 1 General stair comments: pt performed singel step up with forward technique using RW, verbal cues for safe step sequencing "up with good, down with bad". pt steady throughout and performed safe step pattern.   Wheelchair Mobility    Modified Rankin (Stroke Patients Only)       Balance Overall balance assessment: Needs assistance Sitting-balance support: Feet supported Sitting balance-Leahy Scale: Good     Standing balance support: During functional activity;Bilateral upper extremity supported Standing balance-Leahy Scale: Fair           Cognition Arousal/Alertness: Awake/alert Behavior During Therapy: WFL for tasks assessed/performed Overall Cognitive Status: Within Functional Limits for tasks assessed       Exercises Total Joint Exercises Ankle Circles/Pumps: AROM;10 reps;Seated;Both Quad Sets: AROM;5 reps;Seated;Both Short Arc Quad: AROM;5 reps;Seated;Left Heel Slides: AROM;5 reps;Seated;Left Hip ABduction/ADduction: AROM;5 reps;Seated;Left Long Arc Quad: AROM;5 reps;Seated;Both Knee Flexion: AROM;5 reps;Standing;Left Other Exercises Other Exercises: Educated on standing exercises for AROM: marching, hip abduction, and hip extension. educated o perform at [employer] for support    General Comments        Pertinent Vitals/Pain Pain Assessment: No/denies pain Pain Location: Lt hip Pain Descriptors / Indicators: Aching;Burning Pain Intervention(s): Limited activity within patient's tolerance;Monitored during session;Repositioned  PT Goals  (current goals can now be found in the care plan section) Acute Rehab PT Goals Patient Stated Goal: to get back to walking for exercise PT Goal Formulation: With patient Time For Goal Achievement: 08/01/19 Potential to Achieve Goals: Good Progress towards PT goals: Progressing toward goals    Frequency    7X/week      PT Plan Current plan remains appropriate       AM-PAC PT "6 Clicks" Mobility   Outcome Measure  Help needed turning from your back to your side while in a flat bed without using bedrails?: None Help needed moving from lying on your back to sitting on the side of a flat bed without using bedrails?: A Little Help needed moving to and from a bed to a chair (including a wheelchair)?: A Little Help needed standing up from a chair using your arms (e.g., wheelchair or bedside chair)?: A Little Help needed to walk in hospital room?: A Little Help needed climbing 3-5 steps with a railing? : A Little 6 Click Score: 19    End of Session Equipment Utilized During Treatment: Gait belt Activity Tolerance: Patient tolerated treatment well Patient left: with call bell/phone within reach;in chair;with chair alarm set Nurse Communication: Mobility status PT Visit Diagnosis: Muscle weakness (generalized) (M62.81);Difficulty in walking, not elsewhere classified (R26.2)     Time: [id number] PT Time Calculation (min) (ACUTE ONLY): 28 min  Charges:  $Gait Training: 8-22 mins $Therapeutic Exercise: 8-22 mins                     Verner Mould, DPT Physical Therapist with [facility] [phone]  07/26/2019 10:35 AM

## 2019-07-26 NOTE — Progress Notes (Signed)
Therapy Plan: HEP Dme: RW ordered and delivered to the patient room by [employer].

## 2019-07-26 NOTE — Discharge Summary (Signed)
Physician Discharge Summary  Patient ID: Karen [name] MRN: [id number] DOB/AGE: May 08, 1946 74 y.o.  Admit date: 07/25/2019 Discharge date: 07/26/2019  Admission Diagnoses:  Primary osteoarthritis of left hip  Discharge Diagnoses:  Principal Problem:   Primary osteoarthritis of left hip Active Problems:   Osteoarthritis of left hip   Past Medical History:  Diagnosis Date  . Arthritis    right hip  . Breast cancer [facility]) 2004   bilateral mastectomy-all left lymph nodes removed; chemo. Patient states she had "spot" on the right breast as well.  . Diverticulitis   . GERD (gastroesophageal reflux disease)    since gallbladder removed, no more GERD  . Goiter   . HTN (hypertension)   . Hypothyroidism   . Mental disorder    Major depressive disorder; generalized anxiety disorder; mood disorder  . Pneumonia   . Sleep apnea    has never been tested.  was dx with cancer first and had this problem on the back burner  . Tubular adenoma     Surgeries: Procedure(s): TOTAL HIP ARTHROPLASTY ANTERIOR APPROACH on 07/25/2019   Consultants (if any):   Discharged Condition: Improved  Hospital Course: JANNINE ARIA is an 74 y.o. female who was admitted 07/25/2019 with a diagnosis of Primary osteoarthritis of left hip and went to the operating room on 07/25/2019 and underwent the above named procedures.    She was given perioperative antibiotics:  Anti-infectives (From admission, onward)   Start     Dose/Rate Route Frequency Ordered Stop   07/25/19 1400  ceFAZolin (ANCEF) IVPB 2g/100 mL premix     2 g 200 mL/hr over 30 Minutes Intravenous Every 6 hours 07/25/19 1200 07/25/19 2107   07/25/19 0600  ceFAZolin (ANCEF) IVPB 2g/100 mL premix     2 g 200 mL/hr over 30 Minutes Intravenous On call to O.R. 07/25/19 [id number] 07/25/19 0805    .  She was given sequential compression devices, early ambulation, and ASA for DVT prophylaxis.  She benefited maximally from the hospital stay and there were no  complications.    Recent vital signs:  Vitals:   07/26/19 0807 07/26/19 0929  BP: 117/63 106/63  Pulse: 72 81  Resp:  18  Temp:  98.3 F (36.8 C)  SpO2:  92%    Recent laboratory studies:  Lab Results  Component Value Date   HGB 10.2 (L) 07/26/2019   HGB 12.9 07/17/2019   HGB 9.3 (L) 03/02/2016   Lab Results  Component Value Date   WBC 12.1 (H) 07/26/2019   PLT 176 07/26/2019   Lab Results  Component Value Date   INR 0.9 07/17/2019   Lab Results  Component Value Date   NA 137 07/26/2019   K 3.9 07/26/2019   CL 102 07/26/2019   CO2 29 07/26/2019   BUN 22 07/26/2019   CREATININE 0.82 07/26/2019   GLUCOSE 126 (H) 07/26/2019    Discharge Medications:   Allergies as of 07/26/2019      Reactions   Sulfonamide Derivatives Anaphylaxis   Morphine And Related Swelling   SWELLING REACTION UNSPECIFIED       Medication List    STOP taking these medications   acetaminophen 500 MG tablet Commonly known as: TYLENOL   aspirin EC 81 MG tablet Replaced by: aspirin 81 MG chewable tablet     TAKE these medications   ALPRAZolam 1 MG tablet Commonly known as: XANAX Take 1 tablet (1 mg total) by mouth 3 (three) times daily as needed for  anxiety.   aspirin 81 MG chewable tablet Chew 1 tablet (81 mg total) by mouth 2 (two) times daily. Replaces: aspirin EC 81 MG tablet   docusate sodium 100 MG capsule Commonly known as: COLACE Take 1 capsule (100 mg total) by mouth 2 (two) times daily.   HYDROcodone-acetaminophen 5-325 MG tablet Commonly known as: NORCO/VICODIN Take 1-2 tablets by mouth every 4 (four) hours as needed for moderate pain (pain score 4-6).   ibuprofen 200 MG tablet Commonly known as: ADVIL Take 400 mg by mouth every 6 (six) hours as needed (hip pain.).   levothyroxine 100 MCG tablet Commonly known as: SYNTHROID Take 100 mcg by mouth daily before breakfast.   olmesartan-hydrochlorothiazide 40-25 MG tablet Commonly known as: BENICAR HCT Take 1  tablet by mouth daily.   ondansetron 4 MG tablet Commonly known as: ZOFRAN Take 1 tablet (4 mg total) by mouth every 6 (six) hours as needed for nausea.   senna 8.6 MG Tabs tablet Commonly known as: SENOKOT Take 2 tablets (17.2 mg total) by mouth at bedtime.       Diagnostic Studies: DG Pelvis Portable  Result Date: 07/25/2019 CLINICAL DATA:  Postop check following left total hip arthroplasty. EXAM: PORTABLE PELVIS 1-2 VIEWS COMPARISON:  02/29/2016 FINDINGS: Sequelae of recent left total hip arthroplasty are identified with postoperative gas in the surrounding soft tissues. A remote right total hip arthroplasty is again noted. No fracture or dislocation is evident on this single projection. Phleboliths are noted in the pelvis. IMPRESSION: Recent left total hip arthroplasty without evidence of acute osseous abnormality. Electronically Signed   By: Logan Bores M.D.   On: 07/25/2019 11:01   DG C-Arm 1-60 Min-No Report  Result Date: 07/25/2019 Fluoroscopy was utilized by the requesting physician.  No radiographic interpretation.   DG HIP OPERATIVE UNILAT W OR W/O PELVIS LEFT  Result Date: 07/25/2019 CLINICAL DATA:  Left hip replacement. EXAM: OPERATIVE LEFT HIP (WITH PELVIS IF PERFORMED) ONE VIEW TECHNIQUE: Fluoroscopic spot image(s) were submitted for interpretation post-operatively. COMPARISON:  02/29/2016 FINDINGS: Again noted is a right hip replacement. Initial images demonstrate joint space narrowing and degenerative changes in left hip joint. Patient underwent a left hip arthroplasty. The left hip arthroplasty appears located on this single view. IMPRESSION: Intraoperative imaging for left hip replacement. No complicating features. Electronically Signed   By: Markus Daft M.D.   On: 07/25/2019 09:44    Disposition: Discharge disposition: 01-Home or Self Care       Discharge Instructions    Call MD / Call 911   Complete by: As directed    If you experience chest pain or shortness  of breath, CALL 911 and be transported to the hospital emergency room.  If you develope a fever above 101 F, pus (white drainage) or increased drainage or redness at the wound, or calf pain, call your surgeon's office.   Constipation Prevention   Complete by: As directed    Drink plenty of fluids.  Prune juice may be helpful.  You may use a stool softener, such as Colace (over the counter) 100 mg twice a day.  Use MiraLax (over the counter) for constipation as needed.   Diet - low sodium heart healthy   Complete by: As directed    Driving restrictions   Complete by: As directed    No driving for 6 weeks   Increase activity slowly as tolerated   Complete by: As directed    Lifting restrictions   Complete by: As directed  No lifting for 6 weeks   TED hose   Complete by: As directed    Use stockings (TED hose) for 2 weeks on both leg(s).  You may remove them at night for sleeping.      Follow-up Information    Jenavie Stanczak, Aaron Edelman, MD. Schedule an appointment as soon as possible for a visit in 2 weeks.   Specialty: Orthopedic Surgery Why: For wound re-check Contact information: [address] [place] [address] [id number]            Signed: Hilton Cork Jancy Sprankle 07/26/2019, 1:46 PM

## 2019-07-26 NOTE — Progress Notes (Signed)
Pt provided with d/c instructions. After discussing the pt's plan of care upon d/c home, the pt reported no further questions or concerns.  

## 2019-07-29 DIAGNOSIS — C50919 Malignant neoplasm of unspecified site of unspecified female breast: Secondary | ICD-10-CM | POA: Diagnosis not present

## 2019-07-29 DIAGNOSIS — E039 Hypothyroidism, unspecified: Secondary | ICD-10-CM | POA: Diagnosis not present

## 2019-07-29 DIAGNOSIS — I10 Essential (primary) hypertension: Secondary | ICD-10-CM | POA: Diagnosis not present

## 2019-08-09 DIAGNOSIS — Z96642 Presence of left artificial hip joint: Secondary | ICD-10-CM | POA: Diagnosis not present

## 2019-08-09 DIAGNOSIS — Z471 Aftercare following joint replacement surgery: Secondary | ICD-10-CM | POA: Diagnosis not present

## 2019-08-21 DIAGNOSIS — I10 Essential (primary) hypertension: Secondary | ICD-10-CM | POA: Diagnosis not present

## 2019-08-21 DIAGNOSIS — C50919 Malignant neoplasm of unspecified site of unspecified female breast: Secondary | ICD-10-CM | POA: Diagnosis not present

## 2019-09-09 DIAGNOSIS — Z96642 Presence of left artificial hip joint: Secondary | ICD-10-CM | POA: Diagnosis not present

## 2019-09-09 DIAGNOSIS — Z471 Aftercare following joint replacement surgery: Secondary | ICD-10-CM | POA: Diagnosis not present

## 2019-10-17 DIAGNOSIS — I10 Essential (primary) hypertension: Secondary | ICD-10-CM | POA: Diagnosis not present

## 2019-10-17 DIAGNOSIS — E039 Hypothyroidism, unspecified: Secondary | ICD-10-CM | POA: Diagnosis not present

## 2019-10-21 DIAGNOSIS — S60562A Insect bite (nonvenomous) of left hand, initial encounter: Secondary | ICD-10-CM | POA: Diagnosis not present

## 2019-11-14 DIAGNOSIS — I10 Essential (primary) hypertension: Secondary | ICD-10-CM | POA: Diagnosis not present

## 2019-11-14 DIAGNOSIS — E039 Hypothyroidism, unspecified: Secondary | ICD-10-CM | POA: Diagnosis not present

## 2019-11-14 DIAGNOSIS — C50919 Malignant neoplasm of unspecified site of unspecified female breast: Secondary | ICD-10-CM | POA: Diagnosis not present

## 2019-11-26 DIAGNOSIS — E876 Hypokalemia: Secondary | ICD-10-CM | POA: Diagnosis not present

## 2019-11-26 DIAGNOSIS — R7303 Prediabetes: Secondary | ICD-10-CM | POA: Diagnosis not present

## 2019-11-26 DIAGNOSIS — R7301 Impaired fasting glucose: Secondary | ICD-10-CM | POA: Diagnosis not present

## 2019-11-26 DIAGNOSIS — E039 Hypothyroidism, unspecified: Secondary | ICD-10-CM | POA: Diagnosis not present

## 2019-11-26 DIAGNOSIS — C50919 Malignant neoplasm of unspecified site of unspecified female breast: Secondary | ICD-10-CM | POA: Diagnosis not present

## 2019-12-02 DIAGNOSIS — I10 Essential (primary) hypertension: Secondary | ICD-10-CM | POA: Diagnosis not present

## 2019-12-02 DIAGNOSIS — R7303 Prediabetes: Secondary | ICD-10-CM | POA: Diagnosis not present

## 2019-12-02 DIAGNOSIS — E039 Hypothyroidism, unspecified: Secondary | ICD-10-CM | POA: Diagnosis not present

## 2019-12-02 DIAGNOSIS — M25552 Pain in left hip: Secondary | ICD-10-CM | POA: Diagnosis not present

## 2020-01-10 DIAGNOSIS — I10 Essential (primary) hypertension: Secondary | ICD-10-CM | POA: Diagnosis not present

## 2020-01-10 DIAGNOSIS — C50919 Malignant neoplasm of unspecified site of unspecified female breast: Secondary | ICD-10-CM | POA: Diagnosis not present

## 2020-01-10 DIAGNOSIS — E039 Hypothyroidism, unspecified: Secondary | ICD-10-CM | POA: Diagnosis not present

## 2020-01-13 DIAGNOSIS — M7062 Trochanteric bursitis, left hip: Secondary | ICD-10-CM | POA: Diagnosis not present

## 2020-02-03 DIAGNOSIS — C50919 Malignant neoplasm of unspecified site of unspecified female breast: Secondary | ICD-10-CM | POA: Diagnosis not present

## 2020-02-03 DIAGNOSIS — E039 Hypothyroidism, unspecified: Secondary | ICD-10-CM | POA: Diagnosis not present

## 2020-02-03 DIAGNOSIS — I10 Essential (primary) hypertension: Secondary | ICD-10-CM | POA: Diagnosis not present

## 2020-02-13 DIAGNOSIS — J069 Acute upper respiratory infection, unspecified: Secondary | ICD-10-CM | POA: Diagnosis not present

## 2020-02-13 DIAGNOSIS — H6501 Acute serous otitis media, right ear: Secondary | ICD-10-CM | POA: Diagnosis not present

## 2020-03-10 DIAGNOSIS — C50919 Malignant neoplasm of unspecified site of unspecified female breast: Secondary | ICD-10-CM | POA: Diagnosis not present

## 2020-03-10 DIAGNOSIS — I10 Essential (primary) hypertension: Secondary | ICD-10-CM | POA: Diagnosis not present

## 2020-03-10 DIAGNOSIS — E039 Hypothyroidism, unspecified: Secondary | ICD-10-CM | POA: Diagnosis not present

## 2020-04-01 DIAGNOSIS — E039 Hypothyroidism, unspecified: Secondary | ICD-10-CM | POA: Diagnosis not present

## 2020-04-01 DIAGNOSIS — C50919 Malignant neoplasm of unspecified site of unspecified female breast: Secondary | ICD-10-CM | POA: Diagnosis not present

## 2020-04-01 DIAGNOSIS — I10 Essential (primary) hypertension: Secondary | ICD-10-CM | POA: Diagnosis not present

## 2020-04-07 DIAGNOSIS — E039 Hypothyroidism, unspecified: Secondary | ICD-10-CM | POA: Diagnosis not present

## 2020-04-07 DIAGNOSIS — G5691 Unspecified mononeuropathy of right upper limb: Secondary | ICD-10-CM | POA: Diagnosis not present

## 2020-04-07 DIAGNOSIS — R7301 Impaired fasting glucose: Secondary | ICD-10-CM | POA: Diagnosis not present

## 2020-04-07 DIAGNOSIS — I10 Essential (primary) hypertension: Secondary | ICD-10-CM | POA: Diagnosis not present

## 2020-04-14 DIAGNOSIS — I10 Essential (primary) hypertension: Secondary | ICD-10-CM | POA: Diagnosis not present

## 2020-04-14 DIAGNOSIS — E039 Hypothyroidism, unspecified: Secondary | ICD-10-CM | POA: Diagnosis not present

## 2020-04-14 DIAGNOSIS — R7303 Prediabetes: Secondary | ICD-10-CM | POA: Diagnosis not present

## 2020-04-14 DIAGNOSIS — R945 Abnormal results of liver function studies: Secondary | ICD-10-CM | POA: Diagnosis not present

## 2020-04-30 DIAGNOSIS — C50919 Malignant neoplasm of unspecified site of unspecified female breast: Secondary | ICD-10-CM | POA: Diagnosis not present

## 2020-04-30 DIAGNOSIS — I10 Essential (primary) hypertension: Secondary | ICD-10-CM | POA: Diagnosis not present

## 2020-04-30 DIAGNOSIS — E039 Hypothyroidism, unspecified: Secondary | ICD-10-CM | POA: Diagnosis not present

## 2020-06-12 DIAGNOSIS — C50919 Malignant neoplasm of unspecified site of unspecified female breast: Secondary | ICD-10-CM | POA: Diagnosis not present

## 2020-06-12 DIAGNOSIS — E039 Hypothyroidism, unspecified: Secondary | ICD-10-CM | POA: Diagnosis not present

## 2020-06-12 DIAGNOSIS — I10 Essential (primary) hypertension: Secondary | ICD-10-CM | POA: Diagnosis not present

## 2020-07-11 DIAGNOSIS — C50919 Malignant neoplasm of unspecified site of unspecified female breast: Secondary | ICD-10-CM | POA: Diagnosis not present

## 2020-07-11 DIAGNOSIS — E039 Hypothyroidism, unspecified: Secondary | ICD-10-CM | POA: Diagnosis not present

## 2020-07-11 DIAGNOSIS — Z20822 Contact with and (suspected) exposure to covid-19: Secondary | ICD-10-CM | POA: Diagnosis not present

## 2020-07-11 DIAGNOSIS — I10 Essential (primary) hypertension: Secondary | ICD-10-CM | POA: Diagnosis not present

## 2020-07-13 DIAGNOSIS — J039 Acute tonsillitis, unspecified: Secondary | ICD-10-CM | POA: Diagnosis not present

## 2020-07-13 DIAGNOSIS — J029 Acute pharyngitis, unspecified: Secondary | ICD-10-CM | POA: Diagnosis not present

## 2020-09-09 DIAGNOSIS — I10 Essential (primary) hypertension: Secondary | ICD-10-CM | POA: Diagnosis not present

## 2020-09-09 DIAGNOSIS — E039 Hypothyroidism, unspecified: Secondary | ICD-10-CM | POA: Diagnosis not present

## 2020-10-11 DIAGNOSIS — I10 Essential (primary) hypertension: Secondary | ICD-10-CM | POA: Diagnosis not present

## 2020-10-11 DIAGNOSIS — K219 Gastro-esophageal reflux disease without esophagitis: Secondary | ICD-10-CM | POA: Diagnosis not present

## 2020-10-26 DIAGNOSIS — R7301 Impaired fasting glucose: Secondary | ICD-10-CM | POA: Diagnosis not present

## 2020-10-26 DIAGNOSIS — G5691 Unspecified mononeuropathy of right upper limb: Secondary | ICD-10-CM | POA: Diagnosis not present

## 2020-10-26 DIAGNOSIS — I10 Essential (primary) hypertension: Secondary | ICD-10-CM | POA: Diagnosis not present

## 2020-10-26 DIAGNOSIS — E782 Mixed hyperlipidemia: Secondary | ICD-10-CM | POA: Diagnosis not present

## 2020-10-26 DIAGNOSIS — E039 Hypothyroidism, unspecified: Secondary | ICD-10-CM | POA: Diagnosis not present

## 2020-10-30 ENCOUNTER — Emergency Department (HOSPITAL_COMMUNITY): Payer: Medicare Other

## 2020-10-30 ENCOUNTER — Encounter (HOSPITAL_COMMUNITY): Payer: Self-pay | Admitting: *Deleted

## 2020-10-30 ENCOUNTER — Other Ambulatory Visit: Payer: Self-pay

## 2020-10-30 ENCOUNTER — Inpatient Hospital Stay (HOSPITAL_COMMUNITY)
Admission: EM | Admit: 2020-10-30 | Discharge: 2020-11-01 | DRG: 177 | Disposition: A | Discharge status: Home / Self Care | Payer: Medicare Other | Attending: Internal Medicine | Admitting: Internal Medicine

## 2020-10-30 DIAGNOSIS — F411 Generalized anxiety disorder: Secondary | ICD-10-CM | POA: Diagnosis present

## 2020-10-30 DIAGNOSIS — Z96643 Presence of artificial hip joint, bilateral: Secondary | ICD-10-CM | POA: Diagnosis present

## 2020-10-30 DIAGNOSIS — R1084 Generalized abdominal pain: Secondary | ICD-10-CM

## 2020-10-30 DIAGNOSIS — Z853 Personal history of malignant neoplasm of breast: Secondary | ICD-10-CM

## 2020-10-30 DIAGNOSIS — Z87891 Personal history of nicotine dependence: Secondary | ICD-10-CM | POA: Diagnosis not present

## 2020-10-30 DIAGNOSIS — E669 Obesity, unspecified: Secondary | ICD-10-CM | POA: Diagnosis present

## 2020-10-30 DIAGNOSIS — E876 Hypokalemia: Secondary | ICD-10-CM | POA: Diagnosis not present

## 2020-10-30 DIAGNOSIS — I10 Essential (primary) hypertension: Secondary | ICD-10-CM | POA: Diagnosis present

## 2020-10-30 DIAGNOSIS — Z79890 Hormone replacement therapy (postmenopausal): Secondary | ICD-10-CM | POA: Diagnosis not present

## 2020-10-30 DIAGNOSIS — R0602 Shortness of breath: Secondary | ICD-10-CM

## 2020-10-30 DIAGNOSIS — J1282 Pneumonia due to coronavirus disease 2019: Secondary | ICD-10-CM | POA: Diagnosis present

## 2020-10-30 DIAGNOSIS — Z7982 Long term (current) use of aspirin: Secondary | ICD-10-CM | POA: Diagnosis not present

## 2020-10-30 DIAGNOSIS — E039 Hypothyroidism, unspecified: Secondary | ICD-10-CM | POA: Diagnosis present

## 2020-10-30 DIAGNOSIS — Z6836 Body mass index (BMI) 36.0-36.9, adult: Secondary | ICD-10-CM

## 2020-10-30 DIAGNOSIS — K439 Ventral hernia without obstruction or gangrene: Secondary | ICD-10-CM | POA: Diagnosis not present

## 2020-10-30 DIAGNOSIS — Z800 Family history of malignant neoplasm of digestive organs: Secondary | ICD-10-CM

## 2020-10-30 DIAGNOSIS — K219 Gastro-esophageal reflux disease without esophagitis: Secondary | ICD-10-CM | POA: Diagnosis present

## 2020-10-30 DIAGNOSIS — J9601 Acute respiratory failure with hypoxia: Secondary | ICD-10-CM | POA: Diagnosis present

## 2020-10-30 DIAGNOSIS — Z9013 Acquired absence of bilateral breasts and nipples: Secondary | ICD-10-CM | POA: Diagnosis not present

## 2020-10-30 DIAGNOSIS — K429 Umbilical hernia without obstruction or gangrene: Secondary | ICD-10-CM | POA: Diagnosis not present

## 2020-10-30 DIAGNOSIS — K5750 Diverticulosis of both small and large intestine without perforation or abscess without bleeding: Secondary | ICD-10-CM | POA: Diagnosis not present

## 2020-10-30 DIAGNOSIS — U071 COVID-19: Principal | ICD-10-CM | POA: Diagnosis present

## 2020-10-30 DIAGNOSIS — Z79899 Other long term (current) drug therapy: Secondary | ICD-10-CM | POA: Diagnosis not present

## 2020-10-30 DIAGNOSIS — K469 Unspecified abdominal hernia without obstruction or gangrene: Secondary | ICD-10-CM | POA: Diagnosis not present

## 2020-10-30 DIAGNOSIS — J9811 Atelectasis: Secondary | ICD-10-CM | POA: Diagnosis not present

## 2020-10-30 DIAGNOSIS — Z90710 Acquired absence of both cervix and uterus: Secondary | ICD-10-CM

## 2020-10-30 DIAGNOSIS — Z803 Family history of malignant neoplasm of breast: Secondary | ICD-10-CM | POA: Diagnosis not present

## 2020-10-30 LAB — CBC WITH DIFFERENTIAL/PLATELET
Abs Immature Granulocytes: 0.06 10*3/uL (ref 0.00–0.07)
Basophils Absolute: 0 10*3/uL (ref 0.0–0.1)
Basophils Relative: 1 %
Eosinophils Absolute: 0 10*3/uL (ref 0.0–0.5)
Eosinophils Relative: 0 %
HCT: 42.8 % (ref 36.0–46.0)
Hemoglobin: 14 g/dL (ref 12.0–15.0)
Immature Granulocytes: 1 %
Lymphocytes Relative: 12 %
Lymphs Abs: 1 10*3/uL (ref 0.7–4.0)
MCH: 32.3 pg (ref 26.0–34.0)
MCHC: 32.7 g/dL (ref 30.0–36.0)
MCV: 98.6 fL (ref 80.0–100.0)
Monocytes Absolute: 1.1 10*3/uL — ABNORMAL HIGH (ref 0.1–1.0)
Monocytes Relative: 13 %
Neutro Abs: 6.2 10*3/uL (ref 1.7–7.7)
Neutrophils Relative %: 73 %
Platelets: 199 10*3/uL (ref 150–400)
RBC: 4.34 MIL/uL (ref 3.87–5.11)
RDW: 14.5 % (ref 11.5–15.5)
WBC: 8.4 10*3/uL (ref 4.0–10.5)
nRBC: 0 % (ref 0.0–0.2)

## 2020-10-30 LAB — COMPREHENSIVE METABOLIC PANEL
ALT: 29 U/L (ref 0–44)
AST: 39 U/L (ref 15–41)
Albumin: 4 g/dL (ref 3.5–5.0)
Alkaline Phosphatase: 100 U/L (ref 38–126)
Anion gap: 10 (ref 5–15)
BUN: 18 mg/dL (ref 8–23)
CO2: 29 mmol/L (ref 22–32)
Calcium: 8.9 mg/dL (ref 8.9–10.3)
Chloride: 98 mmol/L (ref 98–111)
Creatinine, Ser: 1.05 mg/dL — ABNORMAL HIGH (ref 0.44–1.00)
GFR, Estimated: 55 mL/min — ABNORMAL LOW (ref >60)
Glucose, Bld: 128 mg/dL — ABNORMAL HIGH (ref 70–99)
Potassium: 3 mmol/L — ABNORMAL LOW (ref 3.5–5.1)
Sodium: 137 mmol/L (ref 135–145)
Total Bilirubin: 0.6 mg/dL (ref 0.3–1.2)
Total Protein: 7.9 g/dL (ref 6.5–8.1)

## 2020-10-30 LAB — MAGNESIUM: Magnesium: 1.9 mg/dL (ref 1.7–2.4)

## 2020-10-30 LAB — RESP PANEL BY RT-PCR (FLU A&B, COVID) ARPGX2
Influenza A by PCR: NEGATIVE
Influenza B by PCR: NEGATIVE
SARS Coronavirus 2 by RT PCR: POSITIVE — AB

## 2020-10-30 LAB — C-REACTIVE PROTEIN: CRP: 1.4 mg/dL — ABNORMAL HIGH (ref <1.0)

## 2020-10-30 LAB — LACTIC ACID, PLASMA
Lactic Acid, Venous: 1.4 mmol/L (ref 0.5–1.9)
Lactic Acid, Venous: 1.1 mmol/L (ref 0.5–1.9)

## 2020-10-30 LAB — FERRITIN: Ferritin: 123 ng/mL (ref 11–307)

## 2020-10-30 LAB — FIBRINOGEN: Fibrinogen: 544 mg/dL — ABNORMAL HIGH (ref 210–475)

## 2020-10-30 LAB — URINALYSIS, ROUTINE W REFLEX MICROSCOPIC
Bilirubin Urine: NEGATIVE
Glucose, UA: NEGATIVE mg/dL
Hgb urine dipstick: NEGATIVE
Ketones, ur: NEGATIVE mg/dL
Leukocytes,Ua: NEGATIVE
Nitrite: NEGATIVE
Protein, ur: NEGATIVE mg/dL
Specific Gravity, Urine: 1.021 (ref 1.005–1.030)
pH: 5 (ref 5.0–8.0)

## 2020-10-30 LAB — D-DIMER, QUANTITATIVE: D-Dimer, Quant: 1.2 ug/mL-FEU — ABNORMAL HIGH (ref 0.00–0.50)

## 2020-10-30 LAB — TRIGLYCERIDES: Triglycerides: 105 mg/dL (ref <150)

## 2020-10-30 LAB — LACTATE DEHYDROGENASE: LDH: 148 U/L (ref 98–192)

## 2020-10-30 LAB — PROCALCITONIN: Procalcitonin: <0.1 ng/mL

## 2020-10-30 LAB — LIPASE, BLOOD: Lipase: 35 U/L (ref 11–51)

## 2020-10-30 MED ORDER — OLMESARTAN MEDOXOMIL-HCTZ 40-25 MG PO TABS: 1.0000 | ORAL_TABLET | Freq: Every day | ORAL | Status: DC

## 2020-10-30 MED ORDER — ACETAMINOPHEN 325 MG PO TABS
650.0000 mg | ORAL_TABLET | Freq: Once | ORAL | Status: AC
  Administered 2020-10-30: 650 mg via ORAL
  Filled 2020-10-30: qty 2

## 2020-10-30 MED ORDER — DEXAMETHASONE SODIUM PHOSPHATE 10 MG/ML IJ SOLN: 8.0000 mg | Freq: Once | INTRAMUSCULAR | Status: DC

## 2020-10-30 MED ORDER — DEXAMETHASONE 4 MG PO TABS
6.0000 mg | ORAL_TABLET | Freq: Every day | ORAL | Status: DC
  Administered 2020-10-31 – 2020-11-01 (×2): 6 mg via ORAL
  Filled 2020-10-30 (×2): qty 2

## 2020-10-30 MED ORDER — IPRATROPIUM BROMIDE 0.02 % IN SOLN: 0.5000 mg | Freq: Once | RESPIRATORY_TRACT | Status: DC

## 2020-10-30 MED ORDER — IRBESARTAN 150 MG PO TABS
300.0000 mg | ORAL_TABLET | Freq: Every day | ORAL | Status: DC
  Administered 2020-10-31 – 2020-11-01 (×2): 300 mg via ORAL
  Filled 2020-10-30 (×2): qty 2

## 2020-10-30 MED ORDER — HYDROCHLOROTHIAZIDE 25 MG PO TABS
25.0000 mg | ORAL_TABLET | Freq: Every day | ORAL | Status: DC
  Administered 2020-10-31 – 2020-11-01 (×2): 25 mg via ORAL
  Filled 2020-10-30 (×2): qty 1

## 2020-10-30 MED ORDER — ZINC SULFATE 220 (50 ZN) MG PO CAPS
220.0000 mg | ORAL_CAPSULE | Freq: Every day | ORAL | Status: DC
  Administered 2020-10-30 – 2020-11-01 (×3): 220 mg via ORAL
  Filled 2020-10-30 (×3): qty 1

## 2020-10-30 MED ORDER — LEVOTHYROXINE SODIUM 100 MCG PO TABS
100.0000 ug | ORAL_TABLET | Freq: Every day | ORAL | Status: DC
  Administered 2020-10-31 – 2020-11-01 (×2): 100 ug via ORAL
  Filled 2020-10-30 (×2): qty 1

## 2020-10-30 MED ORDER — ENOXAPARIN SODIUM 40 MG/0.4ML IJ SOSY
40.0000 mg | PREFILLED_SYRINGE | INTRAMUSCULAR | Status: DC
  Administered 2020-10-30: 40 mg via SUBCUTANEOUS
  Filled 2020-10-30 (×2): qty 0.4

## 2020-10-30 MED ORDER — ASPIRIN 81 MG PO CHEW
81.0000 mg | CHEWABLE_TABLET | Freq: Two times a day (BID) | ORAL | Status: DC
  Administered 2020-10-31 – 2020-11-01 (×3): 81 mg via ORAL
  Filled 2020-10-30 (×4): qty 1

## 2020-10-30 MED ORDER — ALBUTEROL SULFATE HFA 108 (90 BASE) MCG/ACT IN AERS
4.0000 | INHALATION_SPRAY | Freq: Once | RESPIRATORY_TRACT | Status: AC
  Administered 2020-10-30: 4 via RESPIRATORY_TRACT
  Filled 2020-10-30: qty 6.7

## 2020-10-30 MED ORDER — ACETAMINOPHEN 325 MG PO TABS: 650.0000 mg | ORAL_TABLET | Freq: Four times a day (QID) | ORAL | Status: DC | PRN

## 2020-10-30 MED ORDER — ASCORBIC ACID 500 MG PO TABS
500.0000 mg | ORAL_TABLET | Freq: Every day | ORAL | Status: DC
  Administered 2020-10-30 – 2020-11-01 (×3): 500 mg via ORAL
  Filled 2020-10-30 (×3): qty 1

## 2020-10-30 MED ORDER — ALPRAZOLAM 1 MG PO TABS
1.0000 mg | ORAL_TABLET | Freq: Three times a day (TID) | ORAL | Status: DC | PRN
  Administered 2020-10-30 – 2020-10-31 (×2): 1 mg via ORAL
  Filled 2020-10-30 (×2): qty 1

## 2020-10-30 MED ORDER — SODIUM CHLORIDE 0.9 % IV SOLN
100.0000 mg | INTRAVENOUS | Status: AC
  Administered 2020-10-30 (×2): 100 mg via INTRAVENOUS
  Filled 2020-10-30 (×2): qty 20

## 2020-10-30 MED ORDER — IPRATROPIUM-ALBUTEROL 20-100 MCG/ACT IN AERS
1.0000 | INHALATION_SPRAY | Freq: Four times a day (QID) | RESPIRATORY_TRACT | Status: DC
  Administered 2020-10-30 – 2020-10-31 (×4): 1 via RESPIRATORY_TRACT
  Filled 2020-10-30: qty 4

## 2020-10-30 MED ORDER — HYDROCOD POLST-CPM POLST ER 10-8 MG/5ML PO SUER: 5.0000 mL | Freq: Two times a day (BID) | ORAL | Status: DC | PRN

## 2020-10-30 MED ORDER — ONDANSETRON HCL 4 MG PO TABS: 4.0000 mg | ORAL_TABLET | Freq: Four times a day (QID) | ORAL | Status: DC | PRN

## 2020-10-30 MED ORDER — IPRATROPIUM BROMIDE HFA 17 MCG/ACT IN AERS: 2.0000 | INHALATION_SPRAY | Freq: Once | RESPIRATORY_TRACT | Status: DC

## 2020-10-30 MED ORDER — GUAIFENESIN-DM 100-10 MG/5ML PO SYRP: 10.0000 mL | ORAL_SOLUTION | ORAL | Status: DC | PRN

## 2020-10-30 MED ORDER — SODIUM CHLORIDE 0.9 % IV SOLN
100.0000 mg | Freq: Every day | INTRAVENOUS | Status: DC
  Administered 2020-10-31 – 2020-11-01 (×2): 100 mg via INTRAVENOUS
  Filled 2020-10-30 (×3): qty 20

## 2020-10-30 MED ORDER — POTASSIUM CHLORIDE CRYS ER 20 MEQ PO TBCR
40.0000 meq | EXTENDED_RELEASE_TABLET | Freq: Once | ORAL | Status: AC
  Administered 2020-10-30: 40 meq via ORAL
  Filled 2020-10-30: qty 2

## 2020-10-30 MED ORDER — DEXAMETHASONE SODIUM PHOSPHATE 10 MG/ML IJ SOLN
6.0000 mg | Freq: Once | INTRAMUSCULAR | Status: AC
  Administered 2020-10-30: 6 mg via INTRAVENOUS
  Filled 2020-10-30: qty 1

## 2020-10-30 NOTE — Plan of Care (Signed)
  Problem: Education: Goal: Knowledge of General Education information will improve Description: Including pain rating scale, medication(s)/side effects and non-pharmacologic comfort measures Outcome: Progressing   Problem: Health Behavior/Discharge Planning: Goal: Ability to manage health-related needs will improve Outcome: Progressing   Problem: Education: Goal: Knowledge of risk factors and measures for prevention of condition will improve Outcome: Progressing   Problem: Coping: Goal: Psychosocial and spiritual needs will be supported Outcome: Progressing   

## 2020-10-30 NOTE — ED Notes (Signed)
Date and time results received: 10/30/20 2:07 PM  Test: Covid Critical Value: positive  Name of Provider Notified: wylder, PA  Orders Received? Or Actions Taken? See orders

## 2020-10-30 NOTE — ED Triage Notes (Signed)
Abdominal pain with diarrhea, also has shortness of breath

## 2020-10-30 NOTE — H&P (Signed)
History and Physical  [name] [id number] DOB: 09-11-1945 DOA: 10/30/2020  Referring physician: Pati Gallo, PA-C, EDP PCP: Celene Squibb, MD  Outpatient Specialists: none  Patient Coming From: home  Chief Complaint: SOB, diarrhea  HPI: Karen [name] is a 75 y.o. female with a history of hypothyroidism, hypertension, history of breast cancer status post bilateral mastectomy, GERD.  Patient seen for 2 days of worsening shortness of breath with an onset of fever yesterday and diarrhea this morning.  Her symptoms are worsening.  They are accompanied by a cough that is occasionally productive.  As her symptoms have been worsening, she presented to the hospital for evaluation.  No palliating or provoking factors, although she feels much better after steroids and inhaler treatment.  Emergency Department Course: Patient initially hypoxic to 86 on room air.  She was placed on 2 to 3 L nasal cannula with improvement of oxygenation.  COVID test was positive.  White count negative.  Chest x-ray negative.  Shows mild enteritis of the distal jejunum.  Review of Systems:   Pt denies any nausea, vomiting, constipation, abdominal pain, palpitations, headache, vision changes, lightheadedness, dizziness, melena, rectal bleeding.  Review of systems are otherwise negative  Past Medical History:  Diagnosis Date  . Arthritis    right hip  . Breast cancer [facility]) 2004   bilateral mastectomy-all left lymph nodes removed; chemo. Patient states she had "spot" on the right breast as well.  . Diverticulitis   . GERD (gastroesophageal reflux disease)    since gallbladder removed, no more GERD  . Goiter   . HTN (hypertension)   . Hypothyroidism   . Mental disorder    Major depressive disorder; generalized anxiety disorder; mood disorder  . Pneumonia   . Sleep apnea    has never been tested.  was dx with cancer first and had this problem on the back burner  . Tubular adenoma    Past Surgical History:   Procedure Laterality Date  . ABDOMINAL HYSTERECTOMY    . bilateral mastectomy w/reconstruction  2004  . BREAST SURGERY    . CHOLECYSTECTOMY    . COLONOSCOPY  2008   colon with few diverticula in sigmoid colon  . COLONOSCOPY  02/16/2011   Dr. Gala Romney- colon and rectal polyps= tubular adenoma, L sided diverticulosis.  . DEBRIDEMENT AND CLOSURE WOUND N/A 12/17/2014   Procedure: CLOSURE WOUND LUMBAR SPINE;  Surgeon: Melina Schools, MD;  Location: [place];  Service: Orthopedics;  Laterality: N/A;  . DILATION AND CURETTAGE OF UTERUS    . ESOPHAGOGASTRODUODENOSCOPY  2008   lipoma distal esophagus, gastritis   . ESOPHAGOGASTRODUODENOSCOPY  02/16/2011   Dr. Gala Romney- normal esophagus, small hiatal hernia o/w normal stomach. normal first and second portion of the duodenum  . HERNIA REPAIR     umbilical hernia was repaired  . INJECTION KNEE Right 11/07/2014   Procedure: KNEE INJECTION;  Surgeon: Melina Schools, MD;  Location: [place];  Service: Orthopedics;  Laterality: Right;  . LUMBAR LAMINECTOMY/DECOMPRESSION MICRODISCECTOMY N/A 09/25/2014   Procedure: DECOMPRESSION L3-L5 WITH INSERTION OF COFLEX   ( 2 LEVELS);  Surgeon: Melina Schools, MD;  Location: [place];  Service: Orthopedics;  Laterality: N/A;  . LUMBAR LAMINECTOMY/DECOMPRESSION MICRODISCECTOMY N/A 11/07/2014   Procedure: IRRIGATION AND DEBRIDEMENT BACK WOUND, PLACEMENT OF ANTIBIOTIC BEADS;  Surgeon: Melina Schools, MD;  Location: [place];  Service: Orthopedics;  Laterality: N/A;  . LUMBAR WOUND DEBRIDEMENT N/A 12/01/2014   Procedure: IRRIGATION AND DEBRIDEMENT OF LUMBAR WOUND WITH VAC PLACEMENT;  Surgeon: Melina Schools, MD;  Location: [place];  Service: Orthopedics;  Laterality: N/A;  . LUMBAR WOUND DEBRIDEMENT N/A 12/03/2014   Procedure: WOUND VAC DRESSING CHANGE ;  Surgeon: Melina Schools, MD;  Location: [place];  Service: Orthopedics;  Laterality: N/A;  Venia Minks DILATION  02/16/2011   Procedure: Venia Minks DILATION;  Surgeon: Daneil Dolin, MD;  [facility];  Service: Endoscopy;  Laterality: N/A;  . MASTECTOMY     bilateral 2004 left side had 17 nodes removed  . PARTIAL HYSTERECTOMY    . SAVORY DILATION  02/16/2011   Procedure: SAVORY DILATION;  Surgeon: Daneil Dolin, MD;  [facility];  Service: Endoscopy;  Laterality: N/A;  . TOTAL HIP ARTHROPLASTY Right 02/29/2016   Procedure: TOTAL HIP ARTHROPLASTY ANTERIOR APPROACH;  Surgeon: Rod Can, MD;  Location: [place];  Service: Orthopedics;  Laterality: Right;  . TOTAL HIP ARTHROPLASTY Left 07/25/2019   Procedure: TOTAL HIP ARTHROPLASTY ANTERIOR APPROACH;  Surgeon: Rod Can, MD;  Location: [facility];  Service: Orthopedics;  Laterality: Left;   Social History:  reports that she quit smoking about 25 years ago. She has a 20.00 pack-year smoking history. She has never used smokeless tobacco. She reports that she does not drink alcohol and does not use drugs. Patient lives at home  Allergies  Allergen Reactions  . Sulfonamide Derivatives Anaphylaxis  . Morphine And Related Swelling    SWELLING REACTION UNSPECIFIED     Family History  Problem Relation Age of Onset  . Pancreatic cancer Mother        deceased  . Aneurysm Father        deceased, aortic  . Breast cancer Sister        deceased 8 years after initial diagnosis  . Breast cancer Sister        survivor  . Diverticulitis Brother        s/p partial colon resection  . Suicidality Brother   . Cancer Son        lung  . Colon cancer Neg Hx      Prior to Admission medications   Medication Sig Start Date End Date Taking? Authorizing Provider  ALPRAZolam Duanne Moron) 1 MG tablet Take 1 tablet (1 mg total) by mouth 3 (three) times daily as needed for anxiety. 12/10/14   Estill Dooms, MD  aspirin 81 MG chewable tablet Chew 1 tablet (81 mg total) by mouth 2 (two) times daily. 07/26/19   Swinteck, Aaron Edelman, MD  docusate sodium (COLACE) 100 MG capsule Take 1 capsule (100 mg total) by mouth 2 (two) times daily. 07/26/19    Swinteck, Aaron Edelman, MD  HYDROcodone-acetaminophen (NORCO/VICODIN) 5-325 MG tablet Take 1-2 tablets by mouth every 4 (four) hours as needed for moderate pain (pain score 4-6). 07/26/19   Swinteck, Aaron Edelman, MD  ibuprofen (ADVIL) 200 MG tablet Take 400 mg by mouth every 6 (six) hours as needed (hip pain.).    [provider]  levothyroxine (SYNTHROID, LEVOTHROID) 100 MCG tablet Take 100 mcg by mouth daily before breakfast.  08/29/14   [provider]  olmesartan-hydrochlorothiazide (BENICAR HCT) 40-25 MG tablet Take 1 tablet by mouth daily. 05/30/19   [provider]  ondansetron (ZOFRAN) 4 MG tablet Take 1 tablet (4 mg total) by mouth every 6 (six) hours as needed for nausea. 07/26/19   Rod Can, MD    Physical Exam: BP 129/63   Pulse 74   Temp 99.2 F (37.3 C) (Oral)   Resp 20  SpO2 93%   . General: Elderly female. Awake and alert and oriented x3. No acute cardiopulmonary distress.  Marland Kitchen HEENT: Normocephalic atraumatic.  Right and left ears normal in appearance.  Pupils equal, round, reactive to light. Extraocular muscles are intact. Sclerae anicteric and noninjected.  Moist mucosal membranes. No mucosal lesions.  . Neck: Neck supple without lymphadenopathy. No carotid bruits. No masses palpated.  . Cardiovascular: Regular rate with normal S1-S2 sounds. No murmurs, rubs, gallops auscultated. No JVD.  Marland Kitchen Respiratory: Mild rales in the bases bilaterally.  Slight wheeze.  No accessory muscle use. . Abdomen: Soft, nontender, nondistended. Active bowel sounds. No masses or hepatosplenomegaly  . Skin: No rashes, lesions, or ulcerations.  Dry, warm to touch. 2+ dorsalis pedis and radial pulses. . Musculoskeletal: No calf or leg pain. All major joints not erythematous nontender.  No upper or lower joint deformation.  Good ROM.  No contractures  . Psychiatric: Intact judgment and insight. Pleasant and cooperative. . Neurologic: No focal neurological deficits. Strength is 5/5 and  symmetric in upper and lower extremities.  Cranial nerves II through XII are grossly intact.           Labs on Admission: I have personally reviewed following labs and imaging studies  CBC: Recent Labs  Lab 10/30/20 1304  WBC 8.4  NEUTROABS 6.2  HGB 14.0  HCT 42.8  MCV 98.6  PLT 786   Basic Metabolic Panel: Recent Labs  Lab 10/30/20 1304  NA 137  K 3.0*  CL 98  CO2 29  GLUCOSE 128*  BUN 18  CREATININE 1.05*  CALCIUM 8.9   GFR: CrCl cannot be calculated (Unknown ideal weight.). Liver Function Tests: Recent Labs  Lab 10/30/20 1304  AST 39  ALT 29  ALKPHOS 100  BILITOT 0.6  PROT 7.9  ALBUMIN 4.0   Recent Labs  Lab 10/30/20 1304  LIPASE 35   No results for input(s): AMMONIA in the last 168 hours. Coagulation Profile: No results for input(s): INR, PROTIME in the last 168 hours. Cardiac Enzymes: No results for input(s): CKTOTAL, CKMB, CKMBINDEX, TROPONINI in the last 168 hours. BNP (last 3 results) No results for input(s): PROBNP in the last 8760 hours. HbA1C: No results for input(s): HGBA1C in the last 72 hours. CBG: No results for input(s): GLUCAP in the last 168 hours. Lipid Profile: No results for input(s): CHOL, HDL, LDLCALC, TRIG, CHOLHDL, LDLDIRECT in the last 72 hours. Thyroid Function Tests: No results for input(s): TSH, T4TOTAL, FREET4, T3FREE, THYROIDAB in the last 72 hours. Anemia Panel: No results for input(s): VITAMINB12, FOLATE, FERRITIN, TIBC, IRON, RETICCTPCT in the last 72 hours. Urine analysis:    Component Value Date/Time   COLORURINE YELLOW 07/17/2019 1107   APPEARANCEUR HAZY (A) 07/17/2019 1107   LABSPEC 1.027 07/17/2019 1107   PHURINE 5.0 07/17/2019 1107   GLUCOSEU NEGATIVE 07/17/2019 1107   HGBUR NEGATIVE 07/17/2019 1107   BILIRUBINUR NEGATIVE 07/17/2019 1107   KETONESUR 5 (A) 07/17/2019 1107   PROTEINUR NEGATIVE 07/17/2019 1107   UROBILINOGEN 0.2 12/01/2014 2012   NITRITE NEGATIVE 07/17/2019 1107   LEUKOCYTESUR TRACE  (A) 07/17/2019 1107   Sepsis Labs: @LABRCNTIP (procalcitonin:4,lacticidven:4) ) Recent Results (from the past 240 hour(s))  Resp Panel by RT-PCR (Flu A&B, Covid) Nasopharyngeal Swab     Status: Abnormal   Collection Time: 10/30/20  1:20 PM   Specimen: Nasopharyngeal Swab; Nasopharyngeal(NP) swabs in vial transport medium  Result Value Ref Range Status   SARS Coronavirus 2 by RT PCR POSITIVE (A) NEGATIVE Final  Comment: RESULT CALLED TO, READ BACK BY AND VERIFIED WITH: B. OAKLEY 5/20 @ 1406 BY S BEARD (NOTE) SARS-CoV-2 target nucleic acids are DETECTED.  The SARS-CoV-2 RNA is generally detectable in upper respiratory specimens during the acute phase of infection. Positive results are indicative of the presence of the identified virus, but do not rule out bacterial infection or co-infection with other pathogens not detected by the test. Clinical correlation with patient history and other diagnostic information is necessary to determine patient infection status. The expected result is Negative.  Fact Sheet for Patients: EntrepreneurPulse.com.au  Fact Sheet for Healthcare Providers: IncredibleEmployment.be  This test is not yet approved or cleared by the [place] FDA and  has been authorized for detection and/or diagnosis of SARS-CoV-2 by FDA under an Emergency Use Authorization (EUA).  This EUA will remain in effect (meaning this test can be  used) for the duration of  the COVID-19 declaration under Section 564(b)(1) of the Act, 21 U.S.C. section 360bbb-3(b)(1), unless the authorization is terminated or revoked sooner.     Influenza A by PCR NEGATIVE NEGATIVE Final   Influenza B by PCR NEGATIVE NEGATIVE Final    Comment: (NOTE) The Xpert Xpress SARS-CoV-2/FLU/RSV plus assay is intended as an aid in the diagnosis of influenza from Nasopharyngeal swab specimens and should not be used as a sole basis for treatment. Nasal washings  and aspirates are unacceptable for Xpert Xpress SARS-CoV-2/FLU/RSV testing.  Fact Sheet for Patients: EntrepreneurPulse.com.au  Fact Sheet for Healthcare Providers: IncredibleEmployment.be  This test is not yet approved or cleared by the [place] FDA and has been authorized for detection and/or diagnosis of SARS-CoV-2 by FDA under an Emergency Use Authorization (EUA). This EUA will remain in effect (meaning this test can be used) for the duration of the COVID-19 declaration under Section 564(b)(1) of the Act, 21 U.S.C. section 360bbb-3(b)(1), unless the authorization is terminated or revoked.  Performed at [facility], [address], [place], [place] [address]      Radiological Exams on Admission: CT ABDOMEN PELVIS WO CONTRAST  Result Date: 10/30/2020 CLINICAL DATA:  Acute non localized abdominal pain. EXAM: CT ABDOMEN AND PELVIS WITHOUT CONTRAST TECHNIQUE: Multidetector CT imaging of the abdomen and pelvis was performed following the standard protocol without IV contrast. COMPARISON:  CT abdomen and pelvis-01/24/2011 FINDINGS: Lower chest: Limited visualization of the lower thorax demonstrates subsegmental atelectasis involving the right middle and lower lobes. No discrete focal airspace opacities. No pleural effusion. Normal heart size. Coronary artery calcifications. Extensive calcifications of the mitral valve annulus. No pericardial effusion. Hepatobiliary: Normal hepatic contour. Punctate hypoattenuating hepatic lesions (image 18 and 29, series 2), are too small to accurately characterize though morphologically similar to remote examination performed in 2012 and thus favored to represent hepatic cysts. Note is made of a punctate granuloma within the subcapsular aspect of the right lobe of the liver (image 22, series 2). Post cholecystectomy. No ascites. Pancreas: Normal noncontrast appearance of the pancreas. Spleen: Normal noncontrast  appearance of the spleen. Adrenals/Urinary Tract: Note is made of an approximately 4.6 cm hypoattenuating lesion arising from the superior pole the right kidney which is incompletely characterized on the present examination though favored to represent a renal cyst. There is an additional punctate (approximately 0.8 cm) partially exophytic lesion arising from the superior pole the right kidney which is too small to adequately characterize though also favored to represent a renal cyst. No definite left-sided renal lesions on this noncontrast examination. No renal stones. No renal stones are  seen along the expected location of either ureter or the urinary bladder. Normal noncontrast appearance of the urinary bladder given underdistention. There is a minimal amount of grossly symmetric bilateral likely age and body habitus related perinephric stranding. No urinary obstruction. Normal noncontrast appearance of the bilateral adrenal glands. Stomach/Bowel: Scattered colonic diverticulosis without evidence of superimposed acute diverticulitis. Potential mild circumferential wall thickening involving loop of distal jejunum within the right mid and lower abdomen (axial images 52 and 58, series 6; coronal image 51, series 5), not resulting in enteric obstruction. Normal noncontrast appearance of the terminal ileum and the retrocecal appendix. No pneumoperitoneum, pneumatosis or portal venous gas. Vascular/Lymphatic: Atherosclerotic plaque within a tortuous but normal caliber abdominal aorta. No bulky retroperitoneal, mesenteric, pelvic or inguinal lymphadenopathy on this noncontrast examination. Reproductive: Post hysterectomy.  No discrete adnexal lesions. Other: Post bilateral breast augmentation. Note is made of a wide neck mesenteric fat containing periumbilical hernia measuring 7.4 x 2.9 x 3.0 cm (sagittal image 74, series 6; axial image 55, series 2). Musculoskeletal: No acute or aggressive osseous abnormalities. Post  bilateral total hip replacements without evidence of hardware failure or loosening however the distal ends of the femoral stem components was not imaged. Post fixation of the L3-L4 and L4-L5 spinous processes. Grade 1 anterolisthesis of L4 upon L5 without associated pars defects. Mild to moderate multilevel lumbar spine DDD, worse at L3-L4 with disc space height loss, endplate irregularity and small posteriorly directed disc osteophyte complex at this location. IMPRESSION: 1. Potential mild circumferential wall thickening involving short segment loop of the distal jejunum, not resulting in enteric obstruction and while potentially artifactual due to peristalsis conceivably a localized enteritis could have a similar appearance. Clinical correlation is advised. 2. Otherwise no explanation for patient's acute non localized abdominal pain. Specifically, no evidence of enteric or urinary obstruction. 3. Colonic diverticulosis without evidence of superimposed acute diverticulitis on this noncontrast examination. 4. Coronary calcifications.  Aortic Atherosclerosis (ICD10-I70.0). Electronically Signed   By: Sandi Mariscal M.D.   On: 10/30/2020 15:39   DG Chest Port 1 View  Result Date: 10/30/2020 CLINICAL DATA:  Shortness of breath.  Recent COVID-19 positive EXAM: PORTABLE CHEST 1 VIEW COMPARISON:  March 01, 2016 FINDINGS: There is elevation of the right hemidiaphragm. There is mild bibasilar atelectasis. There is no edema or airspace opacity. Heart size and pulmonary vascularity are normal. No adenopathy. Aorta is tortuous with aortic atherosclerosis. There are surgical clips in the left axilla. There is degenerative change in each shoulder. IMPRESSION: Mild bibasilar atelectasis. Elevation right hemidiaphragm. No edema or airspace opacity. Heart size normal. Aortic Atherosclerosis (ICD10-I70.0). Electronically Signed   By: Lowella Grip III M.D.   On: 10/30/2020 15:11    EKG: Independently reviewed.  Sinus  rhythm.  No acute ST changes.  Assessment/Plan: Principal Problem:   Acute respiratory failure with hypoxia (HCC) Active Problems:   GERD (gastroesophageal reflux disease)   Pneumonia due to COVID-19 virus   HTN (hypertension)   Hypothyroidism    This patient was discussed with the ED physician, including pertinent vitals, physical exam findings, labs, and imaging.  We also discussed care given by the ED provider.  1. Acute respiratory failure with hypoxia secondary to COVID-pneumonia Daily labs: CBC, CMP, CRP, D-dimer, ferritin, magnesium, phosphorus Discussed use of steroids: Solu-Medrol, remdesivir Proning Supplemental oxygen Zinc and vitamin C combivent HFA as needed Antitussives 2. Hypertension a. Continue home regimen 3. Hypothyroidism a. On synthroid 4. GERD  DVT prophylaxis: Lovenox Consultants: none Code Status: full  Family Communication: none  Disposition Plan: Patient should be able to return home   Truett Mainland, DO

## 2020-10-30 NOTE — ED Provider Notes (Signed)
[facility] EMERGENCY DEPARTMENT Provider Note   CSN: [id number] Arrival date & time: 10/30/20  1239     History Chief Complaint  Patient presents with  . Abdominal Pain  . Shortness of Breath    Karen [name] is a 75 y.o. female.  HPI Patient is a 75 year old female with past medical history significant for hypertension, hypothyroidism, pneumonia, sleep apnea, breast cancer in full remission  Patient is presented today with 3 days of some shortness of breath, congestion, sore throat, fatigue.  She states that she has no chest pain lightheadedness or dizziness.  She states she feels overall quite weak however. She states that she feels more short of breath with exertion.  She states that she is normally able to get around quite well do her activities of daily living however she has felt less capable of doing so over the past couple days.  She denies any leg swelling.  She states she is not coughing up any blood or purulence.  She states that she has some sore throat which is achy constant but mild.  She denies any difficulty swallowing.  She states that she was treated for strep 1 month ago.  She also states that she had a sinus infection 1 month ago.  She states that she has received 2 COVID vaccines and 2 boosters.  Has not been diagnosed with COVID during this pandemic.  No recent surgeries, has not been hospitalized in the past 2 months, long travel, hemoptysis, estrogen containing OCP, no active cancer history.  No unilateral leg swelling.  No history of PE or VTE.  She states that she has also had some diarrhea for the past 2 days.  She states that it is loose but not watery.     Past Medical History:  Diagnosis Date  . Arthritis    right hip  . Breast cancer [facility]) 2004   bilateral mastectomy-all left lymph nodes removed; chemo. Patient states she had "spot" on the right breast as well.  . Diverticulitis   . GERD (gastroesophageal reflux disease)    since gallbladder  removed, no more GERD  . Goiter   . HTN (hypertension)   . Hypothyroidism   . Mental disorder    Major depressive disorder; generalized anxiety disorder; mood disorder  . Pneumonia   . Sleep apnea    has never been tested.  was dx with cancer first and had this problem on the back burner  . Tubular adenoma     Patient Active Problem List   Diagnosis Date Noted  . Acute respiratory failure with hypoxia ([place]) 10/30/2020  . Pneumonia due to COVID-19 virus 10/30/2020  . HTN (hypertension)   . Hypothyroidism   . Primary osteoarthritis of left hip 07/25/2019  . Osteoarthritis of left hip 07/25/2019  . History of total right hip arthroplasty 03/01/2016  . Hypoxemia 03/01/2016  . Symptomatic anemia 03/01/2016  . Hypokalemia 03/01/2016  . Primary osteoarthritis of right hip 02/29/2016  . Wound infection 12/17/2014  . Wound infection after surgery 11/07/2014  . Spinal stenosis 09/25/2014  . Hematochezia 03/19/2013  . LLQ pain 01/24/2011  . Umbilical hernia 64/33/2951  . Rectal bleed 01/24/2011  . GERD (gastroesophageal reflux disease) 01/24/2011  . Dysphagia 01/24/2011  . DIVERTICULITIS-COLON 08/30/2010    Past Surgical History:  Procedure Laterality Date  . ABDOMINAL HYSTERECTOMY    . bilateral mastectomy w/reconstruction  2004  . BREAST SURGERY    . CHOLECYSTECTOMY    . COLONOSCOPY  2008  colon with few diverticula in sigmoid colon  . COLONOSCOPY  02/16/2011   Dr. Jena Gaussourk- colon and rectal polyps= tubular adenoma, L sided diverticulosis.  . DEBRIDEMENT AND CLOSURE WOUND N/A 12/17/2014   Procedure: CLOSURE WOUND LUMBAR SPINE;  Surgeon: Venita Lickahari Brooks, MD;  Location: [facility] OR;  Service: Orthopedics;  Laterality: N/A;  . DILATION AND CURETTAGE OF UTERUS    . ESOPHAGOGASTRODUODENOSCOPY  2008   lipoma distal esophagus, gastritis   . ESOPHAGOGASTRODUODENOSCOPY  02/16/2011   Dr. Jena Gaussourk- normal esophagus, small hiatal hernia o/w normal stomach. normal first and second portion of the duodenum   . HERNIA REPAIR     umbilical hernia was repaired  . INJECTION KNEE Right 11/07/2014   Procedure: KNEE INJECTION;  Surgeon: Venita Lickahari Brooks, MD;  Location: [facility] OR;  Service: Orthopedics;  Laterality: Right;  . LUMBAR LAMINECTOMY/DECOMPRESSION MICRODISCECTOMY N/A 09/25/2014   Procedure: DECOMPRESSION L3-L5 WITH INSERTION OF COFLEX   ( 2 LEVELS);  Surgeon: Venita Lickahari Brooks, MD;  [facility];  Service: Orthopedics;  Laterality: N/A;  . LUMBAR LAMINECTOMY/DECOMPRESSION MICRODISCECTOMY N/A 11/07/2014   Procedure: IRRIGATION AND DEBRIDEMENT BACK WOUND, PLACEMENT OF ANTIBIOTIC BEADS;  Surgeon: Venita Lickahari Brooks, MD;  Location: [facility] OR;  Service: Orthopedics;  Laterality: N/A;  . LUMBAR WOUND DEBRIDEMENT N/A 12/01/2014   Procedure: IRRIGATION AND DEBRIDEMENT OF LUMBAR WOUND WITH VAC PLACEMENT;  Surgeon: Venita Lickahari Brooks, MD;  Location: [facility] OR;  Service: Orthopedics;  Laterality: N/A;  . LUMBAR WOUND DEBRIDEMENT N/A 12/03/2014   Procedure: WOUND VAC DRESSING CHANGE ;  Surgeon: Venita Lickahari Brooks, MD;  Location: [facility] OR;  Service: Orthopedics;  Laterality: N/A;  Elease Hashimoto. MALONEY DILATION  02/16/2011   Procedure: Elease HashimotoMALONEY DILATION;  Surgeon: Corbin Adeobert M Rourk, MD;  Location: [facility] ENDO SUITE;  Service: Endoscopy;  Laterality: N/A;  . MASTECTOMY     bilateral 2004 left side had 17 nodes removed  . PARTIAL HYSTERECTOMY    . SAVORY DILATION  02/16/2011   Procedure: SAVORY DILATION;  Surgeon: Corbin Adeobert M Rourk, MD;  Location: [facility] SUITE;  Service: Endoscopy;  Laterality: N/A;  . TOTAL HIP ARTHROPLASTY Right 02/29/2016   Procedure: TOTAL HIP ARTHROPLASTY ANTERIOR APPROACH;  Surgeon: Samson FredericBrian Swinteck, MD;  Location: [facility] OR;  Service: Orthopedics;  Laterality: Right;  . TOTAL HIP ARTHROPLASTY Left 07/25/2019   Procedure: TOTAL HIP ARTHROPLASTY ANTERIOR APPROACH;  Surgeon: Samson FredericSwinteck, Brian, MD;  Location: [facility] ORS;  Service: Orthopedics;  Laterality: Left;     OB History    Gravida  3   Para  3   Term      Preterm      AB      Living  3      SAB      IAB      Ectopic      Multiple      Live Births              Family History  Problem Relation Age of Onset  . Pancreatic cancer Mother        deceased  . Aneurysm Father        deceased, aortic  . Breast cancer Sister        deceased 8 years after initial diagnosis  . Breast cancer Sister        survivor  . Diverticulitis Brother        s/p partial colon resection  . Suicidality Brother   . Cancer Son        lung  . Colon cancer Neg Hx  Social History   Tobacco Use  . Smoking status: Former Smoker    Packs/day: 1.00    Years: 20.00    Pack years: 20.00    Quit date: 09/24/1995    Years since quitting: 25.1  . Smokeless tobacco: Never Used  . Tobacco comment: Quit 1997  Vaping Use  . Vaping Use: Never used  Substance Use Topics  . Alcohol use: No  . Drug use: No    Home Medications Prior to Admission medications   Medication Sig Start Date End Date Taking? Authorizing Provider  ALPRAZolam Duanne Moron) 1 MG tablet Take 1 tablet (1 mg total) by mouth 3 (three) times daily as needed for anxiety. 12/10/14   Estill Dooms, MD  aspirin 81 MG chewable tablet Chew 1 tablet (81 mg total) by mouth 2 (two) times daily. 07/26/19   Swinteck, Aaron Edelman, MD  docusate sodium (COLACE) 100 MG capsule Take 1 capsule (100 mg total) by mouth 2 (two) times daily. 07/26/19   Swinteck, Aaron Edelman, MD  HYDROcodone-acetaminophen (NORCO/VICODIN) 5-325 MG tablet Take 1-2 tablets by mouth every 4 (four) hours as needed for moderate pain (pain score 4-6). 07/26/19   Swinteck, Aaron Edelman, MD  ibuprofen (ADVIL) 200 MG tablet Take 400 mg by mouth every 6 (six) hours as needed (hip pain.).    [provider]  levothyroxine (SYNTHROID, LEVOTHROID) 100 MCG tablet Take 100 mcg by mouth daily before breakfast.  08/29/14   [provider]  olmesartan-hydrochlorothiazide (BENICAR HCT) 40-25 MG tablet Take 1 tablet by mouth daily. 05/30/19   [provider]  ondansetron (ZOFRAN) 4  MG tablet Take 1 tablet (4 mg total) by mouth every 6 (six) hours as needed for nausea. 07/26/19   Swinteck, Aaron Edelman, MD    Allergies    Sulfonamide derivatives and Morphine and related  Review of Systems   Review of Systems  Constitutional: Positive for fatigue. Negative for chills and fever.  HENT: Positive for congestion, postnasal drip, rhinorrhea and sore throat.   Eyes: Negative for redness.  Respiratory: Positive for cough and shortness of breath.   Cardiovascular: Negative for chest pain and leg swelling.  Gastrointestinal: Positive for abdominal pain, diarrhea and nausea. Negative for vomiting.  Endocrine: Negative for polyphagia.  Genitourinary: Negative for dysuria.  Musculoskeletal: Positive for myalgias.  Skin: Negative for rash.  Neurological: Negative for syncope and headaches.  Psychiatric/Behavioral: Negative for confusion.    Physical Exam Updated Vital Signs BP 129/63   Pulse 74   Temp 99.2 F (37.3 C) (Oral)   Resp 20   SpO2 93%   Physical Exam Vitals and nursing note reviewed.  Constitutional:      General: She is not in acute distress.    Appearance: Normal appearance. She is not ill-appearing.     Comments: Pleasant well-appearing 75 year old.  In no acute distress.  Sitting comfortably in bed.  Able answer questions appropriately follow commands. No increased work of breathing. Speaking in full sentences.  HENT:     Head: Normocephalic and atraumatic.     Nose: Nose normal.     Mouth/Throat:     Mouth: Mucous membranes are moist.  Eyes:     General: No scleral icterus.       Right eye: No discharge.        Left eye: No discharge.     Conjunctiva/sclera: Conjunctivae normal.  Cardiovascular:     Rate and Rhythm: Normal rate and regular rhythm.     Pulses: Normal pulses.  Heart sounds: Normal heart sounds.  Pulmonary:     Effort: Pulmonary effort is normal. No respiratory distress.     Breath sounds: No stridor. Wheezing present.      Comments: Faint expiratory wheezing.  Good air movement.  Bronchovesicular breath sounds throughout. Abdominal:     Palpations: Abdomen is soft.     Tenderness: There is abdominal tenderness. There is no right CVA tenderness, left CVA tenderness, guarding or rebound.     Comments: Mild tenderness to palpation meningitis generalized with no focal tenderness and no guarding or rebound.  No CVA tenderness.  Abdomen is protuberant but not distended.  Musculoskeletal:     Cervical back: Normal range of motion.     Right lower leg: No edema.     Left lower leg: No edema.     Comments: No lower extremity edema no calf tenderness  Skin:    General: Skin is warm and dry.     Capillary Refill: Capillary refill takes less than 2 seconds.  Neurological:     Mental Status: She is alert and oriented to person, place, and time. Mental status is at baseline.  Psychiatric:        Mood and Affect: Mood normal.        Behavior: Behavior normal.     ED Results / Procedures / Treatments   Labs (all labs ordered are listed, but only abnormal results are displayed) Labs Reviewed  RESP PANEL BY RT-PCR (FLU A&B, COVID) ARPGX2 - Abnormal; Notable for the following components:      Result Value   SARS Coronavirus 2 by RT PCR POSITIVE (*)    All other components within normal limits  CBC WITH DIFFERENTIAL/PLATELET - Abnormal; Notable for the following components:   Monocytes Absolute 1.1 (*)    All other components within normal limits  COMPREHENSIVE METABOLIC PANEL - Abnormal; Notable for the following components:   Potassium 3.0 (*)    Glucose, Bld 128 (*)    Creatinine, Ser 1.05 (*)    GFR, Estimated 55 (*)    All other components within normal limits  URINE CULTURE  CULTURE, BLOOD (ROUTINE X 2)  CULTURE, BLOOD (ROUTINE X 2)  LIPASE, BLOOD  URINALYSIS, ROUTINE W REFLEX MICROSCOPIC  LACTIC ACID, PLASMA  LACTIC ACID, PLASMA  D-DIMER, QUANTITATIVE  PROCALCITONIN  LACTATE DEHYDROGENASE  FERRITIN   TRIGLYCERIDES  FIBRINOGEN  C-REACTIVE PROTEIN  MAGNESIUM    EKG None  Radiology CT ABDOMEN PELVIS WO CONTRAST  Result Date: 10/30/2020 CLINICAL DATA:  Acute non localized abdominal pain. EXAM: CT ABDOMEN AND PELVIS WITHOUT CONTRAST TECHNIQUE: Multidetector CT imaging of the abdomen and pelvis was performed following the standard protocol without IV contrast. COMPARISON:  CT abdomen and pelvis-01/24/2011 FINDINGS: Lower chest: Limited visualization of the lower thorax demonstrates subsegmental atelectasis involving the right middle and lower lobes. No discrete focal airspace opacities. No pleural effusion. Normal heart size. Coronary artery calcifications. Extensive calcifications of the mitral valve annulus. No pericardial effusion. Hepatobiliary: Normal hepatic contour. Punctate hypoattenuating hepatic lesions (image 18 and 29, series 2), are too small to accurately characterize though morphologically similar to remote examination performed in 2012 and thus favored to represent hepatic cysts. Note is made of a punctate granuloma within the subcapsular aspect of the right lobe of the liver (image 22, series 2). Post cholecystectomy. No ascites. Pancreas: Normal noncontrast appearance of the pancreas. Spleen: Normal noncontrast appearance of the spleen. Adrenals/Urinary Tract: Note is made of an approximately 4.6 cm hypoattenuating lesion arising  from the superior pole the right kidney which is incompletely characterized on the present examination though favored to represent a renal cyst. There is an additional punctate (approximately 0.8 cm) partially exophytic lesion arising from the superior pole the right kidney which is too small to adequately characterize though also favored to represent a renal cyst. No definite left-sided renal lesions on this noncontrast examination. No renal stones. No renal stones are seen along the expected location of either ureter or the urinary bladder. Normal noncontrast  appearance of the urinary bladder given underdistention. There is a minimal amount of grossly symmetric bilateral likely age and body habitus related perinephric stranding. No urinary obstruction. Normal noncontrast appearance of the bilateral adrenal glands. Stomach/Bowel: Scattered colonic diverticulosis without evidence of superimposed acute diverticulitis. Potential mild circumferential wall thickening involving loop of distal jejunum within the right mid and lower abdomen (axial images 52 and 58, series 6; coronal image 51, series 5), not resulting in enteric obstruction. Normal noncontrast appearance of the terminal ileum and the retrocecal appendix. No pneumoperitoneum, pneumatosis or portal venous gas. Vascular/Lymphatic: Atherosclerotic plaque within a tortuous but normal caliber abdominal aorta. No bulky retroperitoneal, mesenteric, pelvic or inguinal lymphadenopathy on this noncontrast examination. Reproductive: Post hysterectomy.  No discrete adnexal lesions. Other: Post bilateral breast augmentation. Note is made of a wide neck mesenteric fat containing periumbilical hernia measuring 7.4 x 2.9 x 3.0 cm (sagittal image 74, series 6; axial image 55, series 2). Musculoskeletal: No acute or aggressive osseous abnormalities. Post bilateral total hip replacements without evidence of hardware failure or loosening however the distal ends of the femoral stem components was not imaged. Post fixation of the L3-L4 and L4-L5 spinous processes. Grade 1 anterolisthesis of L4 upon L5 without associated pars defects. Mild to moderate multilevel lumbar spine DDD, worse at L3-L4 with disc space height loss, endplate irregularity and small posteriorly directed disc osteophyte complex at this location. IMPRESSION: 1. Potential mild circumferential wall thickening involving short segment loop of the distal jejunum, not resulting in enteric obstruction and while potentially artifactual due to peristalsis conceivably a  localized enteritis could have a similar appearance. Clinical correlation is advised. 2. Otherwise no explanation for patient's acute non localized abdominal pain. Specifically, no evidence of enteric or urinary obstruction. 3. Colonic diverticulosis without evidence of superimposed acute diverticulitis on this noncontrast examination. 4. Coronary calcifications.  Aortic Atherosclerosis (ICD10-I70.0). Electronically Signed   By: Sandi Mariscal M.D.   On: 10/30/2020 15:39   DG Chest Port 1 View  Result Date: 10/30/2020 CLINICAL DATA:  Shortness of breath.  Recent COVID-19 positive EXAM: PORTABLE CHEST 1 VIEW COMPARISON:  March 01, 2016 FINDINGS: There is elevation of the right hemidiaphragm. There is mild bibasilar atelectasis. There is no edema or airspace opacity. Heart size and pulmonary vascularity are normal. No adenopathy. Aorta is tortuous with aortic atherosclerosis. There are surgical clips in the left axilla. There is degenerative change in each shoulder. IMPRESSION: Mild bibasilar atelectasis. Elevation right hemidiaphragm. No edema or airspace opacity. Heart size normal. Aortic Atherosclerosis (ICD10-I70.0). Electronically Signed   By: Lowella Grip III M.D.   On: 10/30/2020 15:11    Procedures .Critical Care Performed by: Tedd Sias, PA Authorized by: Tedd Sias, PA   Critical care provider statement:    Critical care time (minutes):  35   Critical care time was exclusive of:  Separately billable procedures and treating other patients and teaching time   Critical care was necessary to treat or prevent imminent or life-threatening  deterioration of the following conditions:  Respiratory failure   Critical care was time spent personally by me on the following activities:  Discussions with consultants, evaluation of patient's response to treatment, examination of patient, review of old charts, re-evaluation of patient's condition, pulse oximetry, ordering and review of  radiographic studies, ordering and review of laboratory studies and ordering and performing treatments and interventions   I assumed direction of critical care for this patient from another provider in my specialty: no       Medications Ordered in ED Medications  ipratropium (ATROVENT) nebulizer solution 0.5 mg (0.5 mg Nebulization Not Given 10/30/20 1500)  remdesivir 100 mg in sodium chloride 0.9 % 100 mL IVPB (100 mg Intravenous New Bag/Given 10/30/20 1605)    Followed by  remdesivir 100 mg in sodium chloride 0.9 % 100 mL IVPB (has no administration in time range)  albuterol (VENTOLIN HFA) 108 (90 Base) MCG/ACT inhaler 4 puff (4 puffs Inhalation Given 10/30/20 1500)  potassium chloride SA (KLOR-CON) CR tablet 40 mEq (40 mEq Oral Given 10/30/20 1605)  acetaminophen (TYLENOL) tablet 650 mg (650 mg Oral Given 10/30/20 1606)  dexamethasone (DECADRON) injection 6 mg (6 mg Intravenous Given 10/30/20 1606)    ED Course  I have reviewed the triage vital signs and the nursing notes.  Pertinent labs & imaging results that were available during my care of the patient were reviewed by me and considered in my medical decision making (see chart for details).  Clinical Course as of 10/30/20 1614  Fri Oct 30, 2020  1545 Discussed with hospitalist who will admit. [WF]    Clinical Course User Index [WF] Tedd Sias, [place]   MDM Rules/Calculators/A&P                          Patient is a 75 year old female with symptoms concerning for COVID including fatigue, hypoxia, cough, congestion, sore throat.  She is double vaccinated and double boosted.  Physical exam is notable for some faint expiratory wheezing.  This improved with albuterol.  Patient otherwise well-appearing and is on 3 L nasal cannula and satting greater than 95% at this time.  Turned off supplemental oxygen and patient desatted to 90 without exertion.  Patient was noted to be 86% on room air initially was placed on oxygen and  improved.  COVID test is positive I suspect the patient hypoxia is related to her COVID-positive condition.  CBC without leukocytosis or anemia.  CMP unremarkable apart from mild hypokalemia which was repleted orally.  This may be part of her fatigue and may be related to her diarrhea.    Lipase was normal limits doubt pancreatitis.  COVID labs obtained and patient provided with Decadron, remdesivir orders placed with dosing per pharmacy.  EKG with normal sinus rhythm no ST-T wave abnormalities of note.  No S1Q3T3.  No significant notable hypertrophy.  Patient is agreeable to admission.  Attempted to contact daughter but was unable to get a hold of daughter.  Karen [name] was evaluated in Emergency Department on 10/30/2020 for the symptoms described in the history of present illness. She was evaluated in the context of the global COVID-19 pandemic, which necessitated consideration that the patient might be at risk for infection with the SARS-CoV-2 virus that causes COVID-19. Institutional protocols and algorithms that pertain to the evaluation of patients at risk for COVID-19 are in a state of rapid change based on information released by regulatory bodies including the CDC  and federal and state organizations. These policies and algorithms were followed during the patient's care in the ED.   Final Clinical Impression(s) / ED Diagnoses Final diagnoses:  Acute hypoxemic respiratory failure due to COVID-19 [facility])  Generalized abdominal pain  Shortness of breath  Hypokalemia    Rx / DC Orders ED Discharge Orders    None       Tedd Sias, [place] 10/30/20 1617    Milton Ferguson, MD 10/31/20 2318

## 2020-10-30 NOTE — Progress Notes (Signed)
Remdesivir - Pharmacy Brief Note     A/P:  Remdesivir 200 mg IVPB once followed by 100 mg IVPB daily x 4 days.   Hart Robinsons, PharmD 10/30/2020 3:51 PM

## 2020-10-31 DIAGNOSIS — J9601 Acute respiratory failure with hypoxia: Secondary | ICD-10-CM

## 2020-10-31 LAB — C-REACTIVE PROTEIN: CRP: 1.1 mg/dL — ABNORMAL HIGH (ref <1.0)

## 2020-10-31 LAB — CBC WITH DIFFERENTIAL/PLATELET
Abs Immature Granulocytes: 0.03 10*3/uL (ref 0.00–0.07)
Basophils Absolute: 0 10*3/uL (ref 0.0–0.1)
Basophils Relative: 0 %
Eosinophils Absolute: 0 10*3/uL (ref 0.0–0.5)
Eosinophils Relative: 0 %
HCT: 42.4 % (ref 36.0–46.0)
Hemoglobin: 13.6 g/dL (ref 12.0–15.0)
Immature Granulocytes: 1 %
Lymphocytes Relative: 19 %
Lymphs Abs: 0.9 10*3/uL (ref 0.7–4.0)
MCH: 31.9 pg (ref 26.0–34.0)
MCHC: 32.1 g/dL (ref 30.0–36.0)
MCV: 99.5 fL (ref 80.0–100.0)
Monocytes Absolute: 0.6 10*3/uL (ref 0.1–1.0)
Monocytes Relative: 12 %
Neutro Abs: 3.3 10*3/uL (ref 1.7–7.7)
Neutrophils Relative %: 68 %
Platelets: 216 10*3/uL (ref 150–400)
RBC: 4.26 MIL/uL (ref 3.87–5.11)
RDW: 14.6 % (ref 11.5–15.5)
WBC: 4.8 10*3/uL (ref 4.0–10.5)
nRBC: 0 % (ref 0.0–0.2)

## 2020-10-31 LAB — FERRITIN: Ferritin: 118 ng/mL (ref 11–307)

## 2020-10-31 LAB — COMPREHENSIVE METABOLIC PANEL
ALT: 28 U/L (ref 0–44)
AST: 33 U/L (ref 15–41)
Albumin: 3.6 g/dL (ref 3.5–5.0)
Alkaline Phosphatase: 93 U/L (ref 38–126)
Anion gap: 7 (ref 5–15)
BUN: 21 mg/dL (ref 8–23)
CO2: 33 mmol/L — ABNORMAL HIGH (ref 22–32)
Calcium: 8.9 mg/dL (ref 8.9–10.3)
Chloride: 98 mmol/L (ref 98–111)
Creatinine, Ser: 0.85 mg/dL (ref 0.44–1.00)
GFR, Estimated: >60 mL/min (ref >60)
Glucose, Bld: 108 mg/dL — ABNORMAL HIGH (ref 70–99)
Potassium: 4.2 mmol/L (ref 3.5–5.1)
Sodium: 138 mmol/L (ref 135–145)
Total Bilirubin: 0.3 mg/dL (ref 0.3–1.2)
Total Protein: 7.9 g/dL (ref 6.5–8.1)

## 2020-10-31 LAB — MAGNESIUM: Magnesium: 1.9 mg/dL (ref 1.7–2.4)

## 2020-10-31 LAB — D-DIMER, QUANTITATIVE: D-Dimer, Quant: 0.94 ug/mL-FEU — ABNORMAL HIGH (ref 0.00–0.50)

## 2020-10-31 MED ORDER — PANTOPRAZOLE SODIUM 40 MG PO TBEC
40.0000 mg | DELAYED_RELEASE_TABLET | Freq: Every day | ORAL | Status: DC
  Administered 2020-10-31 – 2020-11-01 (×2): 40 mg via ORAL
  Filled 2020-10-31 (×2): qty 1

## 2020-10-31 MED ORDER — MENTHOL 3 MG MT LOZG
1.0000 | LOZENGE | OROMUCOSAL | Status: DC | PRN
  Administered 2020-10-31: 3 mg via ORAL
  Filled 2020-10-31: qty 9

## 2020-10-31 MED ORDER — IPRATROPIUM-ALBUTEROL 20-100 MCG/ACT IN AERS
1.0000 | INHALATION_SPRAY | Freq: Three times a day (TID) | RESPIRATORY_TRACT | Status: DC
  Administered 2020-11-01: 1 via RESPIRATORY_TRACT

## 2020-10-31 NOTE — Progress Notes (Signed)
PROGRESS NOTE    Karen [name]  [id number] DOB: [birth year] DOA: 10/30/2020 PCP: Celene Squibb, MD   Brief Narrative:   Karen [name] is a 75 y.o. female with a history of hypothyroidism, hypertension, history of breast cancer status post bilateral mastectomy, GERD.  Patient seen for 2 days of worsening shortness of breath with an onset of fever yesterday and diarrhea on the morning of admission.  She was noted to be COVID-positive and has been admitted for acute hypoxemic respiratory failure in the setting of COVID-pneumonia.  Assessment & Plan:   Principal Problem:   Acute respiratory failure with hypoxia (HCC) Active Problems:   GERD (gastroesophageal reflux disease)   Pneumonia due to COVID-19 virus   HTN (hypertension)   Hypothyroidism   Acute hypoxemic respiratory failure secondary to COVID-pneumonia -Monitor daily labs with inflammatory markers down trending -Continue IV Decadron as well as remdesivir as ordered -Supportive measures -Incentive spirometry wean oxygen as tolerated  Hypertension-controlled -Continue home medications  Hypothyroidism -Continue Synthroid  GERD -Starting Protonix for GI prophylaxis while on steroids  Obesity -Lifestyle changes outpatient   DVT prophylaxis: Lovenox Code Status: Full Family Communication: Patient will call Disposition Plan:  Status is: Inpatient  Remains inpatient appropriate because:IV treatments appropriate due to intensity of illness or inability to take PO and Inpatient level of care appropriate due to severity of illness   Dispo: The patient is from: Home              Anticipated d/c is to: Home              Patient currently is not medically stable to d/c.   Difficult to place patient No   Consultants:   None  Procedures:   See below  Antimicrobials:  Anti-infectives (From admission, onward)   Start     Dose/Rate Route Frequency Ordered Stop   10/31/20 1000  remdesivir 100 mg in sodium chloride  0.9 % 100 mL IVPB       "Followed by" Linked Group Details   100 mg 200 mL/hr over 30 Minutes Intravenous Daily 10/30/20 1550 11/04/20 0959   10/30/20 1600  remdesivir 100 mg in sodium chloride 0.9 % 100 mL IVPB       "Followed by" Linked Group Details   100 mg 200 mL/hr over 30 Minutes Intravenous Every 1 hr x 2 10/30/20 1550 10/30/20 1741       Subjective: Patient seen and evaluated today with no new acute complaints or concerns. No acute concerns or events noted overnight.  She states that she is starting to breathe better this morning and denies any diarrhea.  Objective: Vitals:   10/31/20 0043 10/31/20 0453 10/31/20 0816 10/31/20 0856  BP:  (!) 141/75  121/68  Pulse:  66  75  Resp:  (!) 21    Temp:  97.7 F (36.5 C)    TempSrc:  Oral    SpO2: 93% 98% 96% 94%  Weight:      Height:        Intake/Output Summary (Last 24 hours) at 10/31/2020 1234 Last data filed at 10/31/2020 0400 Gross per 24 hour  Intake 500 ml  Output --  Net 500 ml   Filed Weights   10/30/20 1831  Weight: 89.7 kg    Examination:  General exam: Appears calm and comfortable, obese Respiratory system: Clear to auscultation. Respiratory effort normal.  Currently on 2 L nasal cannula oxygen Cardiovascular system: S1 & S2 heard, RRR.  Gastrointestinal system:  Abdomen is soft Central nervous system: Alert and awake Extremities: No edema Skin: No significant lesions noted Psychiatry: Flat affect.    Data Reviewed: I have personally reviewed following labs and imaging studies  CBC: Recent Labs  Lab 10/30/20 1304 10/31/20 0601  WBC 8.4 4.8  NEUTROABS 6.2 3.3  HGB 14.0 13.6  HCT 42.8 42.4  MCV 98.6 99.5  PLT 199 025   Basic Metabolic Panel: Recent Labs  Lab 10/30/20 1304 10/30/20 1600 10/31/20 0729  NA 137  --  138  K 3.0*  --  4.2  CL 98  --  98  CO2 29  --  33*  GLUCOSE 128*  --  108*  BUN 18  --  21  CREATININE 1.05*  --  0.85  CALCIUM 8.9  --  8.9  MG  --  1.9 1.9    GFR: Estimated Creatinine Clearance: 59.5 mL/min (by C-G formula based on SCr of 0.85 mg/dL). Liver Function Tests: Recent Labs  Lab 10/30/20 1304 10/31/20 0729  AST 39 33  ALT 29 28  ALKPHOS 100 93  BILITOT 0.6 0.3  PROT 7.9 7.9  ALBUMIN 4.0 3.6   Recent Labs  Lab 10/30/20 1304  LIPASE 35   No results for input(s): AMMONIA in the last 168 hours. Coagulation Profile: No results for input(s): INR, PROTIME in the last 168 hours. Cardiac Enzymes: No results for input(s): CKTOTAL, CKMB, CKMBINDEX, TROPONINI in the last 168 hours. BNP (last 3 results) No results for input(s): PROBNP in the last 8760 hours. HbA1C: No results for input(s): HGBA1C in the last 72 hours. CBG: No results for input(s): GLUCAP in the last 168 hours. Lipid Profile: Recent Labs    10/30/20 1600  TRIG 105   Thyroid Function Tests: No results for input(s): TSH, T4TOTAL, FREET4, T3FREE, THYROIDAB in the last 72 hours. Anemia Panel: Recent Labs    10/30/20 1600 10/31/20 0729  FERRITIN 123 118   Sepsis Labs: Recent Labs  Lab 10/30/20 1600 10/30/20 1608 10/30/20 1804  PROCALCITON <0.10  --   --   LATICACIDVEN  --  1.4 1.1    Recent Results (from the past 240 hour(s))  Resp Panel by RT-PCR (Flu A&B, Covid) Nasopharyngeal Swab     Status: Abnormal   Collection Time: 10/30/20  1:20 PM   Specimen: Nasopharyngeal Swab; Nasopharyngeal(NP) swabs in vial transport medium  Result Value Ref Range Status   SARS Coronavirus 2 by RT PCR POSITIVE (A) NEGATIVE Final    Comment: RESULT CALLED TO, READ BACK BY AND VERIFIED WITH: B. OAKLEY 5/20 @ 1406 BY S BEARD (NOTE) SARS-CoV-2 target nucleic acids are DETECTED.  The SARS-CoV-2 RNA is generally detectable in upper respiratory specimens during the acute phase of infection. Positive results are indicative of the presence of the identified virus, but do not rule out bacterial infection or co-infection with other pathogens not detected by the test.  Clinical correlation with patient history and other diagnostic information is necessary to determine patient infection status. The expected result is Negative.  Fact Sheet for Patients: EntrepreneurPulse.com.au  Fact Sheet for Healthcare Providers: IncredibleEmployment.be  This test is not yet approved or cleared by the [place] FDA and  has been authorized for detection and/or diagnosis of SARS-CoV-2 by FDA under an Emergency Use Authorization (EUA).  This EUA will remain in effect (meaning this test can be  used) for the duration of  the COVID-19 declaration under Section 564(b)(1) of the Act, 21 U.S.C. section 360bbb-3(b)(1), unless the  authorization is terminated or revoked sooner.     Influenza A by PCR NEGATIVE NEGATIVE Final   Influenza B by PCR NEGATIVE NEGATIVE Final    Comment: (NOTE) The Xpert Xpress SARS-CoV-2/FLU/RSV plus assay is intended as an aid in the diagnosis of influenza from Nasopharyngeal swab specimens and should not be used as a sole basis for treatment. Nasal washings and aspirates are unacceptable for Xpert Xpress SARS-CoV-2/FLU/RSV testing.  Fact Sheet for Patients: EntrepreneurPulse.com.au  Fact Sheet for Healthcare Providers: IncredibleEmployment.be  This test is not yet approved or cleared by the [place] FDA and has been authorized for detection and/or diagnosis of SARS-CoV-2 by FDA under an Emergency Use Authorization (EUA). This EUA will remain in effect (meaning this test can be used) for the duration of the COVID-19 declaration under Section 564(b)(1) of the Act, 21 U.S.C. section 360bbb-3(b)(1), unless the authorization is terminated or revoked.  Performed at [facility], [address], [place], [place] [address]   Blood Culture (routine x 2)     Status: None (Preliminary result)   Collection Time: 10/30/20  4:08 PM   Specimen: BLOOD RIGHT HAND  Result  Value Ref Range Status   Specimen Description BLOOD RIGHT HAND  Final   Special Requests   Final    Blood Culture adequate volume BOTTLES DRAWN AEROBIC AND ANAEROBIC   Culture   Final    NO GROWTH < 24 HOURS Performed at [facility], [address], [place], [place] [address]    Report Status PENDING  Incomplete  Blood Culture (routine x 2)     Status: None (Preliminary result)   Collection Time: 10/30/20  4:19 PM   Specimen: BLOOD RIGHT HAND  Result Value Ref Range Status   Specimen Description BLOOD RIGHT HAND  Final   Special Requests   Final    Blood Culture adequate volume BOTTLES DRAWN AEROBIC AND ANAEROBIC   Culture   Final    NO GROWTH < 24 HOURS Performed at [facility], [address], [place], [place] [address]    Report Status PENDING  Incomplete         Radiology Studies: CT ABDOMEN PELVIS WO CONTRAST  Result Date: 10/30/2020 CLINICAL DATA:  Acute non localized abdominal pain. EXAM: CT ABDOMEN AND PELVIS WITHOUT CONTRAST TECHNIQUE: Multidetector CT imaging of the abdomen and pelvis was performed following the standard protocol without IV contrast. COMPARISON:  CT abdomen and pelvis-01/24/2011 FINDINGS: Lower chest: Limited visualization of the lower thorax demonstrates subsegmental atelectasis involving the right middle and lower lobes. No discrete focal airspace opacities. No pleural effusion. Normal heart size. Coronary artery calcifications. Extensive calcifications of the mitral valve annulus. No pericardial effusion. Hepatobiliary: Normal hepatic contour. Punctate hypoattenuating hepatic lesions (image 18 and 29, series 2), are too small to accurately characterize though morphologically similar to remote examination performed in 2012 and thus favored to represent hepatic cysts. Note is made of a punctate granuloma within the subcapsular aspect of the right lobe of the liver (image 22, series 2). Post cholecystectomy. No ascites. Pancreas: Normal noncontrast appearance  of the pancreas. Spleen: Normal noncontrast appearance of the spleen. Adrenals/Urinary Tract: Note is made of an approximately 4.6 cm hypoattenuating lesion arising from the superior pole the right kidney which is incompletely characterized on the present examination though favored to represent a renal cyst. There is an additional punctate (approximately 0.8 cm) partially exophytic lesion arising from the superior pole the right kidney which is too small to adequately characterize though also favored  to represent a renal cyst. No definite left-sided renal lesions on this noncontrast examination. No renal stones. No renal stones are seen along the expected location of either ureter or the urinary bladder. Normal noncontrast appearance of the urinary bladder given underdistention. There is a minimal amount of grossly symmetric bilateral likely age and body habitus related perinephric stranding. No urinary obstruction. Normal noncontrast appearance of the bilateral adrenal glands. Stomach/Bowel: Scattered colonic diverticulosis without evidence of superimposed acute diverticulitis. Potential mild circumferential wall thickening involving loop of distal jejunum within the right mid and lower abdomen (axial images 52 and 58, series 6; coronal image 51, series 5), not resulting in enteric obstruction. Normal noncontrast appearance of the terminal ileum and the retrocecal appendix. No pneumoperitoneum, pneumatosis or portal venous gas. Vascular/Lymphatic: Atherosclerotic plaque within a tortuous but normal caliber abdominal aorta. No bulky retroperitoneal, mesenteric, pelvic or inguinal lymphadenopathy on this noncontrast examination. Reproductive: Post hysterectomy.  No discrete adnexal lesions. Other: Post bilateral breast augmentation. Note is made of a wide neck mesenteric fat containing periumbilical hernia measuring 7.4 x 2.9 x 3.0 cm (sagittal image 74, series 6; axial image 55, series 2). Musculoskeletal: No acute  or aggressive osseous abnormalities. Post bilateral total hip replacements without evidence of hardware failure or loosening however the distal ends of the femoral stem components was not imaged. Post fixation of the L3-L4 and L4-L5 spinous processes. Grade 1 anterolisthesis of L4 upon L5 without associated pars defects. Mild to moderate multilevel lumbar spine DDD, worse at L3-L4 with disc space height loss, endplate irregularity and small posteriorly directed disc osteophyte complex at this location. IMPRESSION: 1. Potential mild circumferential wall thickening involving short segment loop of the distal jejunum, not resulting in enteric obstruction and while potentially artifactual due to peristalsis conceivably a localized enteritis could have a similar appearance. Clinical correlation is advised. 2. Otherwise no explanation for patient's acute non localized abdominal pain. Specifically, no evidence of enteric or urinary obstruction. 3. Colonic diverticulosis without evidence of superimposed acute diverticulitis on this noncontrast examination. 4. Coronary calcifications.  Aortic Atherosclerosis (ICD10-I70.0). Electronically Signed   By: Sandi Mariscal M.D.   On: 10/30/2020 15:39   DG Chest Port 1 View  Result Date: 10/30/2020 CLINICAL DATA:  Shortness of breath.  Recent COVID-19 positive EXAM: PORTABLE CHEST 1 VIEW COMPARISON:  March 01, 2016 FINDINGS: There is elevation of the right hemidiaphragm. There is mild bibasilar atelectasis. There is no edema or airspace opacity. Heart size and pulmonary vascularity are normal. No adenopathy. Aorta is tortuous with aortic atherosclerosis. There are surgical clips in the left axilla. There is degenerative change in each shoulder. IMPRESSION: Mild bibasilar atelectasis. Elevation right hemidiaphragm. No edema or airspace opacity. Heart size normal. Aortic Atherosclerosis (ICD10-I70.0). Electronically Signed   By: Lowella Grip III M.D.   On: 10/30/2020 15:11         Scheduled Meds: . vitamin C  500 mg Oral Daily  . aspirin  81 mg Oral BID  . dexamethasone  6 mg Oral Daily  . enoxaparin (LOVENOX) injection  40 mg Subcutaneous Q24H  . hydrochlorothiazide  25 mg Oral Daily  . ipratropium  0.5 mg Nebulization Once  . Ipratropium-Albuterol  1 puff Inhalation Q6H  . irbesartan  300 mg Oral Daily  . levothyroxine  100 mcg Oral QAC breakfast  . zinc sulfate  220 mg Oral Daily   Continuous Infusions: . remdesivir 100 mg in NS 100 mL 100 mg (10/31/20 1022)     LOS: 1 day  Time spent: 35 minutes    Raza Bayless Darleen Crocker, DO [facility]  If 7PM-7AM, please contact night-coverage [email or web address] 10/31/2020, 12:34 PM

## 2020-11-01 DIAGNOSIS — J9601 Acute respiratory failure with hypoxia: Secondary | ICD-10-CM | POA: Diagnosis not present

## 2020-11-01 LAB — CBC WITH DIFFERENTIAL/PLATELET
Abs Immature Granulocytes: 0.04 10*3/uL (ref 0.00–0.07)
Basophils Absolute: 0 10*3/uL (ref 0.0–0.1)
Basophils Relative: 0 %
Eosinophils Absolute: 0 10*3/uL (ref 0.0–0.5)
Eosinophils Relative: 0 %
HCT: 43.3 % (ref 36.0–46.0)
Hemoglobin: 13.6 g/dL (ref 12.0–15.0)
Immature Granulocytes: 1 %
Lymphocytes Relative: 29 %
Lymphs Abs: 1.8 10*3/uL (ref 0.7–4.0)
MCH: 31.1 pg (ref 26.0–34.0)
MCHC: 31.4 g/dL (ref 30.0–36.0)
MCV: 98.9 fL (ref 80.0–100.0)
Monocytes Absolute: 0.7 10*3/uL (ref 0.1–1.0)
Monocytes Relative: 12 %
Neutro Abs: 3.6 10*3/uL (ref 1.7–7.7)
Neutrophils Relative %: 58 %
Platelets: 210 10*3/uL (ref 150–400)
RBC: 4.38 MIL/uL (ref 3.87–5.11)
RDW: 14.3 % (ref 11.5–15.5)
WBC: 6.3 10*3/uL (ref 4.0–10.5)
nRBC: 0 % (ref 0.0–0.2)

## 2020-11-01 LAB — URINE CULTURE: Culture: NO GROWTH

## 2020-11-01 LAB — COMPREHENSIVE METABOLIC PANEL
ALT: 27 U/L (ref 0–44)
AST: 32 U/L (ref 15–41)
Albumin: 3.6 g/dL (ref 3.5–5.0)
Alkaline Phosphatase: 85 U/L (ref 38–126)
Anion gap: 6 (ref 5–15)
BUN: 27 mg/dL — ABNORMAL HIGH (ref 8–23)
CO2: 34 mmol/L — ABNORMAL HIGH (ref 22–32)
Calcium: 8.8 mg/dL — ABNORMAL LOW (ref 8.9–10.3)
Chloride: 96 mmol/L — ABNORMAL LOW (ref 98–111)
Creatinine, Ser: 0.72 mg/dL (ref 0.44–1.00)
GFR, Estimated: >60 mL/min (ref >60)
Glucose, Bld: 108 mg/dL — ABNORMAL HIGH (ref 70–99)
Potassium: 3.9 mmol/L (ref 3.5–5.1)
Sodium: 136 mmol/L (ref 135–145)
Total Bilirubin: 0.5 mg/dL (ref 0.3–1.2)
Total Protein: 7.4 g/dL (ref 6.5–8.1)

## 2020-11-01 LAB — C-REACTIVE PROTEIN: CRP: <0.5 mg/dL (ref <1.0)

## 2020-11-01 LAB — D-DIMER, QUANTITATIVE: D-Dimer, Quant: 0.82 ug/mL-FEU — ABNORMAL HIGH (ref 0.00–0.50)

## 2020-11-01 LAB — FERRITIN: Ferritin: 135 ng/mL (ref 11–307)

## 2020-11-01 MED ORDER — ZINC SULFATE 220 (50 ZN) MG PO CAPS: 220.0000 mg | ORAL_CAPSULE | Freq: Every day | ORAL | 0 refills | Status: DC

## 2020-11-01 MED ORDER — PANTOPRAZOLE SODIUM 40 MG PO TBEC: 40.0000 mg | DELAYED_RELEASE_TABLET | Freq: Every day | ORAL | 0 refills | Status: DC

## 2020-11-01 MED ORDER — ASCORBIC ACID 500 MG PO TABS
500.0000 mg | ORAL_TABLET | Freq: Every day | ORAL | 0 refills | Status: AC
Start: 2020-11-01 — End: ?

## 2020-11-01 MED ORDER — DEXAMETHASONE 6 MG PO TABS: 6.0000 mg | ORAL_TABLET | Freq: Every day | ORAL | 0 refills | Status: AC

## 2020-11-01 MED ORDER — GUAIFENESIN-DM 100-10 MG/5ML PO SYRP: 10.0000 mL | ORAL_SOLUTION | ORAL | 0 refills | Status: DC | PRN

## 2020-11-01 MED ORDER — IPRATROPIUM-ALBUTEROL 20-100 MCG/ACT IN AERS: 1.0000 | INHALATION_SPRAY | Freq: Four times a day (QID) | RESPIRATORY_TRACT | 1 refills | Status: DC | PRN

## 2020-11-01 NOTE — Discharge Summary (Signed)
Physician Discharge Summary  Karen [name] IOE:[id number] DOB: 02-24-1946 DOA: 10/30/2020  PCP: Celene Squibb, MD  Admit date: 10/30/2020  Discharge date: 11/01/2020  Admitted From:Home  Disposition:  Home  Recommendations for Outpatient Follow-up:  1. Follow up with PCP in 1-2 weeks 2. Continue on dexamethasone as prescribed for several more days 3. Continue on Combivent as needed for shortness of breath or wheezing 4. Continue with home medications as prior  Home Health: None  Equipment/Devices: None  Discharge Condition:Stable  CODE STATUS: Full  Diet recommendation: Heart Healthy  Brief/Interim Summary:  Karen Fetterly Terryis a 75 y.o.femalewith a history of hypothyroidism, hypertension, history of breast cancer status post bilateral mastectomy, GERD. Patient seen for 2 days of worsening shortness of breath with an onset of fever yesterday and diarrhea on the morning of admission.  She was noted to be COVID-positive and had been admitted for acute hypoxemic respiratory failure in the setting of COVID-pneumonia.  She has improved to baseline after treatment with IV steroids and IV remdesivir.  She is in stable condition for discharge today.  Discharge Diagnoses:  Principal Problem:   Acute respiratory failure with hypoxia (HCC) Active Problems:   GERD (gastroesophageal reflux disease)   Pneumonia due to COVID-19 virus   HTN (hypertension)   Hypothyroidism  Principal discharge diagnosis: Acute hypoxemic respiratory failure secondary to COVID-pneumonia.  Discharge Instructions  Discharge Instructions    Diet - low sodium heart healthy   Complete by: As directed    Increase activity slowly   Complete by: As directed      Allergies as of 11/01/2020      Reactions   Sulfonamide Derivatives Anaphylaxis   Morphine And Related Swelling   SWELLING REACTION UNSPECIFIED       Medication List    TAKE these medications   ALPRAZolam 1 MG tablet Commonly known as:  XANAX Take 1 tablet (1 mg total) by mouth 3 (three) times daily as needed for anxiety.   ascorbic acid 500 MG tablet Commonly known as: VITAMIN C Take 1 tablet (500 mg total) by mouth daily.   aspirin 81 MG chewable tablet Chew 1 tablet (81 mg total) by mouth 2 (two) times daily.   dexamethasone 6 MG tablet Commonly known as: DECADRON Take 1 tablet (6 mg total) by mouth daily for 5 days.   docusate sodium 100 MG capsule Commonly known as: COLACE Take 1 capsule (100 mg total) by mouth 2 (two) times daily.   guaiFENesin-dextromethorphan 100-10 MG/5ML syrup Commonly known as: ROBITUSSIN DM Take 10 mLs by mouth every 4 (four) hours as needed for cough.   HYDROcodone-acetaminophen 5-325 MG tablet Commonly known as: NORCO/VICODIN Take 1-2 tablets by mouth every 4 (four) hours as needed for moderate pain (pain score 4-6).   ibuprofen 200 MG tablet Commonly known as: ADVIL Take 400 mg by mouth every 6 (six) hours as needed (hip pain.).   Ipratropium-Albuterol 20-100 MCG/ACT Aers respimat Commonly known as: COMBIVENT Inhale 1 puff into the lungs every 6 (six) hours as needed for wheezing or shortness of breath.   levothyroxine 100 MCG tablet Commonly known as: SYNTHROID Take 100 mcg by mouth daily before breakfast.   olmesartan-hydrochlorothiazide 40-25 MG tablet Commonly known as: BENICAR HCT Take 1 tablet by mouth daily.   ondansetron 4 MG tablet Commonly known as: ZOFRAN Take 1 tablet (4 mg total) by mouth every 6 (six) hours as needed for nausea.   pantoprazole 40 MG tablet Commonly known as: PROTONIX Take 1 tablet (  40 mg total) by mouth daily for 7 days.   zinc sulfate 220 (50 Zn) MG capsule Take 1 capsule (220 mg total) by mouth daily.       Follow-up Information    Celene Squibb, MD. Schedule an appointment as soon as possible for a visit in 1 week(s).   Specialty: Internal Medicine Contact information: [place]  [phone]              Allergies  Allergen Reactions  . Sulfonamide Derivatives Anaphylaxis  . Morphine And Related Swelling    SWELLING REACTION UNSPECIFIED     Consultations:  None   Procedures/Studies: CT ABDOMEN PELVIS WO CONTRAST  Result Date: 10/30/2020 CLINICAL DATA:  Acute non localized abdominal pain. EXAM: CT ABDOMEN AND PELVIS WITHOUT CONTRAST TECHNIQUE: Multidetector CT imaging of the abdomen and pelvis was performed following the standard protocol without IV contrast. COMPARISON:  CT abdomen and pelvis-01/24/2011 FINDINGS: Lower chest: Limited visualization of the lower thorax demonstrates subsegmental atelectasis involving the right middle and lower lobes. No discrete focal airspace opacities. No pleural effusion. Normal heart size. Coronary artery calcifications. Extensive calcifications of the mitral valve annulus. No pericardial effusion. Hepatobiliary: Normal hepatic contour. Punctate hypoattenuating hepatic lesions (image 18 and 29, series 2), are too small to accurately characterize though morphologically similar to remote examination performed in 2012 and thus favored to represent hepatic cysts. Note is made of a punctate granuloma within the subcapsular aspect of the right lobe of the liver (image 22, series 2). Post cholecystectomy. No ascites. Pancreas: Normal noncontrast appearance of the pancreas. Spleen: Normal noncontrast appearance of the spleen. Adrenals/Urinary Tract: Note is made of an approximately 4.6 cm hypoattenuating lesion arising from the superior pole the right kidney which is incompletely characterized on the present examination though favored to represent a renal cyst. There is an additional punctate (approximately 0.8 cm) partially exophytic lesion arising from the superior pole the right kidney which is too small to adequately characterize though also favored to represent a renal cyst. No definite left-sided renal lesions on this  noncontrast examination. No renal stones. No renal stones are seen along the expected location of either ureter or the urinary bladder. Normal noncontrast appearance of the urinary bladder given underdistention. There is a minimal amount of grossly symmetric bilateral likely age and body habitus related perinephric stranding. No urinary obstruction. Normal noncontrast appearance of the bilateral adrenal glands. Stomach/Bowel: Scattered colonic diverticulosis without evidence of superimposed acute diverticulitis. Potential mild circumferential wall thickening involving loop of distal jejunum within the right mid and lower abdomen (axial images 52 and 58, series 6; coronal image 51, series 5), not resulting in enteric obstruction. Normal noncontrast appearance of the terminal ileum and the retrocecal appendix. No pneumoperitoneum, pneumatosis or portal venous gas. Vascular/Lymphatic: Atherosclerotic plaque within a tortuous but normal caliber abdominal aorta. No bulky retroperitoneal, mesenteric, pelvic or inguinal lymphadenopathy on this noncontrast examination. Reproductive: Post hysterectomy.  No discrete adnexal lesions. Other: Post bilateral breast augmentation. Note is made of a wide neck mesenteric fat containing periumbilical hernia measuring 7.4 x 2.9 x 3.0 cm (sagittal image 74, series 6; axial image 55, series 2). Musculoskeletal: No acute or aggressive osseous abnormalities. Post bilateral total hip replacements without evidence of hardware failure or loosening however the distal ends of the femoral stem components was not imaged. Post fixation of the L3-L4 and L4-L5 spinous processes. Grade 1 anterolisthesis of L4 upon L5 without associated pars defects. Mild to moderate multilevel  lumbar spine DDD, worse at L3-L4 with disc space height loss, endplate irregularity and small posteriorly directed disc osteophyte complex at this location. IMPRESSION: 1. Potential mild circumferential wall thickening  involving short segment loop of the distal jejunum, not resulting in enteric obstruction and while potentially artifactual due to peristalsis conceivably a localized enteritis could have a similar appearance. Clinical correlation is advised. 2. Otherwise no explanation for patient's acute non localized abdominal pain. Specifically, no evidence of enteric or urinary obstruction. 3. Colonic diverticulosis without evidence of superimposed acute diverticulitis on this noncontrast examination. 4. Coronary calcifications.  Aortic Atherosclerosis (ICD10-I70.0). Electronically Signed   By: Sandi Mariscal M.D.   On: 10/30/2020 15:39   DG Chest Port 1 View  Result Date: 10/30/2020 CLINICAL DATA:  Shortness of breath.  Recent COVID-19 positive EXAM: PORTABLE CHEST 1 VIEW COMPARISON:  March 01, 2016 FINDINGS: There is elevation of the right hemidiaphragm. There is mild bibasilar atelectasis. There is no edema or airspace opacity. Heart size and pulmonary vascularity are normal. No adenopathy. Aorta is tortuous with aortic atherosclerosis. There are surgical clips in the left axilla. There is degenerative change in each shoulder. IMPRESSION: Mild bibasilar atelectasis. Elevation right hemidiaphragm. No edema or airspace opacity. Heart size normal. Aortic Atherosclerosis (ICD10-I70.0). Electronically Signed   By: Lowella Grip III M.D.   On: 10/30/2020 15:11      Discharge Exam: Vitals:   11/01/20 0558 11/01/20 0849  BP: (!) 142/75   Pulse:    Resp:    Temp:    SpO2: 98% 97%   Vitals:   11/01/20 0346 11/01/20 0549 11/01/20 0558 11/01/20 0849  BP:  (!) 87/73 (!) 142/75   Pulse:  62    Resp:  19    Temp:  97.7 F (36.5 C)    TempSrc:  Oral    SpO2: 96% 92% 98% 97%  Weight:      Height:        General: Pt is alert, awake, not in acute distress Cardiovascular: RRR, S1/S2 +, no rubs, no gallops Respiratory: CTA bilaterally, no wheezing, no rhonchi Abdominal: Soft, NT, ND, bowel sounds  + Extremities: no edema, no cyanosis    The results of significant diagnostics from this hospitalization (including imaging, microbiology, ancillary and laboratory) are listed below for reference.     Microbiology: Recent Results (from the past 240 hour(s))  Resp Panel by RT-PCR (Flu A&B, Covid) Nasopharyngeal Swab     Status: Abnormal   Collection Time: 10/30/20  1:20 PM   Specimen: Nasopharyngeal Swab; Nasopharyngeal(NP) swabs in vial transport medium  Result Value Ref Range Status   SARS Coronavirus 2 by RT PCR POSITIVE (A) NEGATIVE Final    Comment: RESULT CALLED TO, READ BACK BY AND VERIFIED WITH: B. OAKLEY 5/20 @ 1406 BY S BEARD (NOTE) SARS-CoV-2 target nucleic acids are DETECTED.  The SARS-CoV-2 RNA is generally detectable in upper respiratory specimens during the acute phase of infection. Positive results are indicative of the presence of the identified virus, but do not rule out bacterial infection or co-infection with other pathogens not detected by the test. Clinical correlation with patient history and other diagnostic information is necessary to determine patient infection status. The expected result is Negative.  Fact Sheet for Patients: EntrepreneurPulse.com.au  Fact Sheet for Healthcare Providers: IncredibleEmployment.be  This test is not yet approved or cleared by the [place] FDA and  has been authorized for detection and/or diagnosis of SARS-CoV-2 by FDA under an Emergency Use Authorization (EUA).  This EUA will remain in effect (meaning this test can be  used) for the duration of  the COVID-19 declaration under Section 564(b)(1) of the Act, 21 U.S.C. section 360bbb-3(b)(1), unless the authorization is terminated or revoked sooner.     Influenza A by PCR NEGATIVE NEGATIVE Final   Influenza B by PCR NEGATIVE NEGATIVE Final    Comment: (NOTE) The Xpert Xpress SARS-CoV-2/FLU/RSV plus assay is intended as an aid in  the diagnosis of influenza from Nasopharyngeal swab specimens and should not be used as a sole basis for treatment. Nasal washings and aspirates are unacceptable for Xpert Xpress SARS-CoV-2/FLU/RSV testing.  Fact Sheet for Patients: EntrepreneurPulse.com.au  Fact Sheet for Healthcare Providers: IncredibleEmployment.be  This test is not yet approved or cleared by the [place] FDA and has been authorized for detection and/or diagnosis of SARS-CoV-2 by FDA under an Emergency Use Authorization (EUA). This EUA will remain in effect (meaning this test can be used) for the duration of the COVID-19 declaration under Section 564(b)(1) of the Act, 21 U.S.C. section 360bbb-3(b)(1), unless the authorization is terminated or revoked.  Performed at [facility], [address], [place], [place] [address]   Blood Culture (routine x 2)     Status: None (Preliminary result)   Collection Time: 10/30/20  4:08 PM   Specimen: BLOOD RIGHT HAND  Result Value Ref Range Status   Specimen Description BLOOD RIGHT HAND  Final   Special Requests   Final    Blood Culture adequate volume BOTTLES DRAWN AEROBIC AND ANAEROBIC   Culture   Final    NO GROWTH < 24 HOURS Performed at [facility], [address] [place], [place] [address]    Report Status PENDING  Incomplete  Blood Culture (routine x 2)     Status: None (Preliminary result)   Collection Time: 10/30/20  4:19 PM   Specimen: BLOOD RIGHT HAND  Result Value Ref Range Status   Specimen Description BLOOD RIGHT HAND  Final   Special Requests   Final    Blood Culture adequate volume BOTTLES DRAWN AEROBIC AND ANAEROBIC   Culture   Final    NO GROWTH < 24 HOURS Performed at [facility], [address], [place], [place] [address]    Report Status PENDING  Incomplete     Labs: BNP (last 3 results) No results for input(s): BNP in the last 8760 hours. Basic Metabolic Panel: Recent Labs  Lab 10/30/20 1304  10/30/20 1600 10/31/20 0729 11/01/20 0437  NA 137  --  138 136  K 3.0*  --  4.2 3.9  CL 98  --  98 96*  CO2 29  --  33* 34*  GLUCOSE 128*  --  108* 108*  BUN 18  --  21 27*  CREATININE 1.05*  --  0.85 0.72  CALCIUM 8.9  --  8.9 8.8*  MG  --  1.9 1.9  --    Liver Function Tests: Recent Labs  Lab 10/30/20 1304 10/31/20 0729 11/01/20 0437  AST 39 33 32  ALT 29 28 27   ALKPHOS 100 93 85  BILITOT 0.6 0.3 0.5  PROT 7.9 7.9 7.4  ALBUMIN 4.0 3.6 3.6   Recent Labs  Lab 10/30/20 1304  LIPASE 35   No results for input(s): AMMONIA in the last 168 hours. CBC: Recent Labs  Lab 10/30/20 1304 10/31/20 0601 11/01/20 0437  WBC 8.4 4.8 6.3  NEUTROABS 6.2 3.3 3.6  HGB 14.0 13.6 13.6  HCT 42.8 42.4 43.3  MCV 98.6 99.5 98.9  PLT 199 216 210   Cardiac Enzymes: No results for input(s): CKTOTAL, CKMB, CKMBINDEX, TROPONINI in the last 168 hours. BNP: Invalid input(s): POCBNP CBG: No results for input(s): GLUCAP in the last 168 hours. D-Dimer Recent Labs    10/31/20 0601 11/01/20 0437  DDIMER 0.94* 0.82*   Hgb A1c No results for input(s): HGBA1C in the last 72 hours. Lipid Profile Recent Labs    10/30/20 1600  TRIG 105   Thyroid function studies No results for input(s): TSH, T4TOTAL, T3FREE, THYROIDAB in the last 72 hours.  Invalid input(s): FREET3 Anemia work up Recent Labs    10/31/20 0729 11/01/20 0437  FERRITIN 118 135   Urinalysis    Component Value Date/Time   COLORURINE YELLOW 10/30/2020 1856   APPEARANCEUR HAZY (A) 10/30/2020 1856   LABSPEC 1.021 10/30/2020 1856   PHURINE 5.0 10/30/2020 1856   GLUCOSEU NEGATIVE 10/30/2020 1856   HGBUR NEGATIVE 10/30/2020 1856   BILIRUBINUR NEGATIVE 10/30/2020 1856   KETONESUR NEGATIVE 10/30/2020 1856   PROTEINUR NEGATIVE 10/30/2020 1856   UROBILINOGEN 0.2 12/01/2014 2012   NITRITE NEGATIVE 10/30/2020 1856   LEUKOCYTESUR NEGATIVE 10/30/2020 1856   Sepsis Labs Invalid input(s): PROCALCITONIN,  WBC,   LACTICIDVEN Microbiology Recent Results (from the past 240 hour(s))  Resp Panel by RT-PCR (Flu A&B, Covid) Nasopharyngeal Swab     Status: Abnormal   Collection Time: 10/30/20  1:20 PM   Specimen: Nasopharyngeal Swab; Nasopharyngeal(NP) swabs in vial transport medium  Result Value Ref Range Status   SARS Coronavirus 2 by RT PCR POSITIVE (A) NEGATIVE Final    Comment: RESULT CALLED TO, READ BACK BY AND VERIFIED WITH: B. OAKLEY 5/20 @ 1406 BY S BEARD (NOTE) SARS-CoV-2 target nucleic acids are DETECTED.  The SARS-CoV-2 RNA is generally detectable in upper respiratory specimens during the acute phase of infection. Positive results are indicative of the presence of the identified virus, but do not rule out bacterial infection or co-infection with other pathogens not detected by the test. Clinical correlation with patient history and other diagnostic information is necessary to determine patient infection status. The expected result is Negative.  Fact Sheet for Patients: EntrepreneurPulse.com.au  Fact Sheet for Healthcare Providers: IncredibleEmployment.be  This test is not yet approved or cleared by the [place] FDA and  has been authorized for detection and/or diagnosis of SARS-CoV-2 by FDA under an Emergency Use Authorization (EUA).  This EUA will remain in effect (meaning this test can be  used) for the duration of  the COVID-19 declaration under Section 564(b)(1) of the Act, 21 U.S.C. section 360bbb-3(b)(1), unless the authorization is terminated or revoked sooner.     Influenza A by PCR NEGATIVE NEGATIVE Final   Influenza B by PCR NEGATIVE NEGATIVE Final    Comment: (NOTE) The Xpert Xpress SARS-CoV-2/FLU/RSV plus assay is intended as an aid in the diagnosis of influenza from Nasopharyngeal swab specimens and should not be used as a sole basis for treatment. Nasal washings and aspirates are unacceptable for Xpert Xpress  SARS-CoV-2/FLU/RSV testing.  Fact Sheet for Patients: EntrepreneurPulse.com.au  Fact Sheet for Healthcare Providers: IncredibleEmployment.be  This test is not yet approved or cleared by the [place] FDA and has been authorized for detection and/or diagnosis of SARS-CoV-2 by FDA under an Emergency Use Authorization (EUA). This EUA will remain in effect (meaning this test can be used) for the duration of the COVID-19 declaration under Section 564(b)(1) of the Act, 21 U.S.C. section 360bbb-3(b)(1), unless the authorization is  terminated or revoked.  Performed at [facility], [address] [place], [place] [address]   Blood Culture (routine x 2)     Status: None (Preliminary result)   Collection Time: 10/30/20  4:08 PM   Specimen: BLOOD RIGHT HAND  Result Value Ref Range Status   Specimen Description BLOOD RIGHT HAND  Final   Special Requests   Final    Blood Culture adequate volume BOTTLES DRAWN AEROBIC AND ANAEROBIC   Culture   Final    NO GROWTH < 24 HOURS Performed at [facility], [address], [place], [place] [address]    Report Status PENDING  Incomplete  Blood Culture (routine x 2)     Status: None (Preliminary result)   Collection Time: 10/30/20  4:19 PM   Specimen: BLOOD RIGHT HAND  Result Value Ref Range Status   Specimen Description BLOOD RIGHT HAND  Final   Special Requests   Final    Blood Culture adequate volume BOTTLES DRAWN AEROBIC AND ANAEROBIC   Culture   Final    NO GROWTH < 24 HOURS Performed at [facility], [address] [place], [place] [address]    Report Status PENDING  Incomplete     Time coordinating discharge: 35 minutes  SIGNED:   Rodena Goldmann, DO [facility] 11/01/2020, 9:37 AM  If 7PM-7AM, please contact night-coverage [email or web address]

## 2020-11-01 NOTE — Progress Notes (Signed)
Nsg Discharge Note  Admit Date:  10/30/2020 Discharge date: 11/01/2020   ODELLA APPELHANS to be D/C'd Home per MD order.  AVS completed.  Copy for chart, and copy for patient signed, and dated. Patient/caregiver able to verbalize understanding. IV removed. Discharge paper work given and reviewed with patient. Patient stable upon discharge.  Discharge Medication: Allergies as of 11/01/2020      Reactions   Sulfonamide Derivatives Anaphylaxis   Morphine And Related Swelling   SWELLING REACTION UNSPECIFIED       Medication List    TAKE these medications   ALPRAZolam 1 MG tablet Commonly known as: XANAX Take 1 tablet (1 mg total) by mouth 3 (three) times daily as needed for anxiety.   ascorbic acid 500 MG tablet Commonly known as: VITAMIN C Take 1 tablet (500 mg total) by mouth daily.   aspirin 81 MG chewable tablet Chew 1 tablet (81 mg total) by mouth 2 (two) times daily.   dexamethasone 6 MG tablet Commonly known as: DECADRON Take 1 tablet (6 mg total) by mouth daily for 5 days.   docusate sodium 100 MG capsule Commonly known as: COLACE Take 1 capsule (100 mg total) by mouth 2 (two) times daily.   guaiFENesin-dextromethorphan 100-10 MG/5ML syrup Commonly known as: ROBITUSSIN DM Take 10 mLs by mouth every 4 (four) hours as needed for cough.   HYDROcodone-acetaminophen 5-325 MG tablet Commonly known as: NORCO/VICODIN Take 1-2 tablets by mouth every 4 (four) hours as needed for moderate pain (pain score 4-6).   ibuprofen 200 MG tablet Commonly known as: ADVIL Take 400 mg by mouth every 6 (six) hours as needed (hip pain.).   Ipratropium-Albuterol 20-100 MCG/ACT Aers respimat Commonly known as: COMBIVENT Inhale 1 puff into the lungs every 6 (six) hours as needed for wheezing or shortness of breath.   levothyroxine 100 MCG tablet Commonly known as: SYNTHROID Take 100 mcg by mouth daily before breakfast.   olmesartan-hydrochlorothiazide 40-25 MG tablet Commonly known as:  BENICAR HCT Take 1 tablet by mouth daily.   ondansetron 4 MG tablet Commonly known as: ZOFRAN Take 1 tablet (4 mg total) by mouth every 6 (six) hours as needed for nausea.   pantoprazole 40 MG tablet Commonly known as: PROTONIX Take 1 tablet (40 mg total) by mouth daily for 7 days.   zinc sulfate 220 (50 Zn) MG capsule Take 1 capsule (220 mg total) by mouth daily.       Discharge Assessment: Vitals:   11/01/20 0849 11/01/20 0956  BP:    Pulse:  64  Resp:  18  Temp:    SpO2: 97% 93%   Skin clean, dry and intact without evidence of skin break down, no evidence of skin tears noted. IV catheter discontinued intact. Site without signs and symptoms of complications - no redness or edema noted at insertion site, patient denies c/o pain - only slight tenderness at site.  Dressing with slight pressure applied.  D/c Instructions-Education: Discharge instructions given to patient/family with verbalized understanding. D/c education completed with patient/family including follow up instructions, medication list, d/c activities limitations if indicated, with other d/c instructions as indicated by MD - patient able to verbalize understanding, all questions fully answered. Patient instructed to return to ED, call 911, or call MD for any changes in condition.  Patient escorted via [place], and D/C home via private auto.  Zenaida Deed, RN 11/01/2020 11:26 AM

## 2020-11-02 DIAGNOSIS — R7301 Impaired fasting glucose: Secondary | ICD-10-CM | POA: Diagnosis not present

## 2020-11-02 DIAGNOSIS — E039 Hypothyroidism, unspecified: Secondary | ICD-10-CM | POA: Diagnosis not present

## 2020-11-02 DIAGNOSIS — I10 Essential (primary) hypertension: Secondary | ICD-10-CM | POA: Diagnosis not present

## 2020-11-02 DIAGNOSIS — U071 COVID-19: Secondary | ICD-10-CM | POA: Diagnosis not present

## 2020-11-02 DIAGNOSIS — R7303 Prediabetes: Secondary | ICD-10-CM | POA: Diagnosis not present

## 2020-11-02 DIAGNOSIS — E782 Mixed hyperlipidemia: Secondary | ICD-10-CM | POA: Diagnosis not present

## 2020-11-04 LAB — CULTURE, BLOOD (ROUTINE X 2)
Culture: NO GROWTH
Culture: NO GROWTH
Special Requests: ADEQUATE
Special Requests: ADEQUATE

## 2020-11-20 DIAGNOSIS — E1165 Type 2 diabetes mellitus with hyperglycemia: Secondary | ICD-10-CM | POA: Diagnosis not present

## 2020-11-20 DIAGNOSIS — I10 Essential (primary) hypertension: Secondary | ICD-10-CM | POA: Diagnosis not present

## 2020-12-03 DIAGNOSIS — B351 Tinea unguium: Secondary | ICD-10-CM | POA: Diagnosis not present

## 2021-01-10 DIAGNOSIS — E1165 Type 2 diabetes mellitus with hyperglycemia: Secondary | ICD-10-CM | POA: Diagnosis not present

## 2021-01-10 DIAGNOSIS — I10 Essential (primary) hypertension: Secondary | ICD-10-CM | POA: Diagnosis not present

## 2021-02-03 DIAGNOSIS — H2513 Age-related nuclear cataract, bilateral: Secondary | ICD-10-CM | POA: Diagnosis not present

## 2021-02-03 DIAGNOSIS — H40033 Anatomical narrow angle, bilateral: Secondary | ICD-10-CM | POA: Diagnosis not present

## 2021-02-10 DIAGNOSIS — I10 Essential (primary) hypertension: Secondary | ICD-10-CM | POA: Diagnosis not present

## 2021-02-10 DIAGNOSIS — E1165 Type 2 diabetes mellitus with hyperglycemia: Secondary | ICD-10-CM | POA: Diagnosis not present

## 2021-03-12 DIAGNOSIS — I10 Essential (primary) hypertension: Secondary | ICD-10-CM | POA: Diagnosis not present

## 2021-03-12 DIAGNOSIS — E1165 Type 2 diabetes mellitus with hyperglycemia: Secondary | ICD-10-CM | POA: Diagnosis not present

## 2021-04-12 DIAGNOSIS — E1165 Type 2 diabetes mellitus with hyperglycemia: Secondary | ICD-10-CM | POA: Diagnosis not present

## 2021-04-12 DIAGNOSIS — I10 Essential (primary) hypertension: Secondary | ICD-10-CM | POA: Diagnosis not present

## 2021-05-05 DIAGNOSIS — R7301 Impaired fasting glucose: Secondary | ICD-10-CM | POA: Diagnosis not present

## 2021-05-05 DIAGNOSIS — E039 Hypothyroidism, unspecified: Secondary | ICD-10-CM | POA: Diagnosis not present

## 2021-05-05 DIAGNOSIS — E782 Mixed hyperlipidemia: Secondary | ICD-10-CM | POA: Diagnosis not present

## 2021-05-11 DIAGNOSIS — Z0001 Encounter for general adult medical examination with abnormal findings: Secondary | ICD-10-CM | POA: Diagnosis not present

## 2021-05-11 DIAGNOSIS — R7303 Prediabetes: Secondary | ICD-10-CM | POA: Diagnosis not present

## 2021-05-11 DIAGNOSIS — Z23 Encounter for immunization: Secondary | ICD-10-CM | POA: Diagnosis not present

## 2021-05-11 DIAGNOSIS — Z1211 Encounter for screening for malignant neoplasm of colon: Secondary | ICD-10-CM | POA: Diagnosis not present

## 2021-05-11 DIAGNOSIS — R7301 Impaired fasting glucose: Secondary | ICD-10-CM | POA: Diagnosis not present

## 2021-05-11 DIAGNOSIS — I10 Essential (primary) hypertension: Secondary | ICD-10-CM | POA: Diagnosis not present

## 2021-05-11 DIAGNOSIS — E782 Mixed hyperlipidemia: Secondary | ICD-10-CM | POA: Diagnosis not present

## 2021-05-11 DIAGNOSIS — E039 Hypothyroidism, unspecified: Secondary | ICD-10-CM | POA: Diagnosis not present

## 2021-05-12 DIAGNOSIS — E1165 Type 2 diabetes mellitus with hyperglycemia: Secondary | ICD-10-CM | POA: Diagnosis not present

## 2021-05-12 DIAGNOSIS — E782 Mixed hyperlipidemia: Secondary | ICD-10-CM | POA: Diagnosis not present

## 2021-05-12 DIAGNOSIS — I10 Essential (primary) hypertension: Secondary | ICD-10-CM | POA: Diagnosis not present

## 2021-05-17 ENCOUNTER — Encounter: Payer: Self-pay | Admitting: Internal Medicine

## 2021-06-08 DIAGNOSIS — R059 Cough, unspecified: Secondary | ICD-10-CM | POA: Diagnosis not present

## 2021-06-08 DIAGNOSIS — J069 Acute upper respiratory infection, unspecified: Secondary | ICD-10-CM | POA: Diagnosis not present

## 2021-06-08 DIAGNOSIS — M6281 Muscle weakness (generalized): Secondary | ICD-10-CM | POA: Diagnosis not present

## 2021-06-08 DIAGNOSIS — J029 Acute pharyngitis, unspecified: Secondary | ICD-10-CM | POA: Diagnosis not present

## 2021-06-08 DIAGNOSIS — R509 Fever, unspecified: Secondary | ICD-10-CM | POA: Diagnosis not present

## 2021-06-11 DIAGNOSIS — E782 Mixed hyperlipidemia: Secondary | ICD-10-CM | POA: Diagnosis not present

## 2021-06-11 DIAGNOSIS — I10 Essential (primary) hypertension: Secondary | ICD-10-CM | POA: Diagnosis not present

## 2021-06-18 DIAGNOSIS — E079 Disorder of thyroid, unspecified: Secondary | ICD-10-CM | POA: Diagnosis not present

## 2021-06-18 DIAGNOSIS — Z885 Allergy status to narcotic agent status: Secondary | ICD-10-CM | POA: Diagnosis not present

## 2021-06-18 DIAGNOSIS — J029 Acute pharyngitis, unspecified: Secondary | ICD-10-CM | POA: Diagnosis not present

## 2021-06-18 DIAGNOSIS — J069 Acute upper respiratory infection, unspecified: Secondary | ICD-10-CM | POA: Diagnosis not present

## 2021-06-18 DIAGNOSIS — I10 Essential (primary) hypertension: Secondary | ICD-10-CM | POA: Diagnosis not present

## 2021-06-18 DIAGNOSIS — Z20822 Contact with and (suspected) exposure to covid-19: Secondary | ICD-10-CM | POA: Diagnosis not present

## 2021-06-18 DIAGNOSIS — Z7951 Long term (current) use of inhaled steroids: Secondary | ICD-10-CM | POA: Diagnosis not present

## 2021-06-18 DIAGNOSIS — M542 Cervicalgia: Secondary | ICD-10-CM | POA: Diagnosis not present

## 2021-07-01 DIAGNOSIS — E038 Other specified hypothyroidism: Secondary | ICD-10-CM | POA: Diagnosis not present

## 2021-07-01 DIAGNOSIS — I10 Essential (primary) hypertension: Secondary | ICD-10-CM | POA: Diagnosis not present

## 2021-07-13 DIAGNOSIS — I10 Essential (primary) hypertension: Secondary | ICD-10-CM | POA: Diagnosis not present

## 2021-07-13 DIAGNOSIS — E1165 Type 2 diabetes mellitus with hyperglycemia: Secondary | ICD-10-CM | POA: Diagnosis not present

## 2021-09-14 NOTE — Progress Notes (Deleted)
Primary Care Physician:  Benita Stabile, MD Primary Gastroenterologist:  Dr. Jena Gauss  No chief complaint on file.   HPI:   Karen [name] is a 76 y.o. female presenting today for ***. She has history of GERD, IBS-D, hemorrhoids.  Last colonoscopy in 2012 with 2 small tubular adenomas, minimal diverticulosis, suboptimal right colon prep.  Recommended 5-year repeat. We have not seen patient since 2014.  Today:    Past Medical History:  Diagnosis Date   Arthritis    right hip   Breast cancer (HCC) 2004   bilateral mastectomy-all left lymph nodes removed; chemo. Patient states she had "spot" on the right breast as well.   Diverticulitis    GERD (gastroesophageal reflux disease)    since gallbladder removed, no more GERD   Goiter    HTN (hypertension)    Hypothyroidism    Mental disorder    Major depressive disorder; generalized anxiety disorder; mood disorder   Pneumonia    Sleep apnea    has never been tested.  was dx with cancer first and had this problem on the back burner   Tubular adenoma     Past Surgical History:  Procedure Laterality Date   ABDOMINAL HYSTERECTOMY     bilateral mastectomy w/reconstruction  2004   BREAST SURGERY     CHOLECYSTECTOMY     COLONOSCOPY  2008   colon with few diverticula in sigmoid colon   COLONOSCOPY  02/16/2011   Dr. Jena Gauss- colon and rectal polyps= tubular adenoma, L sided diverticulosis.   DEBRIDEMENT AND CLOSURE WOUND N/A 12/17/2014   Procedure: CLOSURE WOUND LUMBAR SPINE;  Surgeon: Venita Lick, MD;  Location: [facility] OR;  Service: Orthopedics;  Laterality: N/A;   DILATION AND CURETTAGE OF UTERUS     ESOPHAGOGASTRODUODENOSCOPY  2008   lipoma distal esophagus, gastritis    ESOPHAGOGASTRODUODENOSCOPY  02/16/2011   Dr. Jena Gauss- normal esophagus, small hiatal hernia o/w normal stomach. normal first and second portion of the duodenum   HERNIA REPAIR     umbilical hernia was repaired   INJECTION KNEE Right 11/07/2014   Procedure: KNEE INJECTION;   Surgeon: Venita Lick, MD;  Location: [facility] OR;  Service: Orthopedics;  Laterality: Right;   LUMBAR LAMINECTOMY/DECOMPRESSION MICRODISCECTOMY N/A 09/25/2014   Procedure: DECOMPRESSION L3-L5 WITH INSERTION OF COFLEX   ( 2 LEVELS);  Surgeon: Venita Lick, MD;  Location: [facility] OR;  Service: Orthopedics;  Laterality: N/A;   LUMBAR LAMINECTOMY/DECOMPRESSION MICRODISCECTOMY N/A 11/07/2014   Procedure: IRRIGATION AND DEBRIDEMENT BACK WOUND, PLACEMENT OF ANTIBIOTIC BEADS;  Surgeon: Venita Lick, MD;  Location: [facility] OR;  Service: Orthopedics;  Laterality: N/A;   LUMBAR WOUND DEBRIDEMENT N/A 12/01/2014   Procedure: IRRIGATION AND DEBRIDEMENT OF LUMBAR WOUND WITH VAC PLACEMENT;  Surgeon: Venita Lick, MD;  Location: [facility] OR;  Service: Orthopedics;  Laterality: N/A;   LUMBAR WOUND DEBRIDEMENT N/A 12/03/2014   Procedure: WOUND VAC DRESSING CHANGE ;  Surgeon: Venita Lick, MD;  Location: [facility] OR;  Service: Orthopedics;  Laterality: N/A;   MALONEY DILATION  02/16/2011   Procedure: Elease Hashimoto DILATION;  Surgeon: Corbin Ade, MD;  Location: [facility];  Service: Endoscopy;  Laterality: N/A;   MASTECTOMY     bilateral 2004 left side had 17 nodes removed   PARTIAL HYSTERECTOMY     SAVORY DILATION  02/16/2011   Procedure: SAVORY DILATION;  Surgeon: Corbin Ade, MD;  Location: [facility];  Service: Endoscopy;  Laterality: N/A;   TOTAL HIP ARTHROPLASTY Right 02/29/2016   Procedure:  TOTAL HIP ARTHROPLASTY ANTERIOR APPROACH;  Surgeon: Samson Frederic, MD;  Location: [facility] OR;  Service: Orthopedics;  Laterality: Right;   TOTAL HIP ARTHROPLASTY Left 07/25/2019   Procedure: TOTAL HIP ARTHROPLASTY ANTERIOR APPROACH;  Surgeon: Samson Frederic, MD;  Location: [facility];  Service: Orthopedics;  Laterality: Left;    Current Outpatient Medications  Medication Sig Dispense Refill   ALPRAZolam (XANAX) 1 MG tablet Take 1 tablet (1 mg total) by mouth 3 (three) times daily as needed for anxiety. 90 tablet 5   ascorbic acid (VITAMIN C) 500 MG  tablet Take 1 tablet (500 mg total) by mouth daily. 30 tablet 0   aspirin 81 MG chewable tablet Chew 1 tablet (81 mg total) by mouth 2 (two) times daily. 60 tablet 1   docusate sodium (COLACE) 100 MG capsule Take 1 capsule (100 mg total) by mouth 2 (two) times daily. 60 capsule 1   guaiFENesin-dextromethorphan (ROBITUSSIN DM) 100-10 MG/5ML syrup Take 10 mLs by mouth every 4 (four) hours as needed for cough. 118 mL 0   HYDROcodone-acetaminophen (NORCO/VICODIN) 5-325 MG tablet Take 1-2 tablets by mouth every 4 (four) hours as needed for moderate pain (pain score 4-6). 30 tablet 0   ibuprofen (ADVIL) 200 MG tablet Take 400 mg by mouth every 6 (six) hours as needed (hip pain.).     Ipratropium-Albuterol (COMBIVENT) 20-100 MCG/ACT AERS respimat Inhale 1 puff into the lungs every 6 (six) hours as needed for wheezing or shortness of breath. 4 g 1   levothyroxine (SYNTHROID, LEVOTHROID) 100 MCG tablet Take 100 mcg by mouth daily before breakfast.   0   olmesartan-hydrochlorothiazide (BENICAR HCT) 40-25 MG tablet Take 1 tablet by mouth daily.     ondansetron (ZOFRAN) 4 MG tablet Take 1 tablet (4 mg total) by mouth every 6 (six) hours as needed for nausea. 20 tablet 0   pantoprazole (PROTONIX) 40 MG tablet Take 1 tablet (40 mg total) by mouth daily for 7 days. 7 tablet 0   zinc sulfate 220 (50 Zn) MG capsule Take 1 capsule (220 mg total) by mouth daily. 30 capsule 0   No current facility-administered medications for this visit.    Allergies as of 09/16/2021 - Review Complete 10/30/2020  Allergen Reaction Noted   Sulfonamide derivatives Anaphylaxis 08/30/2010   Morphine and related Swelling 09/23/2014    Family History  Problem Relation Age of Onset   Pancreatic cancer Mother        deceased   Aneurysm Father        deceased, aortic   Breast cancer Sister        deceased 8 years after initial diagnosis   Breast cancer Sister        survivor   Diverticulitis Brother        s/p partial colon  resection   Suicidality Brother    Cancer Son        lung   Colon cancer Neg Hx     Social History   Socioeconomic History   Marital status: Married    Spouse name: Not on file   Number of children: 3   Years of education: Not on file   Highest education level: Not on file  Occupational History   Occupation: retired    Associate Professor: RETIRED  Tobacco Use   Smoking status: Former    Packs/day: 1.00    Years: 20.00    Pack years: 20.00    Types: Cigarettes    Quit date: 09/24/1995    Years  since quitting: 25.9   Smokeless tobacco: Never   Tobacco comments:    Quit 1997  Vaping Use   Vaping Use: Never used  Substance and Sexual Activity   Alcohol use: No   Drug use: No   Sexual activity: Never  Other Topics Concern   Not on file  Social History Narrative   Not on file   Social Determinants of Health   Financial Resource Strain: Not on file  Food Insecurity: Not on file  Transportation Needs: Not on file  Physical Activity: Not on file  Stress: Not on file  Social Connections: Not on file  Intimate Partner Violence: Not on file    Review of Systems: Gen: Denies any fever, chills, cold or flulike symptoms, presyncope, syncope. CV: Denies chest pain, heart palpitations. Resp: Denies shortness of breath or cough. GI: See HPI GU : Denies urinary burning, urinary frequency, urinary hesitancy MS: Denies joint pain. Derm: Denies rash. Psych: Denies depression, anxiety. Heme: See HPI  Physical Exam: There were no vitals taken for this visit. General:   Alert and oriented. Pleasant and cooperative. Well-nourished and well-developed.  Head:  Normocephalic and atraumatic. Eyes:  Without icterus, sclera clear and conjunctiva pink.  Ears:  Normal auditory acuity. Lungs:  Clear to auscultation bilaterally. No wheezes, rales, or rhonchi. No distress.  Heart:  S1, S2 present without murmurs appreciated.  Abdomen:  +BS, soft, non-tender and non-distended. No HSM noted. No  guarding or rebound. No masses appreciated.  Rectal:  Deferred  Msk:  Symmetrical without gross deformities. Normal posture. Extremities:  Without edema. Neurologic:  Alert and  oriented x4;  grossly normal neurologically. Skin:  Intact without significant lesions or rashes. Psych: Normal mood and affect.    Assessment:     Plan:  ***   Ermalinda Memos, PA-C [facility] 09/16/2021

## 2021-09-16 ENCOUNTER — Ambulatory Visit: Payer: Medicare Other | Admitting: Gastroenterology

## 2021-09-16 ENCOUNTER — Encounter: Payer: Self-pay | Admitting: Internal Medicine

## 2021-09-30 DIAGNOSIS — I10 Essential (primary) hypertension: Secondary | ICD-10-CM | POA: Diagnosis not present

## 2021-09-30 DIAGNOSIS — E038 Other specified hypothyroidism: Secondary | ICD-10-CM | POA: Diagnosis not present

## 2021-09-30 DIAGNOSIS — Z0000 Encounter for general adult medical examination without abnormal findings: Secondary | ICD-10-CM | POA: Diagnosis not present

## 2021-10-06 DIAGNOSIS — L723 Sebaceous cyst: Secondary | ICD-10-CM | POA: Diagnosis not present

## 2021-10-08 DIAGNOSIS — F17210 Nicotine dependence, cigarettes, uncomplicated: Secondary | ICD-10-CM | POA: Diagnosis not present

## 2021-10-08 DIAGNOSIS — Z0389 Encounter for observation for other suspected diseases and conditions ruled out: Secondary | ICD-10-CM | POA: Diagnosis not present

## 2021-10-08 DIAGNOSIS — Z136 Encounter for screening for cardiovascular disorders: Secondary | ICD-10-CM | POA: Diagnosis not present

## 2021-10-08 DIAGNOSIS — Z8249 Family history of ischemic heart disease and other diseases of the circulatory system: Secondary | ICD-10-CM | POA: Diagnosis not present

## 2021-10-13 ENCOUNTER — Ambulatory Visit: Payer: Medicare Other | Admitting: Vascular Surgery

## 2021-10-13 ENCOUNTER — Other Ambulatory Visit: Payer: Self-pay | Admitting: Vascular Surgery

## 2021-10-13 ENCOUNTER — Ambulatory Visit (INDEPENDENT_AMBULATORY_CARE_PROVIDER_SITE_OTHER): Payer: Medicare Other

## 2021-10-13 ENCOUNTER — Other Ambulatory Visit (HOSPITAL_COMMUNITY): Payer: Self-pay | Admitting: Vascular Surgery

## 2021-10-13 DIAGNOSIS — I739 Peripheral vascular disease, unspecified: Secondary | ICD-10-CM

## 2021-11-10 ENCOUNTER — Encounter: Payer: Self-pay | Admitting: *Deleted

## 2022-01-06 DIAGNOSIS — E7849 Other hyperlipidemia: Secondary | ICD-10-CM | POA: Diagnosis not present

## 2022-01-06 DIAGNOSIS — L723 Sebaceous cyst: Secondary | ICD-10-CM | POA: Diagnosis not present

## 2022-01-06 DIAGNOSIS — C50919 Malignant neoplasm of unspecified site of unspecified female breast: Secondary | ICD-10-CM | POA: Diagnosis not present

## 2022-01-06 DIAGNOSIS — I10 Essential (primary) hypertension: Secondary | ICD-10-CM | POA: Diagnosis not present

## 2022-01-06 DIAGNOSIS — Z0000 Encounter for general adult medical examination without abnormal findings: Secondary | ICD-10-CM | POA: Diagnosis not present

## 2022-04-18 ENCOUNTER — Encounter: Payer: Self-pay | Admitting: *Deleted

## 2022-04-21 DIAGNOSIS — I10 Essential (primary) hypertension: Secondary | ICD-10-CM | POA: Diagnosis not present

## 2022-04-21 DIAGNOSIS — E7849 Other hyperlipidemia: Secondary | ICD-10-CM | POA: Diagnosis not present

## 2022-06-04 DIAGNOSIS — R059 Cough, unspecified: Secondary | ICD-10-CM | POA: Diagnosis not present

## 2022-06-04 DIAGNOSIS — E039 Hypothyroidism, unspecified: Secondary | ICD-10-CM | POA: Diagnosis not present

## 2022-06-04 DIAGNOSIS — Z79899 Other long term (current) drug therapy: Secondary | ICD-10-CM | POA: Diagnosis not present

## 2022-06-04 DIAGNOSIS — G8331 Monoplegia, unspecified affecting right dominant side: Secondary | ICD-10-CM | POA: Diagnosis not present

## 2022-06-04 DIAGNOSIS — Z7901 Long term (current) use of anticoagulants: Secondary | ICD-10-CM | POA: Diagnosis not present

## 2022-06-04 DIAGNOSIS — Z853 Personal history of malignant neoplasm of breast: Secondary | ICD-10-CM | POA: Diagnosis not present

## 2022-06-04 DIAGNOSIS — R29818 Other symptoms and signs involving the nervous system: Secondary | ICD-10-CM | POA: Diagnosis not present

## 2022-06-04 DIAGNOSIS — K529 Noninfective gastroenteritis and colitis, unspecified: Secondary | ICD-10-CM | POA: Diagnosis not present

## 2022-06-04 DIAGNOSIS — R7303 Prediabetes: Secondary | ICD-10-CM | POA: Diagnosis not present

## 2022-06-04 DIAGNOSIS — I639 Cerebral infarction, unspecified: Secondary | ICD-10-CM | POA: Diagnosis not present

## 2022-06-04 DIAGNOSIS — I6523 Occlusion and stenosis of bilateral carotid arteries: Secondary | ICD-10-CM | POA: Diagnosis not present

## 2022-06-04 DIAGNOSIS — G459 Transient cerebral ischemic attack, unspecified: Secondary | ICD-10-CM | POA: Diagnosis not present

## 2022-06-04 DIAGNOSIS — E876 Hypokalemia: Secondary | ICD-10-CM | POA: Diagnosis not present

## 2022-06-04 DIAGNOSIS — Z7982 Long term (current) use of aspirin: Secondary | ICD-10-CM | POA: Diagnosis not present

## 2022-06-04 DIAGNOSIS — Z7951 Long term (current) use of inhaled steroids: Secondary | ICD-10-CM | POA: Diagnosis not present

## 2022-06-04 DIAGNOSIS — Z20822 Contact with and (suspected) exposure to covid-19: Secondary | ICD-10-CM | POA: Diagnosis not present

## 2022-06-04 DIAGNOSIS — R531 Weakness: Secondary | ICD-10-CM | POA: Diagnosis not present

## 2022-06-04 DIAGNOSIS — I10 Essential (primary) hypertension: Secondary | ICD-10-CM | POA: Diagnosis not present

## 2022-06-07 DIAGNOSIS — I10 Essential (primary) hypertension: Secondary | ICD-10-CM | POA: Diagnosis not present

## 2022-06-07 DIAGNOSIS — E7849 Other hyperlipidemia: Secondary | ICD-10-CM | POA: Diagnosis not present

## 2022-06-07 DIAGNOSIS — Z8673 Personal history of transient ischemic attack (TIA), and cerebral infarction without residual deficits: Secondary | ICD-10-CM | POA: Diagnosis not present

## 2022-06-15 DIAGNOSIS — G319 Degenerative disease of nervous system, unspecified: Secondary | ICD-10-CM | POA: Diagnosis not present

## 2022-06-15 DIAGNOSIS — Z8673 Personal history of transient ischemic attack (TIA), and cerebral infarction without residual deficits: Secondary | ICD-10-CM | POA: Diagnosis not present

## 2022-06-15 DIAGNOSIS — Z1389 Encounter for screening for other disorder: Secondary | ICD-10-CM | POA: Diagnosis not present

## 2022-06-15 DIAGNOSIS — R531 Weakness: Secondary | ICD-10-CM | POA: Diagnosis not present

## 2022-06-15 DIAGNOSIS — I639 Cerebral infarction, unspecified: Secondary | ICD-10-CM | POA: Diagnosis not present

## 2022-06-15 DIAGNOSIS — R29898 Other symptoms and signs involving the musculoskeletal system: Secondary | ICD-10-CM | POA: Diagnosis not present

## 2022-08-04 DIAGNOSIS — E038 Other specified hypothyroidism: Secondary | ICD-10-CM | POA: Diagnosis not present

## 2022-08-04 DIAGNOSIS — I10 Essential (primary) hypertension: Secondary | ICD-10-CM | POA: Diagnosis not present

## 2022-08-04 DIAGNOSIS — Z0000 Encounter for general adult medical examination without abnormal findings: Secondary | ICD-10-CM | POA: Diagnosis not present

## 2022-08-04 DIAGNOSIS — K439 Ventral hernia without obstruction or gangrene: Secondary | ICD-10-CM | POA: Diagnosis not present

## 2022-08-04 DIAGNOSIS — Z8673 Personal history of transient ischemic attack (TIA), and cerebral infarction without residual deficits: Secondary | ICD-10-CM | POA: Diagnosis not present

## 2022-08-04 DIAGNOSIS — R7303 Prediabetes: Secondary | ICD-10-CM | POA: Diagnosis not present

## 2022-08-04 DIAGNOSIS — E7849 Other hyperlipidemia: Secondary | ICD-10-CM | POA: Diagnosis not present

## 2022-11-19 IMAGING — CT CT ABD-PELV W/O CM
2 of 4 series · 14 of 46 positions shown, 16 images · non-contrast
Comparison: CT abdomen and pelvis-01/24/2011

CLINICAL DATA: Acute non localized abdominal pain.

EXAM:
CT ABDOMEN AND PELVIS WITHOUT CONTRAST
TECHNIQUE: Multidetector CT imaging of the abdomen and pelvis was performed
following the standard protocol without IV contrast.

[Series 2: axial (person_name) (person_name) · axial · 0.86mm/px · z∈[+685,+1085]mm · 11 of 92 slices shown, 13 images]
[im 6/92  soft-tissue]
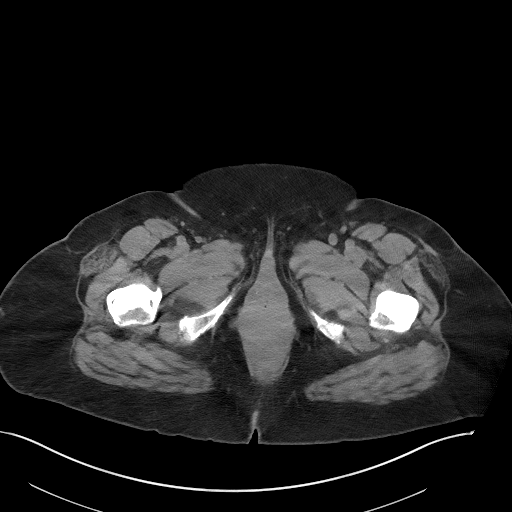
[im 6/92  bone]
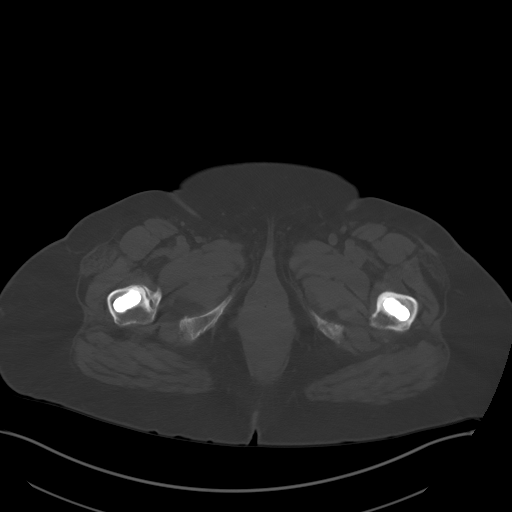
[im 16/92  soft-tissue]
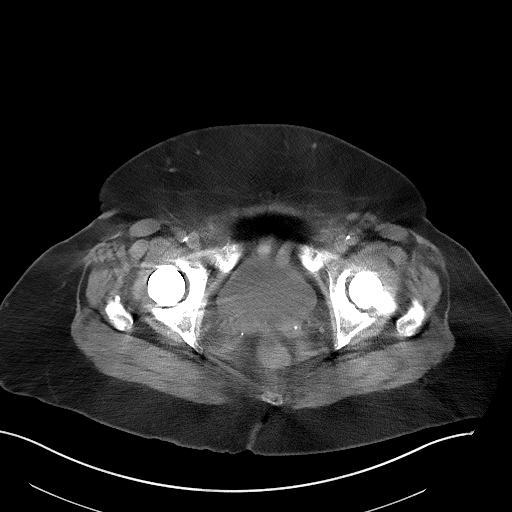
[im 21/92  soft-tissue]
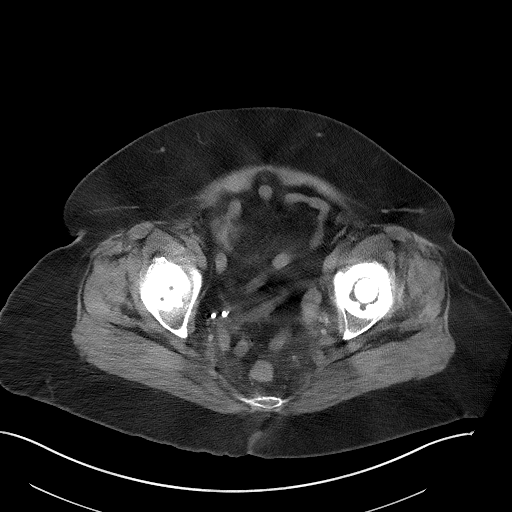
[im 31/92  soft-tissue]
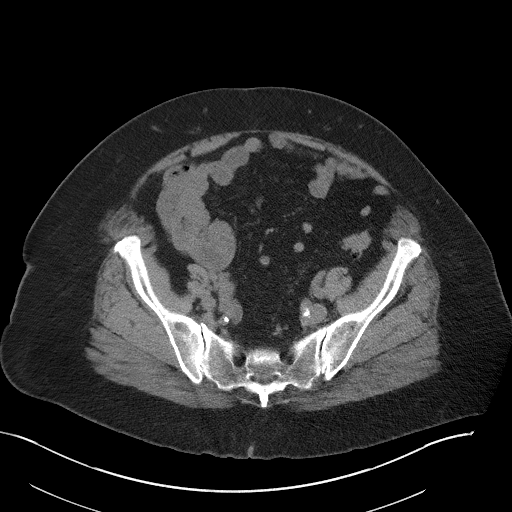
[im 36/92  soft-tissue]
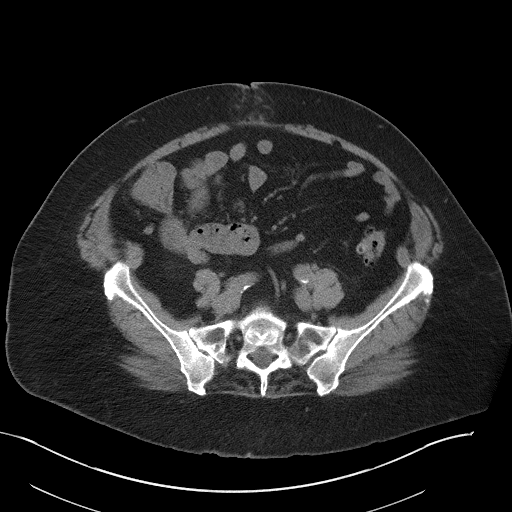
[im 46/92  soft-tissue]
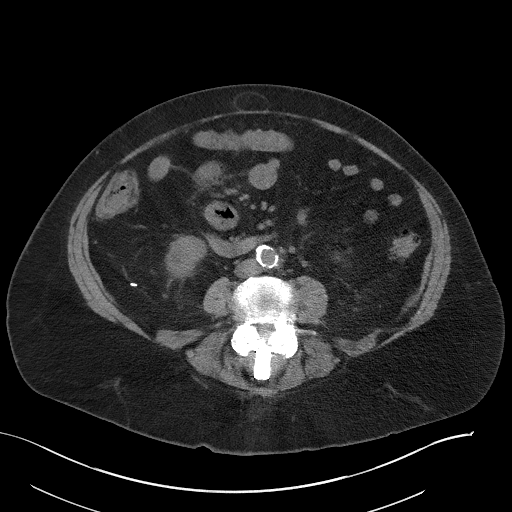
[im 56/92  soft-tissue]
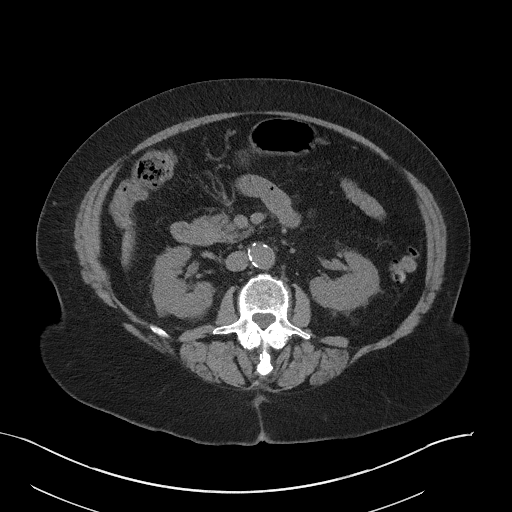
[im 61/92  soft-tissue]
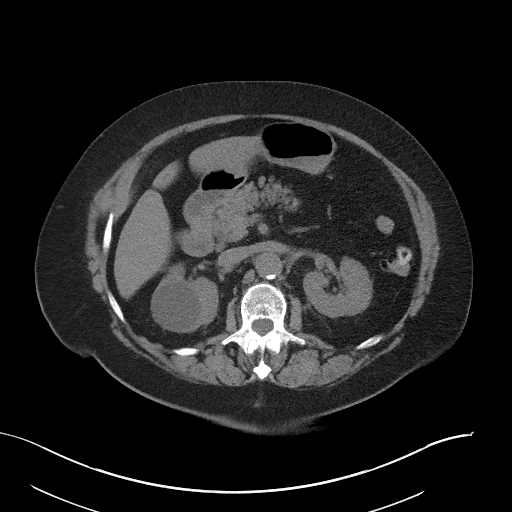
[im 71/92  soft-tissue]
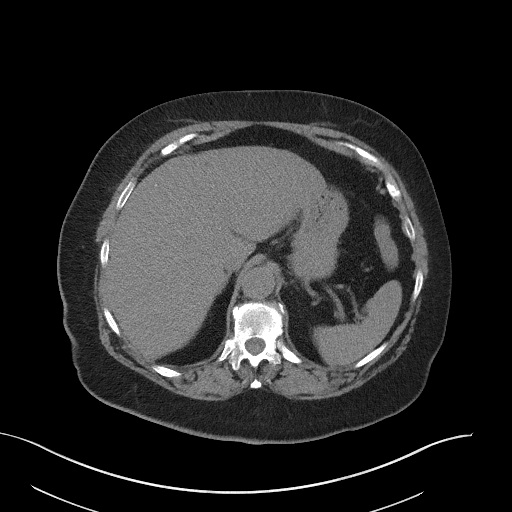
[im 71/92  bone]
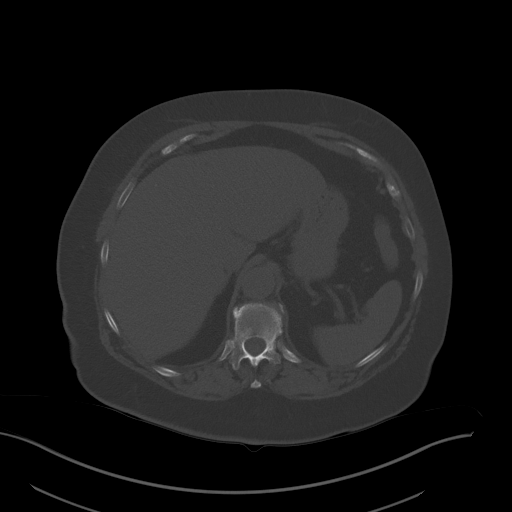
[im 76/92  soft-tissue]
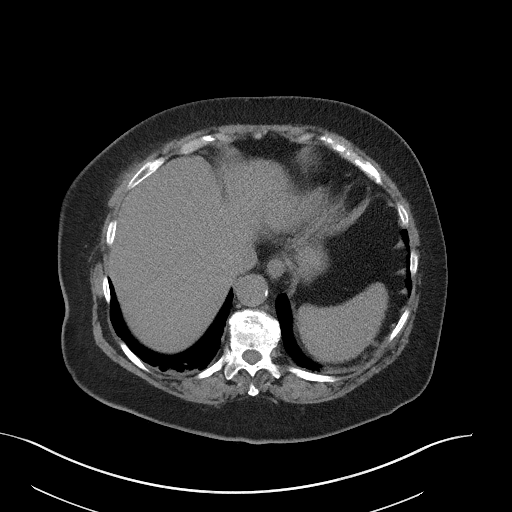
[im 86/92  soft-tissue]
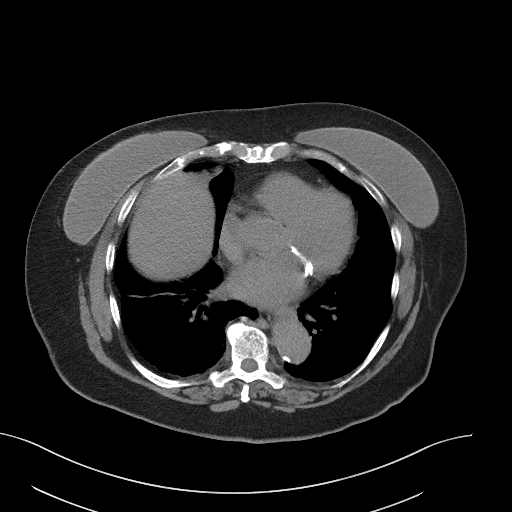

[Series 5: coronal st · coronal · 0.90mm/px · 3 of 118 slices shown]
[im 40/118  soft-tissue]
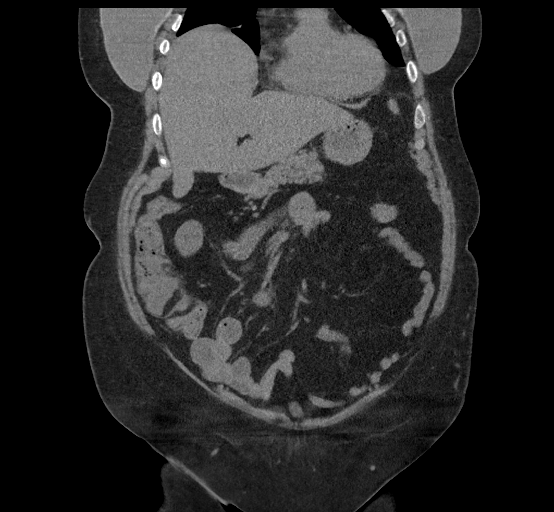
[im 53/118  soft-tissue]
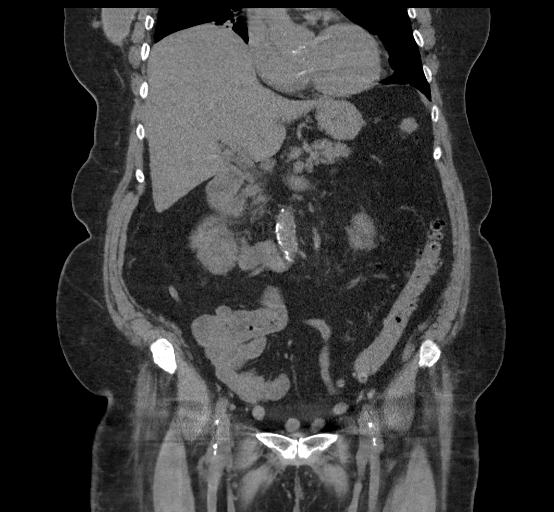
[im 66/118  soft-tissue]
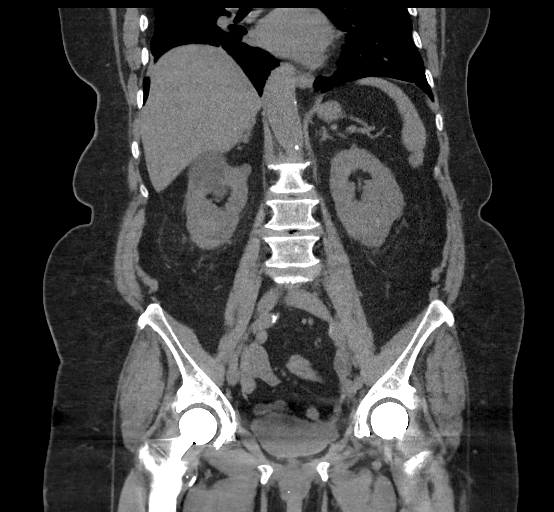

[14 of 46 positions shown; findings below may reference images not displayed]

FINDINGS: Lower chest: Limited visualization of the lower thorax demonstrates
subsegmental atelectasis involving the right middle and lower lobes.
No discrete focal airspace opacities. No pleural effusion.

Normal heart size. Coronary artery calcifications. Extensive
calcifications of the mitral valve annulus. No pericardial effusion.

Hepatobiliary: Normal hepatic contour. Punctate hypoattenuating
hepatic lesions (image 18 and 29, series 2), are too small to
accurately characterize though morphologically similar to remote
examination performed in 3563 and thus favored to represent hepatic
cysts. Note is made of a punctate granuloma within the subcapsular
aspect of the right lobe of the liver (image 22, series 2). Post
cholecystectomy. No ascites.

Pancreas: Normal noncontrast appearance of the pancreas.

Spleen: Normal noncontrast appearance of the spleen.

Adrenals/Urinary Tract: Note is made of an approximately 4.6 cm
hypoattenuating lesion arising from the superior pole the right
kidney which is incompletely characterized on the present
examination though favored to represent a renal cyst. There is an
additional punctate (approximately 0.8 cm) partially exophytic
lesion arising from the superior pole the right kidney which is too
small to adequately characterize though also favored to represent a
renal cyst. No definite left-sided renal lesions on this noncontrast
examination.

No renal stones. No renal stones are seen along the expected
location of either ureter or the urinary bladder. Normal noncontrast
appearance of the urinary bladder given underdistention. There is a
minimal amount of grossly symmetric bilateral likely age and body
habitus related perinephric stranding. No urinary obstruction.

Normal noncontrast appearance of the bilateral adrenal glands.

Stomach/Bowel: Scattered colonic diverticulosis without evidence of
superimposed acute diverticulitis. Potential mild circumferential
wall thickening involving loop of distal jejunum within the right
mid and lower abdomen (axial images 52 and 58, series 6; coronal
image 51, series 5), not resulting in enteric obstruction. Normal
noncontrast appearance of the terminal ileum and the retrocecal
appendix. No pneumoperitoneum, pneumatosis or portal venous gas.

Vascular/Lymphatic: Atherosclerotic plaque within a tortuous but
normal caliber abdominal aorta.

No bulky retroperitoneal, mesenteric, pelvic or inguinal
lymphadenopathy on this noncontrast examination.

Reproductive: Post hysterectomy.  No discrete adnexal lesions.

Other: Post bilateral breast augmentation. Note is made of a wide
neck mesenteric fat containing periumbilical hernia measuring 7.4 x
2.9 x 3.0 cm (sagittal image 74, series 6; axial image 55, series
2).

Musculoskeletal: No acute or aggressive osseous abnormalities. Post
bilateral total hip replacements without evidence of hardware
failure or loosening however the distal ends of the femoral stem
components was not imaged. Post fixation of the L3-L4 and L4-L5
spinous processes. Grade 1 anterolisthesis of L4 upon L5 without
associated pars defects. Mild to moderate multilevel lumbar spine
DDD, worse at L3-L4 with disc space height loss, endplate
irregularity and small posteriorly directed disc osteophyte complex
at this location.
IMPRESSION: 1. Potential mild circumferential wall thickening involving short
segment loop of the distal jejunum, not resulting in enteric
obstruction and while potentially artifactual due to peristalsis
conceivably a localized enteritis could have a similar appearance.
Clinical correlation is advised.
2. Otherwise no explanation for patient's acute non localized
abdominal pain. Specifically, no evidence of enteric or urinary
obstruction.
3. Colonic diverticulosis without evidence of superimposed acute
diverticulitis on this noncontrast examination.
4. Coronary calcifications.  Aortic Atherosclerosis ([id number]-[id number]).

## 2022-11-19 IMAGING — DX DG CHEST 1V PORT
1 series · 1 of 1 positions shown · non-contrast
Comparison: March 01, 2016

CLINICAL DATA: Shortness of breath.  Recent ZHOXR-YY positive

EXAM:
PORTABLE CHEST 1 VIEW

[chest ap]
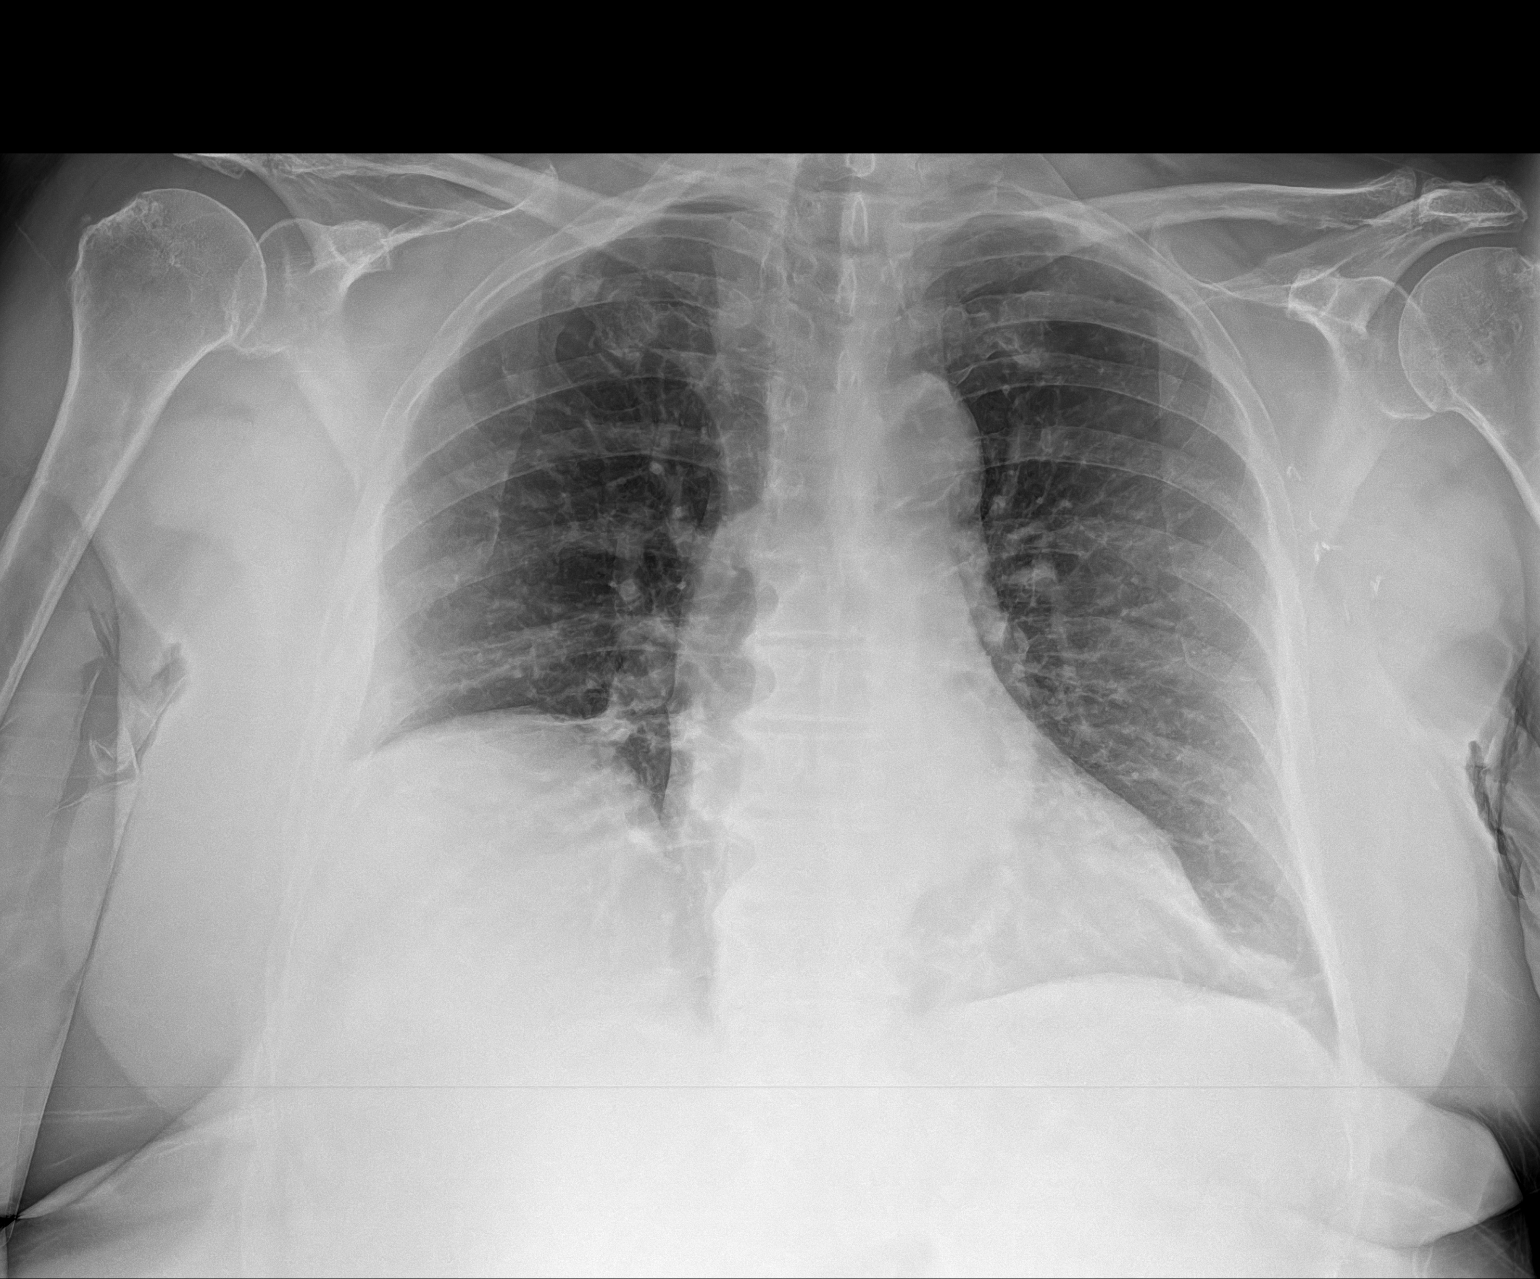

[1 of 1 positions shown; findings below may reference images not displayed]

FINDINGS: There is elevation of the right hemidiaphragm. There is mild
bibasilar atelectasis. There is no edema or airspace opacity. Heart
size and pulmonary vascularity are normal. No adenopathy. Aorta is
tortuous with aortic atherosclerosis. There are surgical clips in
the left axilla. There is degenerative change in each shoulder.
IMPRESSION: Mild bibasilar atelectasis. Elevation right hemidiaphragm. No edema
or airspace opacity. Heart size normal. Aortic Atherosclerosis
([id number]).

## 2022-12-19 DIAGNOSIS — E038 Other specified hypothyroidism: Secondary | ICD-10-CM | POA: Diagnosis not present

## 2022-12-19 DIAGNOSIS — Z8673 Personal history of transient ischemic attack (TIA), and cerebral infarction without residual deficits: Secondary | ICD-10-CM | POA: Diagnosis not present

## 2022-12-19 DIAGNOSIS — I10 Essential (primary) hypertension: Secondary | ICD-10-CM | POA: Diagnosis not present

## 2022-12-19 DIAGNOSIS — Z0000 Encounter for general adult medical examination without abnormal findings: Secondary | ICD-10-CM | POA: Diagnosis not present

## 2022-12-19 DIAGNOSIS — K439 Ventral hernia without obstruction or gangrene: Secondary | ICD-10-CM | POA: Diagnosis not present

## 2022-12-19 DIAGNOSIS — E7849 Other hyperlipidemia: Secondary | ICD-10-CM | POA: Diagnosis not present

## 2023-01-12 DIAGNOSIS — K439 Ventral hernia without obstruction or gangrene: Secondary | ICD-10-CM | POA: Diagnosis not present

## 2023-02-17 DIAGNOSIS — K439 Ventral hernia without obstruction or gangrene: Secondary | ICD-10-CM | POA: Diagnosis not present

## 2023-02-17 DIAGNOSIS — Z7982 Long term (current) use of aspirin: Secondary | ICD-10-CM | POA: Diagnosis not present

## 2023-02-17 DIAGNOSIS — G8918 Other acute postprocedural pain: Secondary | ICD-10-CM | POA: Diagnosis not present

## 2023-02-17 DIAGNOSIS — R0989 Other specified symptoms and signs involving the circulatory and respiratory systems: Secondary | ICD-10-CM | POA: Diagnosis not present

## 2023-02-17 DIAGNOSIS — Z79899 Other long term (current) drug therapy: Secondary | ICD-10-CM | POA: Diagnosis not present

## 2023-02-17 DIAGNOSIS — K430 Incisional hernia with obstruction, without gangrene: Secondary | ICD-10-CM | POA: Diagnosis not present

## 2023-02-17 DIAGNOSIS — R918 Other nonspecific abnormal finding of lung field: Secondary | ICD-10-CM | POA: Diagnosis not present

## 2023-02-17 DIAGNOSIS — K432 Incisional hernia without obstruction or gangrene: Secondary | ICD-10-CM | POA: Diagnosis not present

## 2023-02-17 DIAGNOSIS — K660 Peritoneal adhesions (postprocedural) (postinfection): Secondary | ICD-10-CM | POA: Diagnosis not present

## 2023-02-17 DIAGNOSIS — J9811 Atelectasis: Secondary | ICD-10-CM | POA: Diagnosis not present

## 2023-02-23 DIAGNOSIS — I10 Essential (primary) hypertension: Secondary | ICD-10-CM | POA: Diagnosis not present

## 2023-02-23 DIAGNOSIS — K439 Ventral hernia without obstruction or gangrene: Secondary | ICD-10-CM | POA: Diagnosis not present

## 2023-03-16 DIAGNOSIS — K439 Ventral hernia without obstruction or gangrene: Secondary | ICD-10-CM | POA: Diagnosis not present

## 2023-03-25 DIAGNOSIS — R030 Elevated blood-pressure reading, without diagnosis of hypertension: Secondary | ICD-10-CM | POA: Diagnosis not present

## 2023-03-25 DIAGNOSIS — H109 Unspecified conjunctivitis: Secondary | ICD-10-CM | POA: Diagnosis not present

## 2023-04-13 DIAGNOSIS — Z9889 Other specified postprocedural states: Secondary | ICD-10-CM | POA: Diagnosis not present

## 2023-04-13 DIAGNOSIS — Z8719 Personal history of other diseases of the digestive system: Secondary | ICD-10-CM | POA: Diagnosis not present

## 2023-04-13 DIAGNOSIS — S3092XA Unspecified superficial injury of abdominal wall, initial encounter: Secondary | ICD-10-CM | POA: Diagnosis not present

## 2023-04-13 DIAGNOSIS — R198 Other specified symptoms and signs involving the digestive system and abdomen: Secondary | ICD-10-CM | POA: Diagnosis not present

## 2023-05-09 DIAGNOSIS — E785 Hyperlipidemia, unspecified: Secondary | ICD-10-CM | POA: Diagnosis not present

## 2023-05-09 DIAGNOSIS — Z0000 Encounter for general adult medical examination without abnormal findings: Secondary | ICD-10-CM | POA: Diagnosis not present

## 2023-05-09 DIAGNOSIS — R7301 Impaired fasting glucose: Secondary | ICD-10-CM | POA: Diagnosis not present

## 2023-05-09 DIAGNOSIS — E039 Hypothyroidism, unspecified: Secondary | ICD-10-CM | POA: Diagnosis not present

## 2023-06-15 ENCOUNTER — Emergency Department (HOSPITAL_COMMUNITY)
Admission: EM | Admit: 2023-06-15 | Discharge: 2023-06-15 | Disposition: A | Discharge status: Home / Self Care | Payer: Medicare Other | Attending: Emergency Medicine | Admitting: Emergency Medicine

## 2023-06-15 ENCOUNTER — Other Ambulatory Visit: Payer: Self-pay

## 2023-06-15 ENCOUNTER — Emergency Department (HOSPITAL_COMMUNITY): Payer: Medicare Other

## 2023-06-15 ENCOUNTER — Encounter (HOSPITAL_COMMUNITY): Payer: Self-pay

## 2023-06-15 DIAGNOSIS — S61256A Open bite of right little finger without damage to nail, initial encounter: Secondary | ICD-10-CM | POA: Insufficient documentation

## 2023-06-15 DIAGNOSIS — M85841 Other specified disorders of bone density and structure, right hand: Secondary | ICD-10-CM | POA: Diagnosis not present

## 2023-06-15 DIAGNOSIS — Z853 Personal history of malignant neoplasm of breast: Secondary | ICD-10-CM | POA: Insufficient documentation

## 2023-06-15 DIAGNOSIS — E039 Hypothyroidism, unspecified: Secondary | ICD-10-CM | POA: Insufficient documentation

## 2023-06-15 DIAGNOSIS — S61011A Laceration without foreign body of right thumb without damage to nail, initial encounter: Secondary | ICD-10-CM | POA: Diagnosis not present

## 2023-06-15 DIAGNOSIS — W540XXA Bitten by dog, initial encounter: Secondary | ICD-10-CM | POA: Insufficient documentation

## 2023-06-15 DIAGNOSIS — Z7982 Long term (current) use of aspirin: Secondary | ICD-10-CM | POA: Insufficient documentation

## 2023-06-15 DIAGNOSIS — S61254A Open bite of right ring finger without damage to nail, initial encounter: Secondary | ICD-10-CM | POA: Insufficient documentation

## 2023-06-15 DIAGNOSIS — Z79890 Hormone replacement therapy (postmenopausal): Secondary | ICD-10-CM | POA: Insufficient documentation

## 2023-06-15 DIAGNOSIS — S61210A Laceration without foreign body of right index finger without damage to nail, initial encounter: Secondary | ICD-10-CM | POA: Diagnosis not present

## 2023-06-15 DIAGNOSIS — S61214A Laceration without foreign body of right ring finger without damage to nail, initial encounter: Secondary | ICD-10-CM | POA: Diagnosis not present

## 2023-06-15 MED ORDER — AMOXICILLIN-POT CLAVULANATE 875-125 MG PO TABS
1.0000 | ORAL_TABLET | Freq: Once | ORAL | Status: AC
  Administered 2023-06-15: 1 via ORAL
  Filled 2023-06-15: qty 1

## 2023-06-15 MED ORDER — LIDOCAINE HCL (PF) 1 % IJ SOLN
30.0000 mL | Freq: Once | INTRAMUSCULAR | Status: AC
  Administered 2023-06-15: 30 mL
  Filled 2023-06-15: qty 30

## 2023-06-15 MED ORDER — TRIPLE ANTIBIOTIC 3.5-400-5000 EX OINT
4.0000 | TOPICAL_OINTMENT | Freq: Once | CUTANEOUS | Status: AC
Start: 2023-06-15 — End: 2023-06-15
  Administered 2023-06-15: 4 via CUTANEOUS
  Filled 2023-06-15: qty 1

## 2023-06-15 MED ORDER — AMOXICILLIN-POT CLAVULANATE 875-125 MG PO TABS
1.0000 | ORAL_TABLET | Freq: Two times a day (BID) | ORAL | 0 refills | Status: AC
Start: 2023-06-15 — End: 2023-06-22

## 2023-06-15 MED ORDER — POVIDONE-IODINE 10 % EX SOLN
CUTANEOUS | Status: AC
  Filled 2023-06-15: qty 14.8

## 2023-06-15 MED ORDER — ACETAMINOPHEN 325 MG PO TABS
650.0000 mg | ORAL_TABLET | Freq: Once | ORAL | Status: AC
  Administered 2023-06-15: 650 mg via ORAL
  Filled 2023-06-15: qty 2

## 2023-06-15 NOTE — ED Triage Notes (Signed)
 Patient come in POV for an bite from her Dog, to the right ring and pinky finger.

## 2023-06-15 NOTE — Discharge Instructions (Addendum)
 As discussed, take Augmentin  twice a day for the next 7 days for infection prevention for your dog bite.   Sutures need to remain in place for the next 10-14 days.  Leave the dressing on your laceration for the next 24 hours. After that, you can leave the wound open to air. After the first 24 hours, you can begin to clean the wound site with soap and water  to prevent crusting over the suture knots.   Do not go into pools, lakes, or oceans until the sutures are removed in order to prevent infection.   You can go to your PCP, urgent care, or the ER to have the sutures removed.  Get help right away if: You have very bad swelling around the wound. Your pain suddenly gets worse and is very bad. You have painful lumps near the wound or on skin anywhere on your body. You have a red streak going away from your wound. The wound is on your hand or foot, and: You cannot move a finger or toe. Your fingers or toes look pale or bluish.

## 2023-06-15 NOTE — ED Provider Notes (Signed)
 [place] EMERGENCY DEPARTMENT AT [facility] Provider Note   CSN: [id number] Arrival date & time: 06/15/23  9096     History  Chief Complaint  Patient presents with   Animal Bite    Right ring and pinky finger.    Karen [name] is a 78 y.o. female with a history of breast cancer, hypothyroidism, and arthritis who presents the ED today for a dog bite.  Patient reports that around midnight her dog was chasing her son's cat around her house and when she tried it break up the fight her dog bit her right ring and pinky fingers.  Patient states that she has a 78 year old Chihuahua who's rabies vaccination is up-to-date.  Patient's tetanus is up-to-date.  Patient states that her son took her to [facility] ED after the incident since her finger was bleeding, but after waiting for 3 hours they decided to go home.  She has a laceration to the ring finger and abrasions to the pinky finger.  Patient takes Plavix daily.  She is soaking years and Betadine  at the time of evaluation.  Denies impaired range of motion, loss of sensation, numbness or tingling.  No additional complaints or concerns at this time.    Home Medications Prior to Admission medications   Medication Sig Start Date End Date Taking? Authorizing Provider  amoxicillin -clavulanate (AUGMENTIN ) 875-125 MG tablet Take 1 tablet by mouth every 12 (twelve) hours for 7 days. 06/15/23 06/22/23 Yes Waddell Sluder, PA-C  ALPRAZolam  (XANAX ) 1 MG tablet Take 1 tablet (1 mg total) by mouth 3 (three) times daily as needed for anxiety. 12/10/14   Landy Rome MATSU, MD  ascorbic acid  (VITAMIN C) 500 MG tablet Take 1 tablet (500 mg total) by mouth daily. 11/01/20   Maree, Pratik D, DO  aspirin  81 MG chewable tablet Chew 1 tablet (81 mg total) by mouth 2 (two) times daily. 07/26/19   Swinteck, Redell, MD  docusate sodium  (COLACE) 100 MG capsule Take 1 capsule (100 mg total) by mouth 2 (two) times daily. Patient not taking: Reported on 10/13/2021 07/26/19    Fidel Redell, MD  guaiFENesin -dextromethorphan (ROBITUSSIN DM) 100-10 MG/5ML syrup Take 10 mLs by mouth every 4 (four) hours as needed for cough. Patient not taking: Reported on 10/13/2021 11/01/20   Maree Bracken D, DO  HYDROcodone -acetaminophen  (NORCO/VICODIN) 5-325 MG tablet Take 1-2 tablets by mouth every 4 (four) hours as needed for moderate pain (pain score 4-6). Patient not taking: Reported on 10/13/2021 07/26/19   Fidel Redell, MD  ibuprofen (ADVIL) 200 MG tablet Take 400 mg by mouth every 6 (six) hours as needed (hip pain.). Patient not taking: Reported on 10/13/2021    [provider]  Ipratropium-Albuterol  (COMBIVENT ) 20-100 MCG/ACT AERS respimat Inhale 1 puff into the lungs every 6 (six) hours as needed for wheezing or shortness of breath. 11/01/20   Maree, Pratik D, DO  levothyroxine  (SYNTHROID , LEVOTHROID) 100 MCG tablet Take 100 mcg by mouth daily before breakfast.  08/29/14   [provider]  olmesartan -hydrochlorothiazide  (BENICAR  HCT) 40-25 MG tablet Take 1 tablet by mouth daily. 05/30/19   [provider]  ondansetron  (ZOFRAN ) 4 MG tablet Take 1 tablet (4 mg total) by mouth every 6 (six) hours as needed for nausea. Patient not taking: Reported on 10/13/2021 07/26/19   Fidel Redell, MD  pantoprazole  (PROTONIX ) 40 MG tablet Take 1 tablet (40 mg total) by mouth daily for 7 days. 11/01/20 11/08/20  Maree, Pratik D, DO  zinc  sulfate 220 (50 Zn)  MG capsule Take 1 capsule (220 mg total) by mouth daily. Patient not taking: Reported on 10/13/2021 11/01/20   Maree Bracken D, DO      Allergies    Sulfonamide derivatives and Morphine  and codeine    Review of Systems   Review of Systems  Skin:  Positive for wound.       Dog bite  All other systems reviewed and are negative.   Physical Exam Updated Vital Signs BP (!) 168/90   Pulse 74   Temp 98.7 F (37.1 C) (Oral)   Resp 17   Ht 5' 2 (1.575 m)   Wt 81.6 kg   SpO2 93%   BMI 32.92 kg/m  Physical Exam Vitals  and nursing note reviewed.  Constitutional:      General: She is not in acute distress.    Appearance: Normal appearance.  HENT:     Head: Normocephalic and atraumatic.     Mouth/Throat:     Mouth: Mucous membranes are moist.  Eyes:     Conjunctiva/sclera: Conjunctivae normal.     Pupils: Pupils are equal, round, and reactive to light.  Cardiovascular:     Rate and Rhythm: Normal rate and regular rhythm.     Pulses: Normal pulses.     Heart sounds: Normal heart sounds.  Pulmonary:     Effort: Pulmonary effort is normal.     Breath sounds: Normal breath sounds.  Abdominal:     Palpations: Abdomen is soft.     Tenderness: There is no abdominal tenderness.  Musculoskeletal:     Comments: ROM, strength, and sensation of right hand remains intact. No snuffbox tenderness. Capillary refill within 2 seconds. Palpable radial pulse.  Skin:    General: Skin is warm and dry.     Findings: No rash.     Comments: 5 cm laceration present at the pad of the right ring finger   Neurological:     General: No focal deficit present.     Mental Status: She is alert.     Sensory: No sensory deficit.     Motor: No weakness.  Psychiatric:        Mood and Affect: Mood normal.        Behavior: Behavior normal.    ED Results / Procedures / Treatments   Labs (all labs ordered are listed, but only abnormal results are displayed) Labs Reviewed - No data to display  EKG None  Radiology DG Hand Complete Right Result Date: 06/15/2023 CLINICAL DATA:  Laceration. EXAM: RIGHT HAND - COMPLETE 3 VIEW COMPARISON:  None Available. FINDINGS: Osteopenia. Extensive degenerative changes about the hand. Severe degenerative changes for example involving the first carpometacarpal joint with sclerosis, joint space loss and hypertrophic changes. Similar appearance to the interphalangeal joint of the thumb and distal interphalangeal joints of the second and third digit. More moderate changes elsewhere along the  interphalangeal joints. Neither severe location of the proximal interphalangeal joint of the fourth digit. No fracture or dislocation. No radiopaque foreign body. Please correlate for exact location of the laceration. Some soft tissue swelling about the fourth digit. IMPRESSION: Osteopenia with severe multifocal degenerative changes. Soft tissue swelling of the fourth digit. Electronically Signed   By: Ranell Bring M.D.   On: 06/15/2023 10:42    Procedures .Laceration Repair  Date/Time: 06/15/2023 11:06 AM  Performed by: Waddell Sluder, PA-C Authorized by: Waddell Sluder, PA-C   Consent:    Consent obtained:  Verbal   Consent given by:  Patient  Risks discussed:  Infection and pain Anesthesia:    Anesthesia method:  Nerve block   Block anesthetic:  Lidocaine  1% w/o epi Laceration details:    Location:  Finger   Finger location:  R ring finger   Length (cm):  5 Exploration:    Hemostasis achieved with:  Direct pressure   Imaging obtained: x-ray     Imaging outcome: foreign body not noted   Treatment:    Area cleansed with:  Saline and povidone-iodine  Skin repair:    Repair method:  Sutures   Suture size:  6-0   Suture material:  Prolene   Suture technique:  Simple interrupted   Number of sutures:  5 Approximation:    Approximation:  Loose Repair type:    Repair type:  Simple Post-procedure details:    Dressing:  Antibiotic ointment and non-adherent dressing   Procedure completion:  Tolerated well, no immediate complications     Medications Ordered in ED Medications  neomycin -bacitracin -polymyxin [phone] OINT 4 Application (has no administration in time range)  povidone-iodine  (BETADINE ) 10 % external solution (  Given 06/15/23 0923)  lidocaine  (PF) (XYLOCAINE ) 1 % injection 30 mL (30 mLs Infiltration Given by Other 06/15/23 0933)  acetaminophen  (TYLENOL ) tablet 650 mg (650 mg Oral Given 06/15/23 0933)  amoxicillin -clavulanate (AUGMENTIN ) 875-125 MG per tablet 1 tablet (1  tablet Oral Given 06/15/23 1021)    ED Course/ Medical Decision Making/ A&P                                 Medical Decision Making Amount and/or Complexity of Data Reviewed Radiology: ordered.  Risk OTC drugs. Prescription drug management.   This patient presents to the ED for concern of dog bite to finger, this involves an extensive number of treatment options, and is a complaint that carries with it a high risk of complications and morbidity.   Differential diagnosis includes: fracture, dislocation, laceration, abrasion, contusion, etc.   Comorbidities  See HPI above   Additional History  Additional history obtained from prior records.   Imaging Studies  I ordered imaging studies including right hand x-ray  I independently visualized and interpreted imaging which showed:  Osteopenia with severe multifocal degenerative changes. Soft tissue swelling of the 4th digit. I agree with the radiologist interpretation   Problem List / ED Course / Critical Interventions / Medication Management  Dog bite to right ring and pinky fingers. There's no laceration present at the pinky finger but there is 5 cm laceration noted at the pad of the right ring finger without nailbed involvement. Bleeding's controlled at the time of evaluation. I ordered medications including: Tylenol  for pain Augmentin  for dog bite  Bacitracin  for laceration Reevaluation of the patient after these medicines showed that the patient improved. Prescription for Augmentin  sent to the pharmacy.   Social Determinants of Health  Housing    Test / Admission - Considered  Discussed length with patient and son at bedside.  All questions were answered. She is stable and safe for discharge home. Return precautions given.       Final Clinical Impression(s) / ED Diagnoses Final diagnoses:  Dog bite, initial encounter    Rx / DC Orders ED Discharge Orders          Ordered     amoxicillin -clavulanate (AUGMENTIN ) 875-125 MG tablet  Every 12 hours        06/15/23 1056  Waddell Sluder, PA-C 06/15/23 1114    Towana Ozell BROCKS, MD 06/15/23 2110

## 2023-06-30 DIAGNOSIS — Z4802 Encounter for removal of sutures: Secondary | ICD-10-CM | POA: Diagnosis not present

## 2023-07-31 ENCOUNTER — Encounter: Payer: Self-pay | Admitting: General Surgery

## 2023-07-31 DIAGNOSIS — Z9889 Other specified postprocedural states: Secondary | ICD-10-CM

## 2023-07-31 DIAGNOSIS — S022XXA Fracture of nasal bones, initial encounter for closed fracture: Secondary | ICD-10-CM | POA: Diagnosis not present

## 2023-07-31 DIAGNOSIS — S82841A Displaced bimalleolar fracture of right lower leg, initial encounter for closed fracture: Secondary | ICD-10-CM | POA: Diagnosis not present

## 2023-07-31 DIAGNOSIS — S0240CA Maxillary fracture, right side, initial encounter for closed fracture: Secondary | ICD-10-CM | POA: Diagnosis not present

## 2023-07-31 DIAGNOSIS — S8251XA Displaced fracture of medial malleolus of right tibia, initial encounter for closed fracture: Secondary | ICD-10-CM | POA: Diagnosis not present

## 2023-07-31 DIAGNOSIS — S82431A Displaced oblique fracture of shaft of right fibula, initial encounter for closed fracture: Secondary | ICD-10-CM | POA: Diagnosis not present

## 2023-07-31 DIAGNOSIS — S0231XA Fracture of orbital floor, right side, initial encounter for closed fracture: Secondary | ICD-10-CM | POA: Diagnosis not present

## 2023-07-31 DIAGNOSIS — K661 Hemoperitoneum: Secondary | ICD-10-CM | POA: Diagnosis not present

## 2023-07-31 DIAGNOSIS — S32301A Unspecified fracture of right ilium, initial encounter for closed fracture: Secondary | ICD-10-CM | POA: Diagnosis not present

## 2023-07-31 DIAGNOSIS — K658 Other peritonitis: Secondary | ICD-10-CM | POA: Diagnosis not present

## 2023-07-31 DIAGNOSIS — Z4682 Encounter for fitting and adjustment of non-vascular catheter: Secondary | ICD-10-CM | POA: Diagnosis not present

## 2023-07-31 DIAGNOSIS — S91011A Laceration without foreign body, right ankle, initial encounter: Secondary | ICD-10-CM | POA: Diagnosis not present

## 2023-07-31 DIAGNOSIS — J432 Centrilobular emphysema: Secondary | ICD-10-CM | POA: Diagnosis not present

## 2023-07-31 DIAGNOSIS — S299XXA Unspecified injury of thorax, initial encounter: Secondary | ICD-10-CM | POA: Diagnosis not present

## 2023-07-31 DIAGNOSIS — S8261XA Displaced fracture of lateral malleolus of right fibula, initial encounter for closed fracture: Secondary | ICD-10-CM | POA: Diagnosis not present

## 2023-07-31 DIAGNOSIS — S72432A Displaced fracture of medial condyle of left femur, initial encounter for closed fracture: Secondary | ICD-10-CM | POA: Diagnosis not present

## 2023-07-31 DIAGNOSIS — I700 Atherosclerosis of aorta: Secondary | ICD-10-CM | POA: Diagnosis not present

## 2023-07-31 DIAGNOSIS — S0240DA Maxillary fracture, left side, initial encounter for closed fracture: Secondary | ICD-10-CM | POA: Diagnosis not present

## 2023-08-11 DIAGNOSIS — J209 Acute bronchitis, unspecified: Secondary | ICD-10-CM | POA: Diagnosis not present

## 2023-08-11 DIAGNOSIS — R0982 Postnasal drip: Secondary | ICD-10-CM | POA: Diagnosis not present

## 2023-08-11 DIAGNOSIS — E039 Hypothyroidism, unspecified: Secondary | ICD-10-CM | POA: Diagnosis not present

## 2023-08-11 DIAGNOSIS — I10 Essential (primary) hypertension: Secondary | ICD-10-CM | POA: Diagnosis not present

## 2023-08-11 DIAGNOSIS — L989 Disorder of the skin and subcutaneous tissue, unspecified: Secondary | ICD-10-CM | POA: Diagnosis not present

## 2023-08-27 ENCOUNTER — Other Ambulatory Visit: Payer: Self-pay

## 2023-09-14 DIAGNOSIS — X32XXXA Exposure to sunlight, initial encounter: Secondary | ICD-10-CM | POA: Diagnosis not present

## 2023-09-14 DIAGNOSIS — D2239 Melanocytic nevi of other parts of face: Secondary | ICD-10-CM | POA: Diagnosis not present

## 2023-09-14 DIAGNOSIS — L570 Actinic keratosis: Secondary | ICD-10-CM | POA: Diagnosis not present

## 2023-10-10 DIAGNOSIS — E039 Hypothyroidism, unspecified: Secondary | ICD-10-CM | POA: Diagnosis not present

## 2023-10-10 DIAGNOSIS — I10 Essential (primary) hypertension: Secondary | ICD-10-CM | POA: Diagnosis not present

## 2023-12-04 DIAGNOSIS — N3281 Overactive bladder: Secondary | ICD-10-CM | POA: Diagnosis not present

## 2023-12-04 DIAGNOSIS — Z79899 Other long term (current) drug therapy: Secondary | ICD-10-CM | POA: Diagnosis not present

## 2023-12-04 DIAGNOSIS — I10 Essential (primary) hypertension: Secondary | ICD-10-CM | POA: Diagnosis not present

## 2023-12-04 DIAGNOSIS — E039 Hypothyroidism, unspecified: Secondary | ICD-10-CM | POA: Diagnosis not present

## 2023-12-04 DIAGNOSIS — R7303 Prediabetes: Secondary | ICD-10-CM | POA: Diagnosis not present

## 2023-12-11 DIAGNOSIS — I10 Essential (primary) hypertension: Secondary | ICD-10-CM | POA: Diagnosis not present

## 2023-12-11 DIAGNOSIS — R7303 Prediabetes: Secondary | ICD-10-CM | POA: Diagnosis not present

## 2023-12-11 DIAGNOSIS — E039 Hypothyroidism, unspecified: Secondary | ICD-10-CM | POA: Diagnosis not present

## 2023-12-18 DIAGNOSIS — Z0001 Encounter for general adult medical examination with abnormal findings: Secondary | ICD-10-CM | POA: Diagnosis not present

## 2023-12-18 DIAGNOSIS — Z87891 Personal history of nicotine dependence: Secondary | ICD-10-CM | POA: Diagnosis not present

## 2023-12-18 DIAGNOSIS — I10 Essential (primary) hypertension: Secondary | ICD-10-CM | POA: Diagnosis not present

## 2023-12-18 DIAGNOSIS — E039 Hypothyroidism, unspecified: Secondary | ICD-10-CM | POA: Diagnosis not present

## 2023-12-18 DIAGNOSIS — R7303 Prediabetes: Secondary | ICD-10-CM | POA: Diagnosis not present

## 2023-12-18 DIAGNOSIS — H547 Unspecified visual loss: Secondary | ICD-10-CM | POA: Diagnosis not present

## 2023-12-18 DIAGNOSIS — N3281 Overactive bladder: Secondary | ICD-10-CM | POA: Diagnosis not present

## 2023-12-18 DIAGNOSIS — Z79899 Other long term (current) drug therapy: Secondary | ICD-10-CM | POA: Diagnosis not present

## 2024-01-09 DIAGNOSIS — H25813 Combined forms of age-related cataract, bilateral: Secondary | ICD-10-CM | POA: Diagnosis not present

## 2024-01-09 DIAGNOSIS — H01002 Unspecified blepharitis right lower eyelid: Secondary | ICD-10-CM | POA: Diagnosis not present

## 2024-01-09 DIAGNOSIS — H01001 Unspecified blepharitis right upper eyelid: Secondary | ICD-10-CM | POA: Diagnosis not present

## 2024-01-09 DIAGNOSIS — H01004 Unspecified blepharitis left upper eyelid: Secondary | ICD-10-CM | POA: Diagnosis not present

## 2024-02-05 DIAGNOSIS — H25811 Combined forms of age-related cataract, right eye: Secondary | ICD-10-CM | POA: Diagnosis not present

## 2024-02-09 ENCOUNTER — Encounter (HOSPITAL_COMMUNITY)
Admission: RE | Admit: 2024-02-09 | Discharge: 2024-02-09 | Disposition: A | Discharge status: Home / Self Care | Source: Ambulatory Visit | Attending: Ophthalmology | Admitting: Ophthalmology

## 2024-02-13 ENCOUNTER — Encounter (HOSPITAL_COMMUNITY): Payer: Self-pay

## 2024-02-14 NOTE — H&P (Signed)
 Surgical History & Physical  Patient Name: Karen [name]  DOB: July 03, 1945  Surgery: Cataract extraction with intraocular lens implant phacoemulsification; Right Eye Surgeon: Lynwood Hermann MD Surgery Date: 02/16/2024 Pre-Op Date: 01/09/2024  HPI: A 89 Yr. old female patient presents for the evaluation of visual changes. Patient states that she is unable to drive due to her current states of vision. Patient states that she feels like she has tunnel vision. Patient states that she believes OD is worse than OS. Patient denies any ocular pain but describes her eyes as sore. Patient states that she uses AT qhs OU and often wakes up with dry hard crusting of her lids. Denies any FOL or floaters. Denies any ocular trauma. Patient has significant trouble driving at night and does not do so anymore. This is negatively affecting the patient's quality of life and the patient is unable to function adequately in life with the current level of vision. HPI Completed by Dr. Lynwood Hermann  Medical History:  Thyroid  Problems Depression  Review of Systems Endocrine Thyroid  Disease Psychiatry Depression All recorded systems are negative except as noted above.  Social Never smoked  Medication Prednisolone-moxiflox-bromfen,  Levothyroxine , Alprazolam   Sx/Procedures Hip Replacement, Breast Cancer Sx  Drug Allergies  Sulfa  History & Physical: Heent: cataracts NECK: supple without bruits LUNGS: lungs clear to auscultation CV: regular rate and rhythm Abdomen: soft and non-tender  Impression & Plan: Assessment: 1.  COMBINED FORMS AGE RELATED CATARACT; Both Eyes (H25.813) 2.  Family History of AMD (Z84.89) 3.  BLEPHARITIS; Right Upper Lid, Right Lower Lid, Left Upper Lid, Left Lower Lid (H01.001, H01.002,H01.004,H01.005) 4.  DERMATOCHALASIS, no surgery; Right Upper Lid, Left Upper Lid (H02.831, H02.834) 5.  Pinguecula; Both Eyes (H11.153) 6.  ASTIGMATISM, REGULAR; Both Eyes (H52.223)  Plan: 1.   Cataract accounts for the patient's decreased vision. This visual impairment is not correctable with a tolerable change in glasses or contact lenses. Cataract surgery with an implantation of a new lens should significantly improve the visual and functional status of the patient.Discussed all risks, benefits, alternatives, and potential complications. Discussed the procedures and recovery. Patient desires to have surgery. A-scan ordered and performed today for intra-ocular lens calculations. The surgery will be performed in order to improve vision for driving, reading, and for eye examinations. Recommend phacoemulsification with intra-ocular lens. Recommend Dextenza  for post-operative pain and inflammation. History of refractive Surgery: None Use of Eye Pressure Lowering Drops: No Right Eye worse. OD first, then OS. Dilates well - shugarcaine or Lidocaine +Omidira by protocol Distance monofocal lens only.  2.  Mother. No signs of ARMD today.  3.  Blepharitis is present - recommend regular lid cleaning.  4.  Asymptomatic, recommend observation for now. Findings, prognosis and treatment options reviewed.  5.  Observe; Artificial tears as needed for irritation.  6.  Patient reports never having worn correction - will use standard IOL.

## 2024-02-15 NOTE — Anesthesia Preprocedure Evaluation (Signed)
 Anesthesia Evaluation  Patient identified by MRN, date of birth, ID band Patient awake    Reviewed: Allergy & Precautions, H&P , NPO status , Patient's Chart, lab work & pertinent test results, reviewed documented beta blocker date and time   Airway Mallampati: II  TM Distance: >3 FB Neck ROM: full    Dental no notable dental hx. (+) Dental Advisory Given, Teeth Intact   Pulmonary sleep apnea , former smoker   Pulmonary exam normal breath sounds clear to auscultation       Cardiovascular Exercise Tolerance: Good hypertension, Normal cardiovascular exam Rhythm:regular Rate:Normal     Neuro/Psych negative neurological ROS  negative psych ROS   GI/Hepatic Neg liver ROS,GERD  ,,  Endo/Other  Hypothyroidism    Renal/GU negative Renal ROS  negative genitourinary   Musculoskeletal  (+) Arthritis , Osteoarthritis,    Abdominal   Peds  Hematology negative hematology ROS (+)   Anesthesia Other Findings Breast cancer  Reproductive/Obstetrics negative OB ROS                              Anesthesia Physical Anesthesia Plan  ASA: 3  Anesthesia Plan: MAC   Post-op Pain Management: Minimal or no pain anticipated   Induction:   PONV Risk Score and Plan: Midazolam   Airway Management Planned: Nasal Cannula and Natural Airway  Additional Equipment: None  Intra-op Plan:   Post-operative Plan:   Informed Consent: I have reviewed the patients History and Physical, chart, labs and discussed the procedure including the risks, benefits and alternatives for the proposed anesthesia with the patient or authorized representative who has indicated his/her understanding and acceptance.     Dental Advisory Given  Plan Discussed with: CRNA  Anesthesia Plan Comments:          Anesthesia Quick Evaluation

## 2024-02-16 ENCOUNTER — Ambulatory Visit (HOSPITAL_COMMUNITY)
Admission: RE | Admit: 2024-02-16 | Discharge: 2024-02-16 | Disposition: A | Discharge status: Home / Self Care | Attending: Ophthalmology | Admitting: Ophthalmology

## 2024-02-16 ENCOUNTER — Ambulatory Visit (HOSPITAL_COMMUNITY): Admitting: Anesthesiology

## 2024-02-16 ENCOUNTER — Encounter (HOSPITAL_COMMUNITY)
Admission: RE | Disposition: A | Discharge status: Home / Self Care | Payer: Self-pay | Source: Home / Self Care | Attending: Ophthalmology

## 2024-02-16 ENCOUNTER — Encounter (HOSPITAL_COMMUNITY): Payer: Self-pay | Admitting: Ophthalmology

## 2024-02-16 ENCOUNTER — Ambulatory Visit (HOSPITAL_BASED_OUTPATIENT_CLINIC_OR_DEPARTMENT_OTHER): Admitting: Anesthesiology

## 2024-02-16 DIAGNOSIS — H25813 Combined forms of age-related cataract, bilateral: Secondary | ICD-10-CM | POA: Insufficient documentation

## 2024-02-16 DIAGNOSIS — H0100A Unspecified blepharitis right eye, upper and lower eyelids: Secondary | ICD-10-CM | POA: Insufficient documentation

## 2024-02-16 DIAGNOSIS — E039 Hypothyroidism, unspecified: Secondary | ICD-10-CM | POA: Insufficient documentation

## 2024-02-16 DIAGNOSIS — H02831 Dermatochalasis of right upper eyelid: Secondary | ICD-10-CM | POA: Diagnosis not present

## 2024-02-16 DIAGNOSIS — M1990 Unspecified osteoarthritis, unspecified site: Secondary | ICD-10-CM | POA: Insufficient documentation

## 2024-02-16 DIAGNOSIS — H0100B Unspecified blepharitis left eye, upper and lower eyelids: Secondary | ICD-10-CM | POA: Diagnosis not present

## 2024-02-16 DIAGNOSIS — G4730 Sleep apnea, unspecified: Secondary | ICD-10-CM | POA: Insufficient documentation

## 2024-02-16 DIAGNOSIS — H25811 Combined forms of age-related cataract, right eye: Secondary | ICD-10-CM | POA: Diagnosis not present

## 2024-02-16 DIAGNOSIS — I10 Essential (primary) hypertension: Secondary | ICD-10-CM | POA: Insufficient documentation

## 2024-02-16 DIAGNOSIS — H11153 Pinguecula, bilateral: Secondary | ICD-10-CM | POA: Diagnosis not present

## 2024-02-16 DIAGNOSIS — Z87891 Personal history of nicotine dependence: Secondary | ICD-10-CM | POA: Insufficient documentation

## 2024-02-16 DIAGNOSIS — H52223 Regular astigmatism, bilateral: Secondary | ICD-10-CM | POA: Insufficient documentation

## 2024-02-16 DIAGNOSIS — H02834 Dermatochalasis of left upper eyelid: Secondary | ICD-10-CM | POA: Insufficient documentation

## 2024-02-16 DIAGNOSIS — K219 Gastro-esophageal reflux disease without esophagitis: Secondary | ICD-10-CM | POA: Diagnosis not present

## 2024-02-16 HISTORY — PX: CATARACT EXTRACTION W/PHACO: SHX586

## 2024-02-16 SURGERY — PHACOEMULSIFICATION, CATARACT, WITH IOL INSERTION
Anesthesia: Monitor Anesthesia Care | Site: Eye | Laterality: Right

## 2024-02-16 MED ORDER — LACTATED RINGERS IV SOLN: INTRAVENOUS | Status: DC

## 2024-02-16 MED ORDER — STERILE WATER FOR IRRIGATION IR SOLN
Status: DC | PRN
  Administered 2024-02-16: 1

## 2024-02-16 MED ORDER — BSS IO SOLN
INTRAOCULAR | Status: DC | PRN
  Administered 2024-02-16: 15 mL via INTRAOCULAR

## 2024-02-16 MED ORDER — TROPICAMIDE 1 % OP SOLN
1.0000 [drp] | OPHTHALMIC | Status: AC | PRN
  Administered 2024-02-16 (×3): 1 [drp] via OPHTHALMIC

## 2024-02-16 MED ORDER — MIDAZOLAM HCL 2 MG/2ML IJ SOLN
INTRAMUSCULAR | Status: DC | PRN
  Administered 2024-02-16 (×2): .5 mg via INTRAVENOUS

## 2024-02-16 MED ORDER — SODIUM CHLORIDE 0.9% FLUSH
INTRAVENOUS | Status: DC | PRN
  Administered 2024-02-16 (×2): 3 mL via INTRAVENOUS

## 2024-02-16 MED ORDER — MOXIFLOXACIN HCL 5 MG/ML IO SOLN
INTRAOCULAR | Status: DC | PRN
  Administered 2024-02-16: .2 mL via INTRACAMERAL

## 2024-02-16 MED ORDER — PHENYLEPHRINE-KETOROLAC 1-0.3 % IO SOLN
INTRAOCULAR | Status: DC | PRN
  Administered 2024-02-16: 500 mL via OPHTHALMIC

## 2024-02-16 MED ORDER — TETRACAINE HCL 0.5 % OP SOLN
1.0000 [drp] | OPHTHALMIC | Status: AC | PRN
Start: 2024-02-16 — End: 2024-02-16
  Administered 2024-02-16 (×3): 1 [drp] via OPHTHALMIC

## 2024-02-16 MED ORDER — POVIDONE-IODINE 5 % OP SOLN
OPHTHALMIC | Status: DC | PRN
  Administered 2024-02-16: 1 via OPHTHALMIC

## 2024-02-16 MED ORDER — LIDOCAINE HCL (PF) 1 % IJ SOLN
INTRAMUSCULAR | Status: DC | PRN
  Administered 2024-02-16: 1 mL

## 2024-02-16 MED ORDER — SODIUM HYALURONATE 23MG/ML IO SOSY
PREFILLED_SYRINGE | INTRAOCULAR | Status: DC | PRN
  Administered 2024-02-16: .6 mL via INTRAOCULAR

## 2024-02-16 MED ORDER — PHENYLEPHRINE HCL 2.5 % OP SOLN
1.0000 [drp] | OPHTHALMIC | Status: AC | PRN
  Administered 2024-02-16 (×3): 1 [drp] via OPHTHALMIC

## 2024-02-16 MED ORDER — SODIUM HYALURONATE 10 MG/ML IO SOLUTION
PREFILLED_SYRINGE | INTRAOCULAR | Status: DC | PRN
  Administered 2024-02-16: .85 mL via INTRAOCULAR

## 2024-02-16 MED ORDER — LIDOCAINE HCL 3.5 % OP GEL
1.0000 | Freq: Once | OPHTHALMIC | Status: AC
  Administered 2024-02-16: 1 via OPHTHALMIC

## 2024-02-16 MED ORDER — MIDAZOLAM HCL 2 MG/2ML IJ SOLN
INTRAMUSCULAR | Status: AC
  Filled 2024-02-16: qty 2

## 2024-02-16 SURGICAL SUPPLY — 11 items
CLOTH BEACON ORANGE TIMEOUT ST (SAFETY) ×1 IMPLANT
EYE SHIELD UNIVERSAL CLEAR (GAUZE/BANDAGES/DRESSINGS) IMPLANT
FEE CATARACT SUITE SIGHTPATH (MISCELLANEOUS) ×1 IMPLANT
GLOVE BIOGEL PI IND STRL 7.0 (GLOVE) ×2 IMPLANT
LENS IOL TECNIS EYHANCE 18.5 (Intraocular Lens) IMPLANT
NDL HYPO 18GX1.5 BLUNT FILL (NEEDLE) ×1 IMPLANT
NEEDLE HYPO 18GX1.5 BLUNT FILL (NEEDLE) ×1 IMPLANT
PAD ARMBOARD POSITIONER FOAM (MISCELLANEOUS) ×1 IMPLANT
SYR TB 1ML LL NO SAFETY (SYRINGE) ×1 IMPLANT
TAPE SURG TRANSPORE 1 IN (GAUZE/BANDAGES/DRESSINGS) IMPLANT
WATER STERILE IRR 250ML POUR (IV SOLUTION) ×1 IMPLANT

## 2024-02-16 NOTE — Discharge Instructions (Signed)
 Please discharge patient when stable, will follow up today with Dr. June Leap at the [facility] office immediately following discharge.  Leave shield in place until visit.  All paperwork with discharge instructions will be given at the office.  [facility] Address:  [address]  [place], [place] [address]

## 2024-02-16 NOTE — Interval H&P Note (Signed)
 History and Physical Interval Note:  02/16/2024 7:45 AM  Karen [name]  has presented today for surgery, with the diagnosis of combined forms age related cataract, right eye.  The various methods of treatment have been discussed with the patient and family. After consideration of risks, benefits and other options for treatment, the patient has consented to  Procedure(s) with comments: PHACOEMULSIFICATION, CATARACT, WITH IOL INSERTION (Right) - CDE: as a surgical intervention.  The patient's history has been reviewed, patient examined, no change in status, stable for surgery.  I have reviewed the patient's chart and labs.  Questions were answered to the patient's satisfaction.     HARRIE AGENT

## 2024-02-16 NOTE — Anesthesia Procedure Notes (Signed)
 Date/Time: 02/16/2024 7:54 AM  Performed by: Para Jerelene CROME, CRNAOxygen Delivery Method: Nasal cannula

## 2024-02-16 NOTE — Transfer of Care (Addendum)
 Immediate Anesthesia Transfer of Care Note  Patient: Karen [name]  Procedure(s) Performed: PHACOEMULSIFICATION, CATARACT, WITH IOL INSERTION (Right: Eye)  Patient Location: Short Stay  Anesthesia Type:MAC  Level of Consciousness: awake and patient cooperative  Airway & Oxygen Therapy: Patient Spontanous Breathing  Post-op Assessment: Report given to RN and Post -op Vital signs reviewed and stable  Post vital signs: Reviewed and stable  Last Vitals:  Vitals Value Taken Time  BP 192/107 02/16/24   0813  Temp 36.8 09/05/025 0813    Pulse 76 02/16/24   0813  Resp 18 02/16/24   0813  SpO2 93% 02/16/24   0813    Last Pain:  Vitals:   02/16/24 0701  TempSrc: Oral  PainSc: 0-No pain      Patients Stated Pain Goal: 5 (02/16/24 0701)  Complications: No notable events documented.

## 2024-02-16 NOTE — Op Note (Signed)
 Date of procedure: 02/16/24  Pre-operative diagnosis:  Visually significant combined form age-related cataract, Right Eye (H25.811)  Post-operative diagnosis:  Visually significant combined form age-related cataract, Right Eye (H25.811)  Procedure: Removal of cataract via phacoemulsification and insertion of intra-ocular lens Vicci and Johnson DIB00 +18.5D into the capsular bag of the Right Eye  Attending surgeon: Lynwood LABOR. Careem Yasui, MD, MA  Anesthesia: MAC, Topical Akten   Complications: None  Estimated Blood Loss: <17mL (minimal)  Specimens: None  Implants: As above  Indications:  Visually significant age-related cataract, Right Eye  Procedure:  The patient was seen and identified in the pre-operative area. The operative eye was identified and dilated.  The operative eye was marked.  Topical anesthesia was administered to the operative eye.     The patient was then to the operative suite and placed in the supine position.  A timeout was performed confirming the patient, procedure to be performed, and all other relevant information.   The patient's face was prepped and draped in the usual fashion for intra-ocular surgery.  A lid speculum was placed into the operative eye and the surgical microscope moved into place and focused.  A superotemporal paracentesis was created using a 20 gauge paracentesis blade. Omidria  was injected into the anterior chamber. Shugarcaine was injected into the anterior chamber.  Viscoelastic was injected into the anterior chamber.  A temporal clear-corneal main wound incision was created using a 2.52mm microkeratome.  A continuous curvilinear capsulorrhexis was initiated using an irrigating cystitome and completed using capsulorrhexis forceps.  Hydrodissection and hydrodeliniation were performed.  Viscoelastic was injected into the anterior chamber.  A phacoemulsification handpiece and a chopper as a second instrument were used to remove the nucleus and epinucleus.  The irrigation/aspiration handpiece was used to remove any remaining cortical material.   The capsular bag was reinflated with viscoelastic, checked, and found to be intact.  The intraocular lens was inserted into the capsular bag.  The irrigation/aspiration handpiece was used to remove any remaining viscoelastic.  The clear corneal wound and paracentesis wounds were then hydrated and checked with Weck-Cels to be watertight. 0.1mL of Moxfloxacin was injected into the anterior chamber. The lid-speculum was removed.  The drape was removed.  The patient's face was cleaned with a wet and dry 4x4. A clear shield was taped over the eye. The patient was taken to the post-operative care unit in good condition, having tolerated the procedure well.  Post-Op Instructions: The patient will follow up at [facility] for a same day post-operative evaluation and will receive all other orders and instructions.

## 2024-02-16 NOTE — Anesthesia Postprocedure Evaluation (Signed)
 Anesthesia Post Note  Patient: Karen [name]  Procedure(s) Performed: PHACOEMULSIFICATION, CATARACT, WITH IOL INSERTION (Right: Eye)  Patient location during evaluation: Phase II Anesthesia Type: MAC Level of consciousness: awake and alert Pain management: pain level controlled Vital Signs Assessment: post-procedure vital signs reviewed and stable Respiratory status: spontaneous breathing, nonlabored ventilation, respiratory function stable and patient connected to nasal cannula oxygen Cardiovascular status: stable and blood pressure returned to baseline Postop Assessment: no apparent nausea or vomiting Anesthetic complications: no   There were no known notable events for this encounter.   Last Vitals:  Vitals:   02/16/24 0701 02/16/24 0813  BP: (!) 180/96 (!) 192/107  Pulse: 74 76  Resp: (!) 24 18  Temp: 36.8 C 36.8 C  SpO2: 91% 93%    Last Pain:  Vitals:   02/16/24 0813  TempSrc:   PainSc: 0-No pain                 Kalon Erhardt L Tiamarie Furnari

## 2024-02-19 ENCOUNTER — Encounter (HOSPITAL_COMMUNITY): Payer: Self-pay | Admitting: Ophthalmology

## 2024-02-23 ENCOUNTER — Encounter (HOSPITAL_COMMUNITY): Payer: Self-pay

## 2024-02-23 ENCOUNTER — Encounter (HOSPITAL_COMMUNITY)
Admission: RE | Admit: 2024-02-23 | Discharge: 2024-02-23 | Disposition: A | Discharge status: Home / Self Care | Source: Ambulatory Visit | Attending: Ophthalmology | Admitting: Ophthalmology

## 2024-02-23 ENCOUNTER — Other Ambulatory Visit: Payer: Self-pay

## 2024-02-26 DIAGNOSIS — H25812 Combined forms of age-related cataract, left eye: Secondary | ICD-10-CM | POA: Diagnosis not present

## 2024-02-28 NOTE — H&P (Signed)
 Surgical History & Physical  Patient Name: Karen [name]  DOB: July 26, 1945  Surgery: Cataract extraction with intraocular lens implant phacoemulsification; Left Eye Surgeon: Lynwood Hermann MD Surgery Date: 03/01/2024 Pre-Op Date: 02/22/2024  HPI: A 6 Yr. old female patient 1. The patient is returning for a 6 day cataract follow-up of the right eye. Pre- op -OS. Since the last visit, the affected area is doing well. The patient's vision is improved. The condition's severity is constant. Patient is following medication instructions. Pt. ready is ready to proceed with cataract surgery on OS. The patient has had blurry vision in the left eye for several months. The blurry vision is constant and worsening. This is negatively affecting the patient's quality of life and the patient is unable to function adequately in life with the current level of vision. HPI Completed by Dr. Lynwood Hermann  Medical History: PCIOL OD  Thyroid  Problems  Review of Systems Psychiatry Depression All recorded systems are negative except as noted above.  Social Never smoked  Medication Prednisolone-moxiflox-bromfen, Prednisolone-moxiflox-bromfen,  Levothyroxine , Alprazolam   Sx/Procedures Phaco c IOL OD,  Hip Replacement, Breast Cancer Sx  Drug Allergies  Sulfa  History & Physical: Heent: cataract NECK: supple without bruits LUNGS: lungs clear to auscultation CV: regular rate and rhythm Abdomen: soft and non-tender  Impression & Plan: Assessment: 1.  CATARACT EXTRACTION STATUS; Right Eye (Z98.41) 2.  COMBINED FORMS AGE RELATED CATARACT; Left Eye (H25.812)  Plan: 1.  1 week after cataract surgery. Doing well with improved vision and normal eye pressure. Call with any problems or concerns. Continue Pred-Moxi-Brom 2x/day for 3 more weeks.  2.  Cataract accounts for the patient's decreased vision. This visual impairment is not correctable with a tolerable change in glasses or contact lenses. Cataract  surgery with an implantation of a new lens should significantly improve the visual and functional status of the patient. Discussed all risks, benefits, alternatives, and potential complications. Discussed the procedures and recovery. Patient desires to have surgery. A-scan ordered and performed today for intra-ocular lens calculations. The surgery will be performed in order to improve vision for driving, reading, and for eye examinations. Recommend phacoemulsification with intra-ocular lens. Recommend Dextenza  for post-operative pain and inflammation. History of refractive Surgery: None Use of Eye Pressure Lowering Drops: No Left Eye. Surgery required to correct imbalance in vision. Dilates well - shugarcaine or Lidocaine +Omidira by protocol Distance monofocal lens only.

## 2024-03-01 ENCOUNTER — Other Ambulatory Visit: Payer: Self-pay

## 2024-03-01 ENCOUNTER — Ambulatory Visit (HOSPITAL_COMMUNITY)
Admission: RE | Admit: 2024-03-01 | Discharge: 2024-03-01 | Disposition: A | Discharge status: Home / Self Care | Attending: Ophthalmology | Admitting: Ophthalmology

## 2024-03-01 ENCOUNTER — Encounter (HOSPITAL_COMMUNITY)
Admission: RE | Disposition: A | Discharge status: Home / Self Care | Payer: Self-pay | Source: Home / Self Care | Attending: Ophthalmology

## 2024-03-01 ENCOUNTER — Ambulatory Visit (HOSPITAL_COMMUNITY): Admitting: Anesthesiology

## 2024-03-01 ENCOUNTER — Encounter (HOSPITAL_COMMUNITY): Payer: Self-pay | Admitting: Ophthalmology

## 2024-03-01 DIAGNOSIS — K219 Gastro-esophageal reflux disease without esophagitis: Secondary | ICD-10-CM | POA: Insufficient documentation

## 2024-03-01 DIAGNOSIS — H25812 Combined forms of age-related cataract, left eye: Secondary | ICD-10-CM | POA: Diagnosis not present

## 2024-03-01 DIAGNOSIS — G4730 Sleep apnea, unspecified: Secondary | ICD-10-CM | POA: Diagnosis not present

## 2024-03-01 DIAGNOSIS — I10 Essential (primary) hypertension: Secondary | ICD-10-CM

## 2024-03-01 DIAGNOSIS — E039 Hypothyroidism, unspecified: Secondary | ICD-10-CM | POA: Insufficient documentation

## 2024-03-01 DIAGNOSIS — Z87891 Personal history of nicotine dependence: Secondary | ICD-10-CM

## 2024-03-01 HISTORY — PX: CATARACT EXTRACTION W/PHACO: SHX586

## 2024-03-01 SURGERY — PHACOEMULSIFICATION, CATARACT, WITH IOL INSERTION
Anesthesia: Monitor Anesthesia Care | Site: Eye | Laterality: Left

## 2024-03-01 MED ORDER — POVIDONE-IODINE 5 % OP SOLN
OPHTHALMIC | Status: DC | PRN
  Administered 2024-03-01: 1 via OPHTHALMIC

## 2024-03-01 MED ORDER — STERILE WATER FOR IRRIGATION IR SOLN: Status: DC | PRN

## 2024-03-01 MED ORDER — LIDOCAINE HCL 3.5 % OP GEL
1.0000 | Freq: Once | OPHTHALMIC | Status: AC
  Administered 2024-03-01: 1 via OPHTHALMIC

## 2024-03-01 MED ORDER — LACTATED RINGERS IV SOLN: INTRAVENOUS | Status: DC

## 2024-03-01 MED ORDER — SODIUM HYALURONATE 10 MG/ML IO SOLUTION
PREFILLED_SYRINGE | INTRAOCULAR | Status: DC | PRN
  Administered 2024-03-01: .85 mL via INTRAOCULAR

## 2024-03-01 MED ORDER — TROPICAMIDE 1 % OP SOLN
1.0000 [drp] | OPHTHALMIC | Status: DC | PRN
  Administered 2024-03-01 (×2): 1 [drp] via OPHTHALMIC

## 2024-03-01 MED ORDER — SODIUM CHLORIDE 0.9% FLUSH
INTRAVENOUS | Status: DC | PRN
  Administered 2024-03-01: 8 mL via INTRAVENOUS

## 2024-03-01 MED ORDER — MOXIFLOXACIN HCL 5 MG/ML IO SOLN
INTRAOCULAR | Status: DC | PRN
  Administered 2024-03-01: .2 mL via INTRACAMERAL

## 2024-03-01 MED ORDER — BSS IO SOLN
INTRAOCULAR | Status: DC | PRN
  Administered 2024-03-01: 15 mL via INTRAOCULAR

## 2024-03-01 MED ORDER — LIDOCAINE HCL (PF) 1 % IJ SOLN
INTRAMUSCULAR | Status: DC | PRN
  Administered 2024-03-01: 2 mL

## 2024-03-01 MED ORDER — PHENYLEPHRINE-KETOROLAC 1-0.3 % IO SOLN
INTRAOCULAR | Status: DC | PRN
  Administered 2024-03-01: 500 mL via OPHTHALMIC

## 2024-03-01 MED ORDER — MIDAZOLAM HCL 2 MG/2ML IJ SOLN
INTRAMUSCULAR | Status: AC
  Filled 2024-03-01: qty 2

## 2024-03-01 MED ORDER — MIDAZOLAM HCL 2 MG/2ML IJ SOLN
INTRAMUSCULAR | Status: DC | PRN
  Administered 2024-03-01: 1 mg via INTRAVENOUS

## 2024-03-01 MED ORDER — PHENYLEPHRINE HCL 2.5 % OP SOLN
1.0000 [drp] | OPHTHALMIC | Status: DC | PRN
  Administered 2024-03-01 (×2): 1 [drp] via OPHTHALMIC

## 2024-03-01 MED ORDER — TETRACAINE HCL 0.5 % OP SOLN
1.0000 [drp] | OPHTHALMIC | Status: AC | PRN
  Administered 2024-03-01 (×3): 1 [drp] via OPHTHALMIC

## 2024-03-01 MED ORDER — STERILE WATER FOR IRRIGATION IR SOLN
Status: DC | PRN
  Administered 2024-03-01: 250 mL

## 2024-03-01 MED ORDER — SODIUM HYALURONATE 23MG/ML IO SOSY
PREFILLED_SYRINGE | INTRAOCULAR | Status: DC | PRN
  Administered 2024-03-01: .6 mL via INTRAOCULAR

## 2024-03-01 SURGICAL SUPPLY — 11 items
CLOTH BEACON ORANGE TIMEOUT ST (SAFETY) ×1 IMPLANT
EYE SHIELD UNIVERSAL CLEAR (GAUZE/BANDAGES/DRESSINGS) IMPLANT
FEE CATARACT SUITE SIGHTPATH (MISCELLANEOUS) ×1 IMPLANT
GLOVE BIOGEL PI IND STRL 7.0 (GLOVE) ×2 IMPLANT
LENS IOL TECNIS EYHANCE 19.0 (Intraocular Lens) IMPLANT
NDL HYPO 18GX1.5 BLUNT FILL (NEEDLE) ×1 IMPLANT
NEEDLE HYPO 18GX1.5 BLUNT FILL (NEEDLE) ×1 IMPLANT
PAD ARMBOARD POSITIONER FOAM (MISCELLANEOUS) ×1 IMPLANT
SYR TB 1ML LL NO SAFETY (SYRINGE) ×1 IMPLANT
TAPE SURG TRANSPORE 1 IN (GAUZE/BANDAGES/DRESSINGS) IMPLANT
WATER STERILE IRR 250ML POUR (IV SOLUTION) ×1 IMPLANT

## 2024-03-01 NOTE — Op Note (Signed)
 Date of procedure: 03/01/24  Pre-operative diagnosis: Visually significant age-related combined cataract, Left Eye (H25.812)  Post-operative diagnosis: Visually significant age-related combined cataract, Left Eye (H25.812)  Procedure: Removal of cataract via phacoemulsification and insertion of intra-ocular lens Vicci and Johnson DIB00 +19.0D into the capsular bag of the Left Eye  Attending surgeon: Lynwood LABOR. Anaisa Radi, MD, MA  Anesthesia: MAC, Topical Akten   Complications: None  Estimated Blood Loss: <23mL (minimal)  Specimens: None  Implants: As above  Indications:  Visually significant age-related cataract, Left Eye  Procedure:  The patient was seen and identified in the pre-operative area. The operative eye was identified and dilated.  The operative eye was marked.  Topical anesthesia was administered to the operative eye.     The patient was then to the operative suite and placed in the supine position.  A timeout was performed confirming the patient, procedure to be performed, and all other relevant information.   The patient's face was prepped and draped in the usual fashion for intra-ocular surgery.  A lid speculum was placed into the operative eye and the surgical microscope moved into place and focused.  An inferotemporal paracentesis was created using a 20 gauge paracentesis blade. Omidria  was injected into the anterior chamber. Shugarcaine was injected into the anterior chamber.  Viscoelastic was injected into the anterior chamber.  A temporal clear-corneal main wound incision was created using a 2.4mm microkeratome.  A continuous curvilinear capsulorrhexis was initiated using an irrigating cystitome and completed using capsulorrhexis forceps.  Hydrodissection and hydrodeliniation were performed.  Viscoelastic was injected into the anterior chamber.  A phacoemulsification handpiece and a chopper as a second instrument were used to remove the nucleus and epinucleus. The  irrigation/aspiration handpiece was used to remove any remaining cortical material.   The capsular bag was reinflated with viscoelastic, checked, and found to be intact.  The intraocular lens was inserted into the capsular bag.  The irrigation/aspiration handpiece was used to remove any remaining viscoelastic.  The clear corneal wound and paracentesis wounds were then hydrated and checked with Weck-Cels to be watertight. 0.1mL of Moxfloxacin was injected into the anterior chamber. The lid-speculum was removed.  The drape was removed.  The patient's face was cleaned with a wet and dry 4x4.    A clear shield was taped over the eye. The patient was taken to the post-operative care unit in good condition, having tolerated the procedure well.  Post-Op Instructions: The patient will follow up at [facility] for a same day post-operative evaluation and will receive all other orders and instructions.

## 2024-03-01 NOTE — Discharge Instructions (Addendum)
 Please discharge patient when stable, will follow up today with Dr. June Leap at the [facility] office immediately following discharge.  Leave shield in place until visit.  All paperwork with discharge instructions will be given at the office.  [facility] Address:  [address]  [place], [place] [address]

## 2024-03-01 NOTE — Transfer of Care (Signed)
 Immediate Anesthesia Transfer of Care Note  Patient: Karen [name]  Procedure(s) Performed: PHACOEMULSIFICATION, CATARACT, WITH IOL INSERTION (Left: Eye)  Patient Location: Short Stay  Anesthesia Type:MAC  Level of Consciousness: awake and alert   Airway & Oxygen Therapy: Patient Spontanous Breathing  Post-op Assessment: Report given to RN and Post -op Vital signs reviewed and stable  Post vital signs: Reviewed and stable  Last Vitals:  Vitals Value Taken Time  BP 157/95 03/01/24 11:52  Temp 36.8 C 03/01/24 11:52  Pulse 78 03/01/24 11:52  Resp 20 03/01/24 11:52  SpO2 98 % 03/01/24 11:52    Last Pain:  Vitals:   03/01/24 1152  TempSrc: Oral  PainSc: 0-No pain      Patients Stated Pain Goal: 5 (03/01/24 1111)  Complications: No notable events documented.

## 2024-03-01 NOTE — Anesthesia Preprocedure Evaluation (Signed)
 Anesthesia Evaluation  Patient identified by MRN, date of birth, ID band Patient awake    Reviewed: Allergy & Precautions, H&P , NPO status , Patient's Chart, lab work & pertinent test results, reviewed documented beta blocker date and time   Airway Mallampati: II  TM Distance: >3 FB Neck ROM: full    Dental  (+) Dental Advisory Given, Upper Dentures   Pulmonary sleep apnea , former smoker   Pulmonary exam normal breath sounds clear to auscultation       Cardiovascular Exercise Tolerance: Good hypertension, Normal cardiovascular exam Rhythm:regular Rate:Normal     Neuro/Psych negative neurological ROS  negative psych ROS   GI/Hepatic Neg liver ROS,GERD  ,,  Endo/Other  Hypothyroidism    Renal/GU negative Renal ROS  negative genitourinary   Musculoskeletal  (+) Arthritis , Osteoarthritis,    Abdominal   Peds  Hematology negative hematology ROS (+)   Anesthesia Other Findings Breast cancer  Reproductive/Obstetrics negative OB ROS                              Anesthesia Physical Anesthesia Plan  ASA: 3  Anesthesia Plan: MAC   Post-op Pain Management: Minimal or no pain anticipated   Induction:   PONV Risk Score and Plan: Midazolam   Airway Management Planned: Nasal Cannula and Natural Airway  Additional Equipment: None  Intra-op Plan:   Post-operative Plan:   Informed Consent: I have reviewed the patients History and Physical, chart, labs and discussed the procedure including the risks, benefits and alternatives for the proposed anesthesia with the patient or authorized representative who has indicated his/her understanding and acceptance.     Dental Advisory Given  Plan Discussed with: CRNA  Anesthesia Plan Comments:          Anesthesia Quick Evaluation

## 2024-03-01 NOTE — Interval H&P Note (Signed)
 History and Physical Interval Note:  03/01/2024 11:27 AM  Karen [name]  has presented today for surgery, with the diagnosis of combined forms age related cataract, left eye.  The various methods of treatment have been discussed with the patient and family. After consideration of risks, benefits and other options for treatment, the patient has consented to  Procedure(s) with comments: PHACOEMULSIFICATION, CATARACT, WITH IOL INSERTION (Left) - CDE: as a surgical intervention.  The patient's history has been reviewed, patient examined, no change in status, stable for surgery.  I have reviewed the patient's chart and labs.  Questions were answered to the patient's satisfaction.     HARRIE AGENT

## 2024-03-04 ENCOUNTER — Encounter (HOSPITAL_COMMUNITY): Payer: Self-pay | Admitting: Ophthalmology

## 2024-03-08 NOTE — Anesthesia Postprocedure Evaluation (Signed)
 Anesthesia Post Note  Patient: Karen [name]  Procedure(s) Performed: PHACOEMULSIFICATION, CATARACT, WITH IOL INSERTION (Left: Eye)  Patient location during evaluation: Phase II Anesthesia Type: MAC Level of consciousness: awake Pain management: pain level controlled Vital Signs Assessment: post-procedure vital signs reviewed and stable Respiratory status: spontaneous breathing and respiratory function stable Cardiovascular status: blood pressure returned to baseline and stable Postop Assessment: no headache and no apparent nausea or vomiting Anesthetic complications: no Comments: Late entry   No notable events documented.   Last Vitals:  Vitals:   03/01/24 1111 03/01/24 1152  BP: (!) 180/124 (!) 157/95  Pulse: 87 78  Resp: 20 20  Temp: 36.8 C 36.8 C  SpO2: 93% 98%    Last Pain:  Vitals:   03/01/24 1152  TempSrc: Oral  PainSc: 0-No pain                 Yvonna JINNY Bosworth

## 2024-03-19 DIAGNOSIS — I10 Essential (primary) hypertension: Secondary | ICD-10-CM | POA: Diagnosis not present

## 2024-03-26 DIAGNOSIS — Z79899 Other long term (current) drug therapy: Secondary | ICD-10-CM | POA: Diagnosis not present

## 2024-03-26 DIAGNOSIS — R7303 Prediabetes: Secondary | ICD-10-CM | POA: Diagnosis not present

## 2024-03-26 DIAGNOSIS — N3281 Overactive bladder: Secondary | ICD-10-CM | POA: Diagnosis not present

## 2024-03-26 DIAGNOSIS — I10 Essential (primary) hypertension: Secondary | ICD-10-CM | POA: Diagnosis not present

## 2024-03-26 DIAGNOSIS — R300 Dysuria: Secondary | ICD-10-CM | POA: Diagnosis not present

## 2024-03-26 DIAGNOSIS — E039 Hypothyroidism, unspecified: Secondary | ICD-10-CM | POA: Diagnosis not present

## 2024-03-26 DIAGNOSIS — E038 Other specified hypothyroidism: Secondary | ICD-10-CM | POA: Diagnosis not present

## 2024-04-01 DIAGNOSIS — H524 Presbyopia: Secondary | ICD-10-CM | POA: Diagnosis not present

## 2024-04-28 ENCOUNTER — Emergency Department (HOSPITAL_COMMUNITY)

## 2024-04-28 ENCOUNTER — Other Ambulatory Visit: Payer: Self-pay

## 2024-04-28 ENCOUNTER — Emergency Department (HOSPITAL_COMMUNITY)
Admission: EM | Admit: 2024-04-28 | Discharge: 2024-04-28 | Disposition: A | Discharge status: Home / Self Care | Attending: Emergency Medicine | Admitting: Emergency Medicine

## 2024-04-28 DIAGNOSIS — Z79899 Other long term (current) drug therapy: Secondary | ICD-10-CM | POA: Insufficient documentation

## 2024-04-28 DIAGNOSIS — M79641 Pain in right hand: Secondary | ICD-10-CM | POA: Insufficient documentation

## 2024-04-28 DIAGNOSIS — Z7901 Long term (current) use of anticoagulants: Secondary | ICD-10-CM | POA: Diagnosis not present

## 2024-04-28 DIAGNOSIS — S0033XA Contusion of nose, initial encounter: Secondary | ICD-10-CM | POA: Insufficient documentation

## 2024-04-28 DIAGNOSIS — Z853 Personal history of malignant neoplasm of breast: Secondary | ICD-10-CM | POA: Diagnosis not present

## 2024-04-28 DIAGNOSIS — S0990XA Unspecified injury of head, initial encounter: Secondary | ICD-10-CM | POA: Insufficient documentation

## 2024-04-28 DIAGNOSIS — M25531 Pain in right wrist: Secondary | ICD-10-CM | POA: Diagnosis not present

## 2024-04-28 DIAGNOSIS — I4891 Unspecified atrial fibrillation: Secondary | ICD-10-CM | POA: Insufficient documentation

## 2024-04-28 DIAGNOSIS — Z7982 Long term (current) use of aspirin: Secondary | ICD-10-CM | POA: Diagnosis not present

## 2024-04-28 DIAGNOSIS — I10 Essential (primary) hypertension: Secondary | ICD-10-CM | POA: Insufficient documentation

## 2024-04-28 DIAGNOSIS — M25562 Pain in left knee: Secondary | ICD-10-CM | POA: Insufficient documentation

## 2024-04-28 DIAGNOSIS — W19XXXA Unspecified fall, initial encounter: Secondary | ICD-10-CM

## 2024-04-28 DIAGNOSIS — W01198A Fall on same level from slipping, tripping and stumbling with subsequent striking against other object, initial encounter: Secondary | ICD-10-CM | POA: Diagnosis not present

## 2024-04-28 DIAGNOSIS — S0992XA Unspecified injury of nose, initial encounter: Secondary | ICD-10-CM | POA: Diagnosis present

## 2024-04-28 LAB — CBC WITH DIFFERENTIAL/PLATELET
Abs Immature Granulocytes: 0.05 K/uL (ref 0.00–0.07)
Basophils Absolute: 0 K/uL (ref 0.0–0.1)
Basophils Relative: 1 %
Eosinophils Absolute: 0.1 K/uL (ref 0.0–0.5)
Eosinophils Relative: 1 %
HCT: 41.5 % (ref 36.0–46.0)
Hemoglobin: 13.6 g/dL (ref 12.0–15.0)
Immature Granulocytes: 1 %
Lymphocytes Relative: 20 %
Lymphs Abs: 1.6 K/uL (ref 0.7–4.0)
MCH: 30.6 pg (ref 26.0–34.0)
MCHC: 32.8 g/dL (ref 30.0–36.0)
MCV: 93.3 fL (ref 80.0–100.0)
Monocytes Absolute: 0.6 K/uL (ref 0.1–1.0)
Monocytes Relative: 7 %
Neutro Abs: 5.9 K/uL (ref 1.7–7.7)
Neutrophils Relative %: 70 %
Platelets: 232 K/uL (ref 150–400)
RBC: 4.45 MIL/uL (ref 3.87–5.11)
RDW: 13.9 % (ref 11.5–15.5)
WBC: 8.2 K/uL (ref 4.0–10.5)
nRBC: 0 % (ref 0.0–0.2)

## 2024-04-28 LAB — COMPREHENSIVE METABOLIC PANEL WITH GFR
ALT: 10 U/L (ref 0–44)
AST: 20 U/L (ref 15–41)
Albumin: 4.1 g/dL (ref 3.5–5.0)
Alkaline Phosphatase: 111 U/L (ref 38–126)
Anion gap: 11 (ref 5–15)
BUN: 11 mg/dL (ref 8–23)
CO2: 28 mmol/L (ref 22–32)
Calcium: 9.3 mg/dL (ref 8.9–10.3)
Chloride: 102 mmol/L (ref 98–111)
Creatinine, Ser: 0.8 mg/dL (ref 0.44–1.00)
GFR, Estimated: >60 mL/min (ref >60)
Glucose, Bld: 88 mg/dL (ref 70–99)
Potassium: 3.4 mmol/L — ABNORMAL LOW (ref 3.5–5.1)
Sodium: 141 mmol/L (ref 135–145)
Total Bilirubin: 1 mg/dL (ref 0.0–1.2)
Total Protein: 7.4 g/dL (ref 6.5–8.1)

## 2024-04-28 LAB — CK: Total CK: 92 U/L (ref 38–234)

## 2024-04-28 MED ORDER — APIXABAN 5 MG PO TABS: 5.0000 mg | ORAL_TABLET | Freq: Two times a day (BID) | ORAL | 0 refills | Status: DC

## 2024-04-28 MED ORDER — ACETAMINOPHEN 500 MG PO TABS
1000.0000 mg | ORAL_TABLET | Freq: Once | ORAL | Status: AC
  Administered 2024-04-28: 1000 mg via ORAL
  Filled 2024-04-28: qty 2

## 2024-04-28 MED ORDER — METOPROLOL TARTRATE 25 MG PO TABS: 12.5000 mg | ORAL_TABLET | Freq: Two times a day (BID) | ORAL | 2 refills | Status: DC

## 2024-04-28 NOTE — Discharge Instructions (Signed)
 You were seen for your fall in the emergency department.  You were found to have a broken nose and atrial fibrillation.  At home, please start taking the metoprolol and Eliquis for your atrial fibrillation.  Ice your nose to limit the swelling  For your broken nose please refrain from blowing your nose as this could worsen the fracture  Check your MyChart online for the results of any tests that had not resulted by the time you left the emergency department.   Follow-up with your primary doctor in 2-3 days regarding your visit.  Follow-up with ear nose and throat doctor about your broken nose in 7 days.  Follow-up with A-fib clinic as soon as possible  Return immediately to the emergency department if you experience any of the following: Severe headache, bleeding, palpitations, dizziness, or any other concerning symptoms.    Thank you for visiting our Emergency Department. It was a pleasure taking care of you today.

## 2024-04-28 NOTE — ED Triage Notes (Signed)
 Pt arrived POV with c/o falling last night on her brick porch face first. Pt c/o nose, right pinky , back, and knees. Pt stated she got knocked out but she does not know how Karen [name]. Pt walked into triage. Bruise noted to right pinky and nose.

## 2024-04-28 NOTE — ED Provider Notes (Signed)
 [place] EMERGENCY DEPARTMENT AT [facility] Provider Note   CSN: [id number] Arrival date & time: 04/28/24  1135     Patient presents with: Karen [name]   Karen [name] is a 78 y.o. female.  {Add pertinent medical, surgical, social history, OB history to HPI:6569} 78 year old female with history of hypertension, arthritis, and breast cancer in remission who presents to the emergency department after a fall.  Patient reports that she was going onto her porch and got dizzy and fell.  Denies any other preceding symptoms.  Hit the front of her head and thinks she may have broken her nose.  Also is having right hand and wrist pain.  Unclear exactly how long she was on the ground for.  Has been ambulatory since.  Also reports left knee pain.  Not on blood thinners.  Last Tdap was 2024.       Prior to Admission medications   Medication Sig Start Date End Date Taking? Authorizing Provider  ALPRAZolam  (XANAX ) 1 MG tablet Take 1 tablet (1 mg total) by mouth 3 (three) times daily as needed for anxiety. 12/10/14   Landy Rome MATSU, MD  ascorbic acid  (VITAMIN C) 500 MG tablet Take 1 tablet (500 mg total) by mouth daily. 11/01/20   Maree, Pratik D, DO  aspirin  81 MG chewable tablet Chew 1 tablet (81 mg total) by mouth 2 (two) times daily. 07/26/19   Swinteck, Redell, MD  levothyroxine  (SYNTHROID , LEVOTHROID) 100 MCG tablet Take 100 mcg by mouth daily before breakfast.  08/29/14   [provider]  olmesartan -hydrochlorothiazide  (BENICAR  HCT) 40-25 MG tablet Take 1 tablet by mouth daily. 05/30/19   [provider]    Allergies: Sulfonamide derivatives and Morphine  and codeine    Review of Systems  Updated Vital Signs BP (!) 191/122   Pulse (!) 123   Temp 98.3 F (36.8 C) (Oral)   Resp 17   Ht 5' (1.524 m)   Wt 81.6 kg   SpO2 93%   BMI 35.15 kg/m   Physical Exam Constitutional:      General: She is not in acute distress.    Appearance: Normal appearance. She is not  ill-appearing.  HENT:     Head: Normocephalic.     Comments: Bruising to the bridge of nose    Right Ear: External ear normal.     Left Ear: External ear normal.     Nose:     Comments: No nasal septal hematoma    Mouth/Throat:     Mouth: Mucous membranes are moist.     Pharynx: Oropharynx is clear.  Eyes:     Extraocular Movements: Extraocular movements intact.     Conjunctiva/sclera: Conjunctivae normal.     Pupils: Pupils are equal, round, and reactive to light.  Neck:     Comments: C-spine midline tenderness to palpation at approximately C7 Cardiovascular:     Rate and Rhythm: Normal rate and regular rhythm.     Pulses: Normal pulses.     Heart sounds: Normal heart sounds.  Pulmonary:     Effort: Pulmonary effort is normal. No respiratory distress.     Breath sounds: Normal breath sounds.  Abdominal:     General: Abdomen is flat. There is no distension.     Palpations: Abdomen is soft. There is no mass.     Tenderness: There is no abdominal tenderness. There is no guarding.  Musculoskeletal:        General: No deformity. Normal range of motion.  Comments: No tenderness to palpation of midline thoracic spine.  Lumbar spine tenderness to palpation at approximately L5.  No step-offs palpated.  No tenderness to palpation of chest wall.  No bruising noted.  No tenderness to palpation of bilateral clavicles.  No tenderness to palpation, bruising, or deformities noted of bilateral shoulders, elbows, hips, knees, or ankles.  Tenderness palpation of right wrist and hand  Neurological:     General: No focal deficit present.     Mental Status: She is alert and oriented to person, place, and time. Mental status is at baseline.     Cranial Nerves: No cranial nerve deficit.     Sensory: No sensory deficit.     Motor: No weakness.     Comments: Able to ambulate without assistance     (all labs ordered are listed, but only abnormal results are displayed) Labs Reviewed - No data  to display  EKG: None  Radiology: No results found.  {Document cardiac monitor, telemetry assessment procedure when appropriate:32947} Procedures   Medications Ordered in the ED - No data to display    {Click here for ABCD2, HEART and other calculators REFRESH Note before signing:1}                              Medical Decision Making Amount and/or Complexity of Data Reviewed Labs: ordered. Radiology: ordered.  Risk OTC drugs. Prescription drug management.   ***  {Document critical care time when appropriate  Document review of labs and clinical decision tools ie CHADS2VASC2, etc  Document your independent review of radiology images and any outside records  Document your discussion with family members, caretakers and with consultants  Document social determinants of health affecting pt's care  Document your decision making why or why not admission, treatments were needed:32947:::1}   Final diagnoses:  None    ED Discharge Orders     None

## 2024-04-28 NOTE — ED Notes (Signed)
 Pt ambulated in ED without assistance. Pt denied any additional discomfort.

## 2024-05-07 ENCOUNTER — Encounter (HOSPITAL_COMMUNITY): Payer: Self-pay | Admitting: Internal Medicine

## 2024-05-07 ENCOUNTER — Ambulatory Visit (HOSPITAL_COMMUNITY)
Admission: RE | Admit: 2024-05-07 | Discharge: 2024-05-07 | Disposition: A | Discharge status: Home / Self Care | Source: Ambulatory Visit | Attending: Internal Medicine | Admitting: Internal Medicine

## 2024-05-07 ENCOUNTER — Other Ambulatory Visit (HOSPITAL_COMMUNITY): Payer: Self-pay

## 2024-05-07 VITALS — BP 160/110 | HR 74 | Ht 60.0 in | Wt 181.2 lb

## 2024-05-07 DIAGNOSIS — I4819 Other persistent atrial fibrillation: Secondary | ICD-10-CM

## 2024-05-07 DIAGNOSIS — I4891 Unspecified atrial fibrillation: Secondary | ICD-10-CM | POA: Diagnosis not present

## 2024-05-07 DIAGNOSIS — D6869 Other thrombophilia: Secondary | ICD-10-CM | POA: Diagnosis not present

## 2024-05-07 MED ORDER — OLMESARTAN MEDOXOMIL-HCTZ 40-25 MG PO TABS: 1.0000 | ORAL_TABLET | Freq: Every day | ORAL | 2 refills | Status: AC

## 2024-05-07 MED ORDER — METOPROLOL TARTRATE 25 MG PO TABS: 12.5000 mg | ORAL_TABLET | Freq: Two times a day (BID) | ORAL | 2 refills | Status: AC

## 2024-05-07 MED ORDER — APIXABAN 5 MG PO TABS: 5.0000 mg | ORAL_TABLET | Freq: Two times a day (BID) | ORAL | 6 refills | Status: AC

## 2024-05-07 NOTE — Patient Instructions (Addendum)
 Re-Start olmesartan -hydrochlorothiazide  (BENICAR  HCT) 40-25 MG once daily  Stop aspirin   You may go to any [facility] Location for your lab work:One month 06/07/24  [place] - [address] ([facility]) - [address] [phone] [address] ( our building 1 [address] floor)  [place] - [address] - [address] [employer]  - [address] ([facility]    If you have any lab test that is abnormal or we need to change your treatment, we will call you    Echocardiogram -- scheduling will call once insurance authorization received.

## 2024-05-07 NOTE — Progress Notes (Signed)
 Primary Care Physician: Shona Norleen PEDLAR, MD Primary Cardiologist: None Electrophysiologist: None     Referring Physician: ED     Karen [name] is a 78 y.o. female with a history of hypertension, coronary artery calcifications and aortic atherosclerosis by CT imaging, possible CVA, arthritis, and breast cancer in remission who presents for consultation in the [facility].  ED visit on 04/28/2024 due to ground-level fall found to be in new rate controlled A-fib.  Imaging revealed nasal bone fracture.  Patient is on Eliquis  for stroke prevention.  On evaluation today, patient is currently in A-fib.  Patient does not have cardiac awareness.  She does note some fatigue, but states this has been going on for about a year.  She lost her husband last October and just assumed it was related to depression regarding his loss.  No bleeding issues on Eliquis .  Today, she denies symptoms of palpitations, chest pain, shortness of breath, orthopnea, PND, lower extremity edema, dizziness, presyncope, syncope, bleeding, or neurologic sequela. The patient is tolerating medications without difficulties and is otherwise without complaint today.    she has a BMI of Body mass index is 35.39 kg/m.SABRA Filed Weights   05/07/24 0947  Weight: 82.2 kg    Current Outpatient Medications  Medication Sig Dispense Refill   ALPRAZolam  (XANAX ) 1 MG tablet Take 1 tablet (1 mg total) by mouth 3 (three) times daily as needed for anxiety. 90 tablet 5   ascorbic acid  (VITAMIN C) 500 MG tablet Take 1 tablet (500 mg total) by mouth daily. 30 tablet 0   levothyroxine  (SYNTHROID ) 112 MCG tablet Take 112 mcg by mouth daily before breakfast.     apixaban  (ELIQUIS ) 5 MG TABS tablet Take 1 tablet (5 mg total) by mouth 2 (two) times daily. 60 tablet 6   metoprolol  tartrate (LOPRESSOR ) 25 MG tablet Take 0.5 tablets (12.5 mg total) by mouth 2 (two) times daily. 60 tablet 2   olmesartan -hydrochlorothiazide   (BENICAR  HCT) 40-25 MG tablet Take 1 tablet by mouth daily. 90 tablet 2   No current facility-administered medications for this encounter.    Atrial Fibrillation Management history:  Previous antiarrhythmic drugs: None Previous cardioversions: None Previous ablations: None Anticoagulation history: Eliquis    ROS- All systems are reviewed and negative except as per the HPI above.  Physical Exam: BP (!) 160/110   Pulse 74   Ht 5' (1.524 m)   Wt 82.2 kg   BMI 35.39 kg/m   GEN: Well nourished, well developed in no acute distress NECK: No JVD; No carotid bruits CARDIAC: Irregularly irregular rate and rhythm, no murmurs, rubs, gallops RESPIRATORY:  Clear to auscultation without rales, wheezing or rhonchi  ABDOMEN: Soft, non-tender, non-distended EXTREMITIES:  No edema; No deformity   EKG today demonstrates  EKG Interpretation Date/Time:  Tuesday May 07 2024 09:49:47 EST Ventricular Rate:  74 PR Interval:    QRS Duration:  76 QT Interval:  366 QTC Calculation: 406 R Axis:   42  Text Interpretation: Atrial fibrillation Abnormal ECG When compared with ECG of 28-Apr-2024 14:37, PREVIOUS ECG IS PRESENT Confirmed by Terra Pac ([id number]) on 05/07/2024 10:08:26 AM    Echo N/A  ASSESSMENT & PLAN CHA2DS2-VASc Score = 7  The patient's score is based upon: CHF History: 0 HTN History: 1 Diabetes History: 0 Stroke History: 2 Vascular Disease History: 1 Age Score: 2 Gender Score: 1       ASSESSMENT AND PLAN: Persistent Atrial Fibrillation (ICD10:  I48.19) The patient's  CHA2DS2-VASc score is 7, indicating a 11.2% annual risk of stroke.    Patient is currently in A-fib.  Education provided about A-fib.  We discussed cardioversion as a treatment to try to restore sinus rhythm.  After discussion, patient does not wish to do any intervention at this time around the holidays.  She wishes to have a follow-up after the holidays to discuss the option again.  She recalls her  husband had to do multiple cardioversion procedures and does not know if she wants to proceed with doing one herself.  Continue Lopressor  12.5 mg twice daily.  Will order baseline echocardiogram.  Secondary Hypercoagulable State (ICD10:  D68.69) The patient is at significant risk for stroke/thromboembolism based upon her CHA2DS2-VASc Score of 7.  Continue Apixaban  (Eliquis ).  We discussed the reasoning behind anticoagulation in the setting of stroke prevention related to Afib. We discussed the benefits vs risks of anticoagulation. After discussion, patient would like to continue anticoagulation and understands the potential risks. Will refill Eliquis  5 mg BID and print order for patient to have CBC in 1 month.  Stop ASA.     Follow up 2 months A-fib clinic.   Terra Pac, [facility] [place]  Afib Clinic [address] [place], [place] [address] [phone]

## 2024-05-29 ENCOUNTER — Ambulatory Visit (HOSPITAL_COMMUNITY): Admission: RE | Admit: 2024-05-29 | Discharge: 2024-05-29 | Attending: Internal Medicine | Admitting: Internal Medicine

## 2024-05-29 ENCOUNTER — Ambulatory Visit: Admitting: Urology

## 2024-05-29 ENCOUNTER — Encounter: Payer: Self-pay | Admitting: Urology

## 2024-05-29 ENCOUNTER — Other Ambulatory Visit (HOSPITAL_COMMUNITY): Payer: Self-pay | Admitting: *Deleted

## 2024-05-29 VITALS — BP 185/82 | HR 81

## 2024-05-29 DIAGNOSIS — I4819 Other persistent atrial fibrillation: Secondary | ICD-10-CM | POA: Insufficient documentation

## 2024-05-29 DIAGNOSIS — D6869 Other thrombophilia: Secondary | ICD-10-CM | POA: Insufficient documentation

## 2024-05-29 DIAGNOSIS — N3281 Overactive bladder: Secondary | ICD-10-CM | POA: Diagnosis not present

## 2024-05-29 DIAGNOSIS — Z87440 Personal history of urinary (tract) infections: Secondary | ICD-10-CM | POA: Diagnosis not present

## 2024-05-29 DIAGNOSIS — R32 Unspecified urinary incontinence: Secondary | ICD-10-CM

## 2024-05-29 DIAGNOSIS — I10 Essential (primary) hypertension: Secondary | ICD-10-CM

## 2024-05-29 LAB — URINALYSIS, ROUTINE W REFLEX MICROSCOPIC
Bilirubin, UA: NEGATIVE
Glucose, UA: NEGATIVE
Ketones, UA: NEGATIVE
Leukocytes,UA: NEGATIVE
Nitrite, UA: NEGATIVE
Protein,UA: NEGATIVE
RBC, UA: NEGATIVE
Specific Gravity, UA: 1.01 (ref 1.005–1.030)
Urobilinogen, Ur: 0.2 mg/dL (ref 0.2–1.0)
pH, UA: 6 (ref 5.0–7.5)

## 2024-05-29 LAB — ECHOCARDIOGRAM COMPLETE
Area-P 1/2: 3.6 cm2
MV M vel: 5.81 m/s
MV Peak grad: 135 mmHg
S' Lateral: 2.8 cm

## 2024-05-29 LAB — BLADDER SCAN AMB NON-IMAGING: Scan Result: 0

## 2024-05-29 MED ORDER — FLUCONAZOLE 150 MG PO TABS: 150.0000 mg | ORAL_TABLET | Freq: Every day | ORAL | 0 refills | Status: DC

## 2024-05-29 MED ORDER — TROSPIUM CHLORIDE 20 MG PO TABS: 20.0000 mg | ORAL_TABLET | Freq: Two times a day (BID) | ORAL | 11 refills | Status: AC

## 2024-05-29 NOTE — Progress Notes (Signed)
° °  Patient can void prior to the bladder scan. Bladder scan result: 0  Performed By: [facility] lpn

## 2024-05-29 NOTE — Progress Notes (Signed)
 05/29/2024 1:23 PM   Karen [name] Oct 16, 1945 [id number]  Referring provider: Gladis Lauraine BRAVO, NP [address] [place],  [place] [address]  Urinary incontinence   HPI: Ms Plourde is a 78yo here for evaluation of urinary incontinence. For over 1 year she has noted increased urinary frequency, urgency and urge incontinence. She has nocturia 4x. She uses 8-10 pads per day and 5-6 pads at night. She has had 8 UTIs in the past year. She denies any numbness/tingling in her fingers or toes. No dysuria or gross hematuria.    PMH: Past Medical History:  Diagnosis Date   Arthritis    right hip   Breast cancer (HCC) 2004   bilateral mastectomy-all left lymph nodes removed; chemo. Patient states she had spot on the right breast as well.   Diverticulitis    GERD (gastroesophageal reflux disease)    since gallbladder removed, no more GERD   Goiter    HTN (hypertension)    Hypothyroidism    Mental disorder    Major depressive disorder; generalized anxiety disorder; mood disorder   Pneumonia    Sleep apnea    has never been tested.  was dx with cancer first and had this problem on the back burner   Tubular adenoma     Surgical History: Past Surgical History:  Procedure Laterality Date   ABDOMINAL HYSTERECTOMY     bilateral mastectomy w/reconstruction  2004   BREAST SURGERY     CATARACT EXTRACTION W/PHACO Right 02/16/2024   Procedure: PHACOEMULSIFICATION, CATARACT, WITH IOL INSERTION;  Surgeon: Harrie Agent, MD;  Location: [facility];  Service: Ophthalmology;  Laterality: Right;  CDE: 5.95   CATARACT EXTRACTION W/PHACO Left 03/01/2024   Procedure: PHACOEMULSIFICATION, CATARACT, WITH IOL INSERTION;  Surgeon: Harrie Agent, MD;  Location: [facility];  Service: Ophthalmology;  Laterality: Left;  CDE: 6.15   CHOLECYSTECTOMY     COLONOSCOPY  2008   colon with few diverticula in sigmoid colon   COLONOSCOPY  02/16/2011   Dr. Shaaron- colon and rectal polyps= tubular adenoma, L sided  diverticulosis.   DEBRIDEMENT AND CLOSURE WOUND N/A 12/17/2014   Procedure: CLOSURE WOUND LUMBAR SPINE;  Surgeon: Donaciano Sprang, MD;  Location: [facility];  Service: Orthopedics;  Laterality: N/A;   DILATION AND CURETTAGE OF UTERUS     ESOPHAGOGASTRODUODENOSCOPY  2008   lipoma distal esophagus, gastritis    ESOPHAGOGASTRODUODENOSCOPY  02/16/2011   Dr. Shaaron- normal esophagus, small hiatal hernia o/w normal stomach. normal first and second portion of the duodenum   HERNIA REPAIR     umbilical hernia was repaired   INJECTION KNEE Right 11/07/2014   Procedure: KNEE INJECTION;  Surgeon: Donaciano Sprang, MD;  Location: [facility];  Service: Orthopedics;  Laterality: Right;   LUMBAR LAMINECTOMY/DECOMPRESSION MICRODISCECTOMY N/A 09/25/2014   Procedure: DECOMPRESSION L3-L5 WITH INSERTION OF COFLEX   ( 2 LEVELS);  Surgeon: Donaciano Sprang, MD;  Location: [facility];  Service: Orthopedics;  Laterality: N/A;   LUMBAR LAMINECTOMY/DECOMPRESSION MICRODISCECTOMY N/A 11/07/2014   Procedure: IRRIGATION AND DEBRIDEMENT BACK WOUND, PLACEMENT OF ANTIBIOTIC BEADS;  Surgeon: Donaciano Sprang, MD;  [facility];  Service: Orthopedics;  Laterality: N/A;   LUMBAR WOUND DEBRIDEMENT N/A 12/01/2014   Procedure: IRRIGATION AND DEBRIDEMENT OF LUMBAR WOUND WITH VAC PLACEMENT;  Surgeon: Donaciano Sprang, MD;  [facility]: [facility];  Service: Orthopedics;  Laterality: N/A;   LUMBAR WOUND DEBRIDEMENT N/A 12/03/2014   Procedure: WOUND VAC DRESSING CHANGE ;  Surgeon: Donaciano Sprang, MD;  Location: [facility];  Service:  Orthopedics;  Laterality: N/A;   MALONEY DILATION  02/16/2011   Procedure: AGAPITO DILATION;  Surgeon: Lamar CHRISTELLA Hollingshead, MD;  [facility]: [facility];  Service: Endoscopy;  Laterality: N/A;   MASTECTOMY     bilateral 2004 left side had 17 nodes removed   PARTIAL HYSTERECTOMY     SAVORY DILATION  02/16/2011   Procedure: SAVORY DILATION;  Surgeon: Lamar CHRISTELLA Hollingshead, MD;  Location: [facility];  Service: Endoscopy;  Laterality: N/A;   TOTAL HIP ARTHROPLASTY  Right 02/29/2016   Procedure: TOTAL HIP ARTHROPLASTY ANTERIOR APPROACH;  Surgeon: Redell Shoals, MD;  Location: [facility];  Service: Orthopedics;  Laterality: Right;   TOTAL HIP ARTHROPLASTY Left 07/25/2019   Procedure: TOTAL HIP ARTHROPLASTY ANTERIOR APPROACH;  Surgeon: Shoals Redell, MD;  Location: [facility];  Service: Orthopedics;  Laterality: Left;    Home Medications:  Allergies as of 05/29/2024       Reactions   Sulfonamide Derivatives Anaphylaxis   Morphine  And Codeine Swelling   SWELLING REACTION UNSPECIFIED         Medication List        Accurate as of May 29, 2024  1:23 PM. If you have any questions, ask your nurse or doctor.          ALPRAZolam  1 MG tablet Commonly known as: XANAX  Take 1 tablet (1 mg total) by mouth 3 (three) times daily as needed for anxiety.   apixaban  5 MG Tabs tablet Commonly known as: ELIQUIS  Take 1 tablet (5 mg total) by mouth 2 (two) times daily.   ascorbic acid  500 MG tablet Commonly known as: VITAMIN C Take 1 tablet (500 mg total) by mouth daily.   levothyroxine  112 MCG tablet Commonly known as: SYNTHROID  Take 112 mcg by mouth daily before breakfast.   metoprolol  tartrate 25 MG tablet Commonly known as: LOPRESSOR  Take 0.5 tablets (12.5 mg total) by mouth 2 (two) times daily.   olmesartan -hydrochlorothiazide  40-25 MG tablet Commonly known as: BENICAR  HCT Take 1 tablet by mouth daily.        Allergies: Allergies[1]  Family History: Family History  Problem Relation Age of Onset   Pancreatic cancer Mother        deceased   Aneurysm Father        deceased, aortic   Breast cancer Sister        deceased 8 years after initial diagnosis   Breast cancer Sister        survivor   Diverticulitis Brother        s/p partial colon resection   Suicidality Brother    Cancer Son        lung   Colon cancer Neg Hx     Social History:  reports that she quit smoking about 28 years ago. Her smoking use included cigarettes. She  started smoking about 48 years ago. She has a 20 pack-year smoking history. She has never used smokeless tobacco. She reports that she does not drink alcohol  and does not use drugs.  ROS: All other review of systems were reviewed and are negative except what is noted above in HPI  Physical Exam: BP (!) 185/82   Pulse 81   Constitutional:  Alert and oriented, No acute distress. HEENT: [place] AT, moist mucus membranes.  Trachea midline, no masses. Cardiovascular: No clubbing, cyanosis, or edema. Respiratory: Normal respiratory effort, no increased work of breathing. GI: Abdomen is soft, nontender, nondistended, no abdominal masses GU: No CVA tenderness.  Lymph: No cervical or inguinal  lymphadenopathy. Skin: No rashes, bruises or suspicious lesions. Neurologic: Grossly intact, no focal deficits, moving all 4 extremities. Psychiatric: Normal mood and affect.  Laboratory Data: Lab Results  Component Value Date   WBC 8.2 04/28/2024   HGB 13.6 04/28/2024   HCT 41.5 04/28/2024   MCV 93.3 04/28/2024   PLT 232 04/28/2024    Lab Results  Component Value Date   CREATININE 0.80 04/28/2024    No results found for: PSA  No results found for: TESTOSTERONE  No results found for: HGBA1C  Urinalysis    Component Value Date/Time   COLORURINE YELLOW 10/30/2020 1856   APPEARANCEUR HAZY (A) 10/30/2020 1856   LABSPEC 1.021 10/30/2020 1856   PHURINE 5.0 10/30/2020 1856   GLUCOSEU NEGATIVE 10/30/2020 1856   HGBUR NEGATIVE 10/30/2020 1856   BILIRUBINUR NEGATIVE 10/30/2020 1856   KETONESUR NEGATIVE 10/30/2020 1856   PROTEINUR NEGATIVE 10/30/2020 1856   UROBILINOGEN 0.2 12/01/2014 2012   NITRITE NEGATIVE 10/30/2020 1856   LEUKOCYTESUR NEGATIVE 10/30/2020 1856    Lab Results  Component Value Date   BACTERIA RARE (A) 07/17/2019    Pertinent Imaging:  No results found for this or any previous visit.  No results found for this or any previous visit.  No results found for this or  any previous visit.  No results found for this or any previous visit.  No results found for this or any previous visit.  No results found for this or any previous visit.  No results found for this or any previous visit.  No results found for this or any previous visit.   Assessment & Plan:    1. OAB (overactive bladder) (Primary) -we will trial sanctura  20mg  BID - Urinalysis, Routine w reflex microscopic - BLADDER SCAN AMB NON-IMAGING     No follow-ups on file.  Belvie Clara, MD  [facility] [place]      [1]  Allergies Allergen Reactions   Sulfonamide Derivatives Anaphylaxis   Morphine  And Codeine Swelling    SWELLING REACTION UNSPECIFIED

## 2024-05-29 NOTE — Progress Notes (Addendum)
*  PRELIMINARY RESULTS* Echocardiogram 2D Echocardiogram has been performed. Patient's physician office informed of elevated BP. Patient states she is her usual self just tired and SOB. Informed patient to go to the E.D. if anything changed.  Karen [name] 05/29/2024, 12:07 PM

## 2024-05-29 NOTE — Patient Instructions (Signed)

## 2024-05-30 ENCOUNTER — Other Ambulatory Visit (HOSPITAL_COMMUNITY): Payer: Self-pay | Admitting: *Deleted

## 2024-05-30 ENCOUNTER — Ambulatory Visit (HOSPITAL_COMMUNITY): Payer: Self-pay | Admitting: Internal Medicine

## 2024-05-30 DIAGNOSIS — I4819 Other persistent atrial fibrillation: Secondary | ICD-10-CM

## 2024-05-30 DIAGNOSIS — I080 Rheumatic disorders of both mitral and aortic valves: Secondary | ICD-10-CM

## 2024-05-30 MED ORDER — AMLODIPINE BESYLATE 5 MG PO TABS: 5.0000 mg | ORAL_TABLET | Freq: Every day | ORAL | 2 refills | Status: AC

## 2024-05-30 NOTE — Progress Notes (Signed)
 Received called from [facility] echo yesterday pt BP elevated 206/118 HR 92. Instructed them give her ER precautions if she didn't feel well. I was able to to reach pt today. Discussed with J.Suarez P.A. he recommended HTN Clinic referral and amlodipine  5mg  daily.  I reviewed adding the new med and monitoring BP at home to give us  an update later. Pt agrees to plan.

## 2024-07-05 ENCOUNTER — Ambulatory Visit (HOSPITAL_COMMUNITY)
Admission: RE | Admit: 2024-07-05 | Discharge: 2024-07-05 | Disposition: A | Source: Ambulatory Visit | Attending: Internal Medicine | Admitting: Internal Medicine

## 2024-07-05 ENCOUNTER — Encounter (HOSPITAL_COMMUNITY): Payer: Self-pay | Admitting: Internal Medicine

## 2024-07-05 VITALS — BP 152/100 | HR 98 | Ht 60.0 in | Wt 181.6 lb

## 2024-07-05 DIAGNOSIS — D6869 Other thrombophilia: Secondary | ICD-10-CM | POA: Diagnosis not present

## 2024-07-05 DIAGNOSIS — I4819 Other persistent atrial fibrillation: Secondary | ICD-10-CM | POA: Diagnosis not present

## 2024-07-05 NOTE — H&P (View-Only) (Signed)
 "   Primary Care Physician: Karen Norleen PEDLAR, MD Primary Cardiologist: None Electrophysiologist: None     Referring Physician: ED     Karen [name] is a 79 y.o. female with a history of hypertension, coronary artery calcifications and aortic atherosclerosis by CT imaging, possible CVA, arthritis, and breast cancer in remission who presents for consultation in the [facility].  ED visit on 04/28/2024 due to ground-level fall found to be in new rate controlled A-fib.  Imaging revealed nasal bone fracture.  Patient is on Eliquis  for stroke prevention.  Follow-up 07/05/2024.  Patient is currently in A-fib.  She was seen in November and declined intervention at that time.  She notes some SOB with exertion and fatigue. No bleeding issues on Eliquis .  Patient notes that she was mistakenly taking Eliquis  only once a day.  She reread the instructions on the bottle and has been compliant with twice daily dosing for the past 2 weeks.  Today, she denies symptoms of palpitations, chest pain,  orthopnea, PND, lower extremity edema, dizziness, presyncope, syncope, bleeding, or neurologic sequela. The patient is tolerating medications without difficulties and is otherwise without complaint today.    she has a BMI of Body mass index is 35.47 kg/m.SABRA Filed Weights   07/05/24 1014  Weight: 82.4 kg     Current Outpatient Medications  Medication Sig Dispense Refill   ALPRAZolam  (XANAX ) 1 MG tablet Take 1 tablet (1 mg total) by mouth 3 (three) times daily as needed for anxiety. 90 tablet 5   amLODipine  (NORVASC ) 5 MG tablet Take 1 tablet (5 mg total) by mouth daily. 180 tablet 2   apixaban  (ELIQUIS ) 5 MG TABS tablet Take 1 tablet (5 mg total) by mouth 2 (two) times daily. 60 tablet 6   ascorbic acid  (VITAMIN C) 500 MG tablet Take 1 tablet (500 mg total) by mouth daily. 30 tablet 0   levothyroxine  (SYNTHROID ) 125 MCG tablet Take 125 mcg by mouth daily before breakfast.     metoprolol   tartrate (LOPRESSOR ) 25 MG tablet Take 0.5 tablets (12.5 mg total) by mouth 2 (two) times daily. 60 tablet 2   olmesartan -hydrochlorothiazide  (BENICAR  HCT) 40-25 MG tablet Take 1 tablet by mouth daily. 90 tablet 2   trospium  (SANCTURA ) 20 MG tablet Take 1 tablet (20 mg total) by mouth 2 (two) times daily. 60 tablet 11   Vitamin D, Ergocalciferol, (DRISDOL) 1.25 MG (50000 UNIT) CAPS capsule Take 50,000 Units by mouth once a week.     No current facility-administered medications for this encounter.    Atrial Fibrillation Management history:  Previous antiarrhythmic drugs: None Previous cardioversions: None Previous ablations: None Anticoagulation history: Eliquis    ROS- All systems are reviewed and negative except as per the HPI above.  Physical Exam: BP (!) 152/100   Pulse 98   Ht 5' (1.524 m)   Wt 82.4 kg   BMI 35.47 kg/m   GEN- The patient is well appearing, alert and oriented x 3 today.   Neck - no JVD or carotid bruit noted Lungs- Clear to ausculation bilaterally, normal work of breathing Heart- Irregular rate and rhythm, no murmurs, rubs or gallops, PMI not laterally displaced Extremities- no clubbing, cyanosis, or edema Skin - no rash or ecchymosis noted   EKG today demonstrates  EKG Interpretation Date/Time:  Friday July 05 2024 10:16:44 EST Ventricular Rate:  98 PR Interval:    QRS Duration:  78 QT Interval:  368 QTC Calculation: 469 R Axis:  46  Text Interpretation: Atrial fibrillation Abnormal ECG When compared with ECG of 07-May-2024 09:49, No significant change was found Confirmed by Terra Pac ([id number]) on 07/05/2024 10:17:47 AM     Echo 05/29/2024: 1. Left ventricular ejection fraction, by estimation, is 55 to 60%. The  left ventricle has normal function. The left ventricle has no regional  wall motion abnormalities. There is moderate asymmetric left ventricular  hypertrophy of the basal segment.  Left ventricular diastolic parameters are  indeterminate.   2. Right ventricular systolic function is low normal. The right  ventricular size is normal. There is moderately elevated pulmonary artery  systolic pressure. The estimated right ventricular systolic pressure is  46.0 mmHg.   3. Left atrial size was moderately dilated.   4. The mitral valve is degenerative. Mild to moderate mitral valve  regurgitation. Severe mitral annular calcification.   5. The aortic valve is tricuspid. There is mild calcification of the  aortic valve. Aortic valve regurgitation is not visualized. Aortic valve  sclerosis is present, with no evidence of aortic valve stenosis.   6. The inferior vena cava is normal in size with greater than 50%  respiratory variability, suggesting right atrial pressure of 3 mmHg.   ASSESSMENT & PLAN CHA2DS2-VASc Score = 7  The patient's score is based upon: CHF History: 0 HTN History: 1 Diabetes History: 0 Stroke History: 2 Vascular Disease History: 1 Age Score: 2 Gender Score: 1       ASSESSMENT AND PLAN: Persistent Atrial Fibrillation (ICD10:  I48.19) The patient's CHA2DS2-VASc score is 7, indicating a 11.2% annual risk of stroke.    Patient is currently in A-fib.  She is taking Lopressor  12.5 mg twice daily.  She did not want to do a cardioversion initially and waited until after the holidays to discuss options.  Discussed cardioversion as an initial procedure to try to restore sinus rhythm.  She noted her husband required multiple cardioversions and ultimately it did not work for him.  After discussion, patient wishes to proceed with cardioversion to try to restore sinus rhythm. We briefly last rhythm control options if patient has ER AF. We discussed the procedure cardioversion to try to convert to NSR. We discussed the risks vs benefits of this procedure and how ultimately we cannot predict whether a patient will have early return of arrhythmia post procedure. After discussion, the patient wishes to proceed with  cardioversion. Labs drawn today.  If patient has ER AF noted at upcoming cardiology appointment, would appreciate follow-up in the A-fib clinic to discuss rhythm control options.  Informed Consent   Shared Decision Making/Informed Consent The risks (stroke, cardiac arrhythmias rarely resulting in the need for a temporary or permanent pacemaker, skin irritation or burns and complications associated with conscious sedation including aspiration, arrhythmia, respiratory failure and death), benefits (restoration of normal sinus rhythm) and alternatives of a direct current cardioversion were explained in detail to Karen [name] and she agrees to proceed.        Secondary Hypercoagulable State (ICD10:  D68.69) The patient is at significant risk for stroke/thromboembolism based upon her CHA2DS2-VASc Score of 7.  Continue Apixaban  (Eliquis ).  Patient has been correctly taking Eliquis  5 mg twice daily for approximately the past 2 weeks.  Out of caution we will schedule the cardioversion to be after at least 3 weeks of uninterrupted anticoagulation.  Compliance with twice daily dosing emphasized.     Follow up as scheduled with cardiology after DCCV.   Terra Pac, PA-C  [facility]  [address] [place], [place] [address] [phone]  "

## 2024-07-05 NOTE — Patient Instructions (Addendum)
 Cardioversion scheduled for: 07/19/24 Friday at 8:30 am    - Arrive at the [facility] ([address]  and check in with ADMITTING at 8:30 am    - Do not eat or drink anything after midnight the night prior to your procedure.   - Take all your morning medication (except diabetic medications) with a sip of water  prior to arrival.  - Do NOT miss any doses of your blood thinner - if you should miss a dose or take a dose more than 4 hours late -- please notify our office immediately.  - You will not be able to drive home after your procedure. Please ensure you have a responsible adult to drive you home. You will need someone with you for 24 hours post procedure.     - Expect to be in the procedural area approximately 2 hours.   - If you feel as if you go back into normal rhythm prior to scheduled cardioversion, please notify our office immediately.   If your procedure is canceled in the cardioversion suite you will be charged a cancellation fee.

## 2024-07-05 NOTE — Progress Notes (Signed)
 "   Primary Care Physician: Karen Norleen PEDLAR, MD Primary Cardiologist: None Electrophysiologist: None     Referring Physician: ED     Karen [name] is a 79 y.o. female with a history of hypertension, coronary artery calcifications and aortic atherosclerosis by CT imaging, possible CVA, arthritis, and breast cancer in remission who presents for consultation in the [facility].  ED visit on 04/28/2024 due to ground-level fall found to be in new rate controlled A-fib.  Imaging revealed nasal bone fracture.  Patient is on Eliquis  for stroke prevention.  Follow-up 07/05/2024.  Patient is currently in A-fib.  She was seen in November and declined intervention at that time.  She notes some SOB with exertion and fatigue. No bleeding issues on Eliquis .  Patient notes that she was mistakenly taking Eliquis  only once a day.  She reread the instructions on the bottle and has been compliant with twice daily dosing for the past 2 weeks.  Today, she denies symptoms of palpitations, chest pain,  orthopnea, PND, lower extremity edema, dizziness, presyncope, syncope, bleeding, or neurologic sequela. The patient is tolerating medications without difficulties and is otherwise without complaint today.    she has a BMI of Body mass index is 35.47 kg/m.SABRA Filed Weights   07/05/24 1014  Weight: 82.4 kg     Current Outpatient Medications  Medication Sig Dispense Refill   ALPRAZolam  (XANAX ) 1 MG tablet Take 1 tablet (1 mg total) by mouth 3 (three) times daily as needed for anxiety. 90 tablet 5   amLODipine  (NORVASC ) 5 MG tablet Take 1 tablet (5 mg total) by mouth daily. 180 tablet 2   apixaban  (ELIQUIS ) 5 MG TABS tablet Take 1 tablet (5 mg total) by mouth 2 (two) times daily. 60 tablet 6   ascorbic acid  (VITAMIN C) 500 MG tablet Take 1 tablet (500 mg total) by mouth daily. 30 tablet 0   levothyroxine  (SYNTHROID ) 125 MCG tablet Take 125 mcg by mouth daily before breakfast.     metoprolol   tartrate (LOPRESSOR ) 25 MG tablet Take 0.5 tablets (12.5 mg total) by mouth 2 (two) times daily. 60 tablet 2   olmesartan -hydrochlorothiazide  (BENICAR  HCT) 40-25 MG tablet Take 1 tablet by mouth daily. 90 tablet 2   trospium  (SANCTURA ) 20 MG tablet Take 1 tablet (20 mg total) by mouth 2 (two) times daily. 60 tablet 11   Vitamin D, Ergocalciferol, (DRISDOL) 1.25 MG (50000 UNIT) CAPS capsule Take 50,000 Units by mouth once a week.     No current facility-administered medications for this encounter.    Atrial Fibrillation Management history:  Previous antiarrhythmic drugs: None Previous cardioversions: None Previous ablations: None Anticoagulation history: Eliquis    ROS- All systems are reviewed and negative except as per the HPI above.  Physical Exam: BP (!) 152/100   Pulse 98   Ht 5' (1.524 m)   Wt 82.4 kg   BMI 35.47 kg/m   GEN- The patient is well appearing, alert and oriented x 3 today.   Neck - no JVD or carotid bruit noted Lungs- Clear to ausculation bilaterally, normal work of breathing Heart- Irregular rate and rhythm, no murmurs, rubs or gallops, PMI not laterally displaced Extremities- no clubbing, cyanosis, or edema Skin - no rash or ecchymosis noted   EKG today demonstrates  EKG Interpretation Date/Time:  Friday July 05 2024 10:16:44 EST Ventricular Rate:  98 PR Interval:    QRS Duration:  78 QT Interval:  368 QTC Calculation: 469 R Axis:  46  Text Interpretation: Atrial fibrillation Abnormal ECG When compared with ECG of 07-May-2024 09:49, No significant change was found Confirmed by Terra Pac ([id number]) on 07/05/2024 10:17:47 AM     Echo 05/29/2024: 1. Left ventricular ejection fraction, by estimation, is 55 to 60%. The  left ventricle has normal function. The left ventricle has no regional  wall motion abnormalities. There is moderate asymmetric left ventricular  hypertrophy of the basal segment.  Left ventricular diastolic parameters are  indeterminate.   2. Right ventricular systolic function is low normal. The right  ventricular size is normal. There is moderately elevated pulmonary artery  systolic pressure. The estimated right ventricular systolic pressure is  46.0 mmHg.   3. Left atrial size was moderately dilated.   4. The mitral valve is degenerative. Mild to moderate mitral valve  regurgitation. Severe mitral annular calcification.   5. The aortic valve is tricuspid. There is mild calcification of the  aortic valve. Aortic valve regurgitation is not visualized. Aortic valve  sclerosis is present, with no evidence of aortic valve stenosis.   6. The inferior vena cava is normal in size with greater than 50%  respiratory variability, suggesting right atrial pressure of 3 mmHg.   ASSESSMENT & PLAN CHA2DS2-VASc Score = 7  The patient's score is based upon: CHF History: 0 HTN History: 1 Diabetes History: 0 Stroke History: 2 Vascular Disease History: 1 Age Score: 2 Gender Score: 1       ASSESSMENT AND PLAN: Persistent Atrial Fibrillation (ICD10:  I48.19) The patient's CHA2DS2-VASc score is 7, indicating a 11.2% annual risk of stroke.    Patient is currently in A-fib.  She is taking Lopressor  12.5 mg twice daily.  She did not want to do a cardioversion initially and waited until after the holidays to discuss options.  Discussed cardioversion as an initial procedure to try to restore sinus rhythm.  She noted her husband required multiple cardioversions and ultimately it did not work for him.  After discussion, patient wishes to proceed with cardioversion to try to restore sinus rhythm. We briefly last rhythm control options if patient has ER AF. We discussed the procedure cardioversion to try to convert to NSR. We discussed the risks vs benefits of this procedure and how ultimately we cannot predict whether a patient will have early return of arrhythmia post procedure. After discussion, the patient wishes to proceed with  cardioversion. Labs drawn today.  If patient has ER AF noted at upcoming cardiology appointment, would appreciate follow-up in the A-fib clinic to discuss rhythm control options.  Informed Consent   Shared Decision Making/Informed Consent The risks (stroke, cardiac arrhythmias rarely resulting in the need for a temporary or permanent pacemaker, skin irritation or burns and complications associated with conscious sedation including aspiration, arrhythmia, respiratory failure and death), benefits (restoration of normal sinus rhythm) and alternatives of a direct current cardioversion were explained in detail to Ms. Earnhart and she agrees to proceed.        Secondary Hypercoagulable State (ICD10:  D68.69) The patient is at significant risk for stroke/thromboembolism based upon her CHA2DS2-VASc Score of 7.  Continue Apixaban  (Eliquis ).  Patient has been correctly taking Eliquis  5 mg twice daily for approximately the past 2 weeks.  Out of caution we will schedule the cardioversion to be after at least 3 weeks of uninterrupted anticoagulation.  Compliance with twice daily dosing emphasized.     Follow up as scheduled with cardiology after DCCV.   Terra Pac, PA-C  [facility]  [address] [place], [place] [address] [phone]  "

## 2024-07-06 LAB — CBC
Hematocrit: 41 % (ref 34.0–46.6)
Hemoglobin: 13.1 g/dL (ref 11.1–15.9)
MCH: 30.8 pg (ref 26.6–33.0)
MCHC: 32 g/dL (ref 31.5–35.7)
MCV: 96 fL (ref 79–97)
Platelets: 248 10*3/uL (ref 150–450)
RBC: 4.26 x10E6/uL (ref 3.77–5.28)
RDW: 14.4 % (ref 11.7–15.4)
WBC: 8.7 10*3/uL (ref 3.4–10.8)

## 2024-07-06 LAB — BASIC METABOLIC PANEL WITH GFR
BUN/Creatinine Ratio: 14 (ref 12–28)
BUN: 13 mg/dL (ref 8–27)
CO2: 28 mmol/L (ref 20–29)
Calcium: 9.7 mg/dL (ref 8.7–10.3)
Chloride: 100 mmol/L (ref 96–106)
Creatinine, Ser: 0.94 mg/dL (ref 0.57–1.00)
Glucose: 91 mg/dL (ref 70–99)
Potassium: 3.5 mmol/L (ref 3.5–5.2)
Sodium: 145 mmol/L — ABNORMAL HIGH (ref 134–144)
eGFR: 62 mL/min/{1.73_m2}

## 2024-07-09 ENCOUNTER — Ambulatory Visit (HOSPITAL_COMMUNITY): Payer: Self-pay | Admitting: Internal Medicine

## 2024-07-18 NOTE — Anesthesia Preprocedure Evaluation (Signed)
 "                                  Anesthesia Evaluation  Patient identified by MRN, date of birth, ID band Patient awake    Reviewed: Allergy & Precautions, NPO status , Patient's Chart, lab work & pertinent test results  History of Anesthesia Complications Negative for: history of anesthetic complications  Airway Mallampati: III  TM Distance: >3 FB Neck ROM: Full    Dental  (+) Dental Advisory Given   Pulmonary neg shortness of breath, sleep apnea , neg COPD, neg recent URI, former smoker   Pulmonary exam normal breath sounds clear to auscultation       Cardiovascular hypertension (amlodipine , metoprolol , olmesartan -HCTZ), Pt. on medications (-) angina (-) Past MI, (-) Cardiac Stents and (-) CABG + dysrhythmias Atrial Fibrillation + Valvular Problems/Murmurs (mild-to-moderate MR)  Rhythm:Irregular Rate:Normal  TTE 05/29/2024: IMPRESSIONS    1. Left ventricular ejection fraction, by estimation, is 55 to 60%. The  left ventricle has normal function. The left ventricle has no regional  wall motion abnormalities. There is moderate asymmetric left ventricular  hypertrophy of the basal segment.  Left ventricular diastolic parameters are indeterminate.   2. Right ventricular systolic function is low normal. The right  ventricular size is normal. There is moderately elevated pulmonary artery  systolic pressure. The estimated right ventricular systolic pressure is  46.0 mmHg.   3. Left atrial size was moderately dilated.   4. The mitral valve is degenerative. Mild to moderate mitral valve  regurgitation. Severe mitral annular calcification.   5. The aortic valve is tricuspid. There is mild calcification of the  aortic valve. Aortic valve regurgitation is not visualized. Aortic valve  sclerosis is present, with no evidence of aortic valve stenosis.   6. The inferior vena cava is normal in size with greater than 50%  respiratory variability, suggesting right atrial  pressure of 3 mmHg.     Neuro/Psych neg Seizures PSYCHIATRIC DISORDERS Anxiety Depression     Neuromuscular disease (spinal stenosis)    GI/Hepatic Neg liver ROS,GERD  ,,Diverticulosis    Endo/Other  neg diabetesHypothyroidism  Goiter   Renal/GU negative Renal ROS     Musculoskeletal  (+) Arthritis ,    Abdominal   Peds  Hematology negative hematology ROS (+) Lab Results      Component                Value               Date                      WBC                      8.7                 07/05/2024                HGB                      13.1                07/05/2024                HCT                      41.0  07/05/2024                MCV                      96                  07/05/2024                PLT                      248                 07/05/2024              Anesthesia Other Findings   Reproductive/Obstetrics H/o breast cancer                              Anesthesia Physical Anesthesia Plan  ASA: 3  Anesthesia Plan: General   Post-op Pain Management: Minimal or no pain anticipated   Induction: Intravenous  PONV Risk Score and Plan: 3 and Treatment may vary due to age or medical condition  Airway Management Planned: Natural Airway and Nasal Cannula  Additional Equipment:   Intra-op Plan:   Post-operative Plan:   Informed Consent: I have reviewed the patients History and Physical, chart, labs and discussed the procedure including the risks, benefits and alternatives for the proposed anesthesia with the patient or authorized representative who has indicated his/her understanding and acceptance.     Dental advisory given  Plan Discussed with: CRNA and Anesthesiologist  Anesthesia Plan Comments: (Risks of general anesthesia discussed including, but not limited to, sore throat, hoarse voice, chipped/damaged teeth, injury to vocal cords, nausea and vomiting, allergic reactions, lung infection, heart  attack, stroke, and death. All questions answered. )         Anesthesia Quick Evaluation  "

## 2024-07-19 ENCOUNTER — Other Ambulatory Visit: Payer: Self-pay

## 2024-07-19 ENCOUNTER — Ambulatory Visit (HOSPITAL_COMMUNITY)
Admission: RE | Admit: 2024-07-19 | Discharge: 2024-07-19 | Disposition: A | Discharge status: Home / Self Care | Source: Home / Self Care | Attending: Internal Medicine | Admitting: Internal Medicine

## 2024-07-19 ENCOUNTER — Encounter (HOSPITAL_COMMUNITY): Payer: Self-pay | Admitting: Anesthesiology

## 2024-07-19 ENCOUNTER — Encounter (HOSPITAL_COMMUNITY)
Admission: RE | Disposition: A | Discharge status: Home / Self Care | Payer: Self-pay | Source: Home / Self Care | Attending: Internal Medicine

## 2024-07-19 ENCOUNTER — Encounter (HOSPITAL_COMMUNITY): Payer: Self-pay | Admitting: Internal Medicine

## 2024-07-19 DIAGNOSIS — I4819 Other persistent atrial fibrillation: Secondary | ICD-10-CM

## 2024-07-19 DIAGNOSIS — Z006 Encounter for examination for normal comparison and control in clinical research program: Secondary | ICD-10-CM

## 2024-07-19 MED ORDER — PROPOFOL 10 MG/ML IV BOLUS
INTRAVENOUS | Status: DC | PRN
  Administered 2024-07-19: 70 mg via INTRAVENOUS

## 2024-07-19 MED ORDER — SODIUM CHLORIDE 0.9 % IV SOLN: INTRAVENOUS | Status: DC

## 2024-07-19 MED ORDER — LIDOCAINE HCL (PF) 2 % IJ SOLN
INTRAMUSCULAR | Status: DC | PRN
  Administered 2024-07-19: 60 mg via INTRADERMAL

## 2024-07-19 NOTE — Transfer of Care (Signed)
 Immediate Anesthesia Transfer of Care Note  Patient: Karen [name]  Procedure(s) Performed: CARDIOVERSION  Patient Location: Cath Lab  Anesthesia Type:General  Level of Consciousness: drowsy and patient cooperative  Airway & Oxygen Therapy: Patient Spontanous Breathing and Patient connected to nasal cannula oxygen  Post-op Assessment: Report given to RN, Post -op Vital signs reviewed and stable, Patient moving all extremities, and Patient moving all extremities X 4  Post vital signs: Reviewed and stable  Last Vitals:  Vitals Value Taken Time  BP 156/91 07/19/24  08:47  Temp    Pulse 82 07/19/24 08:47  Resp 25 07/19/24 08:47  SpO2 91 % 07/19/24 08:47  Vitals shown include unfiled device data.  Last Pain:  Vitals:   07/19/24 0830  TempSrc: Temporal  PainSc: 0-No pain         Complications: No notable events documented.

## 2024-07-19 NOTE — Research (Cosign Needed)
 [facility] Cardioversion Informed Consent   Subject Name: Karen [name]  Subject met inclusion and exclusion criteria.  The informed consent form, study requirements and expectations were reviewed with the subject and questions and concerns were addressed prior to the signing of the consent form.  The subject verbalized understanding of the trial requirements.  The subject agreed to participate in the [facility] Cardioversion trial and signed the informed consent at 0823 on 06/Feb/2026.  The informed consent was obtained prior to performance of any protocol-specific procedures for the subject.  A copy of the signed informed consent was given to the subject and a copy was placed in the subject's medical record.   Karen [name] Silvio Sausedo

## 2024-07-19 NOTE — Interval H&P Note (Signed)
 History and Physical Interval Note:  07/19/2024 8:17 AM  Karen [name]  has presented today for surgery, with the diagnosis of AFIB.  The various methods of treatment have been discussed with the patient and family. After consideration of risks, benefits and other options for treatment, the patient has consented to  Procedures: CARDIOVERSION (N/A) as a surgical intervention.  The patient's history has been reviewed, patient examined, no change in status, stable for surgery.  I have reviewed the patient's chart and labs.  Questions were answered to the patient's satisfaction.     [name]

## 2024-07-19 NOTE — CV Procedure (Signed)
" ° ° °  CARDIOVERSION NOTE  Procedure: Electrical Cardioversion Indications:  Atrial Fibrillation  Procedure Details:  Consent: Risks of procedure as well as the alternatives and risks of each were explained to the (patient/caregiver).  Consent for procedure obtained.  Time Out: Verified patient identification, verified procedure, site/side was marked, verified correct patient position, special equipment/implants available, medications/allergies/relevent history reviewed, required imaging and test results available.  Performed  Patient placed on cardiac monitor, pulse oximetry, supplemental oxygen as necessary.  Sedation given: propofol  per anesthesia Pacer pads placed anterior and posterior chest.  Cardioverted 1 time(s).  Cardioverted at 200J biphasic.  Impression: Findings: Post procedure EKG shows: NSR Complications: None Patient did tolerate procedure well.  Plan: Successful DCCV with a single 200J biphasic shock to NSR.  Time Spent Directly with the Patient:  30 minutes   Vinie KYM Maxcy, MD, [facility], FNLA, FACP  [place]  [facility]  Medical Director of the Advanced Lipid Disorders &  Cardiovascular Risk Reduction Clinic Diplomate of the American Board of Clinical Lipidology Attending Cardiologist  Direct Dial: [phone]  Fax: [phone]  Website:  [email or web address] Vinie BROCKS Tenya Araque 07/19/2024, 8:58 AM     "

## 2024-07-19 NOTE — Anesthesia Postprocedure Evaluation (Signed)
"   Anesthesia Post Note  Patient: Karen [name]  Procedure(s) Performed: CARDIOVERSION     Patient location during evaluation: PACU Anesthesia Type: General Level of consciousness: awake Pain management: pain level controlled Vital Signs Assessment: post-procedure vital signs reviewed and stable Respiratory status: spontaneous breathing, nonlabored ventilation and respiratory function stable Cardiovascular status: blood pressure returned to baseline and stable Postop Assessment: no apparent nausea or vomiting Anesthetic complications: no   No notable events documented.  Last Vitals:  Vitals:   07/19/24 0900 07/19/24 0910  BP: (!) 156/91 (!) 155/89  Pulse: 80 78  Resp: 20 15  Temp:    SpO2: 92% 98%    Last Pain:  Vitals:   07/19/24 0910  TempSrc:   PainSc: 0-No pain                 [name]      "

## 2024-08-05 ENCOUNTER — Institutional Professional Consult (permissible substitution) (HOSPITAL_BASED_OUTPATIENT_CLINIC_OR_DEPARTMENT_OTHER): Admitting: Family

## 2024-12-11 ENCOUNTER — Ambulatory Visit: Admitting: Urology
